# Patient Record
Sex: Female | Born: 1955 | Race: White | Hispanic: No | Smoking: Never smoker
Health system: Southern US, Community
[De-identification: ages and names within clinical notes are randomized; demographics above are authoritative.]

## PROBLEM LIST (undated history)

## (undated) DIAGNOSIS — K219 Gastro-esophageal reflux disease without esophagitis: Secondary | ICD-10-CM

## (undated) DIAGNOSIS — A4902 Methicillin resistant Staphylococcus aureus infection, unspecified site: Secondary | ICD-10-CM

## (undated) DIAGNOSIS — J3800 Paralysis of vocal cords and larynx, unspecified: Secondary | ICD-10-CM

## (undated) DIAGNOSIS — I4729 Other ventricular tachycardia: Secondary | ICD-10-CM

## (undated) DIAGNOSIS — I442 Atrioventricular block, complete: Secondary | ICD-10-CM

## (undated) DIAGNOSIS — J45909 Unspecified asthma, uncomplicated: Secondary | ICD-10-CM

## (undated) DIAGNOSIS — N760 Acute vaginitis: Secondary | ICD-10-CM

## (undated) DIAGNOSIS — Z950 Presence of cardiac pacemaker: Secondary | ICD-10-CM

## (undated) DIAGNOSIS — I472 Ventricular tachycardia: Secondary | ICD-10-CM

## (undated) HISTORY — DX: Gastro-esophageal reflux disease without esophagitis: K21.9

## (undated) HISTORY — DX: Unspecified asthma, uncomplicated: J45.909

## (undated) HISTORY — DX: Acute vaginitis: N76.0

## (undated) HISTORY — DX: Other ventricular tachycardia: I47.29

## (undated) HISTORY — DX: Ventricular tachycardia: I47.2

## (undated) HISTORY — PX: FRACTURE SURGERY: SHX138

## (undated) HISTORY — DX: Paralysis of vocal cords and larynx, unspecified: J38.00

## (undated) HISTORY — PX: HAND SURGERY: SHX662

## (undated) HISTORY — DX: Presence of cardiac pacemaker: Z95.0

## (undated) HISTORY — DX: Atrioventricular block, complete: I44.2

## (undated) HISTORY — PX: COLON SURGERY: SHX602

---

## 1976-04-03 HISTORY — PX: DILATION AND CURETTAGE OF UTERUS: SHX78

## 1998-10-15 ENCOUNTER — Ambulatory Visit (HOSPITAL_BASED_OUTPATIENT_CLINIC_OR_DEPARTMENT_OTHER): Admission: RE | Admit: 1998-10-15 | Discharge: 1998-10-15 | Payer: Self-pay | Admitting: Orthopedic Surgery

## 1999-06-21 ENCOUNTER — Encounter: Payer: Self-pay | Admitting: Obstetrics and Gynecology

## 1999-06-21 ENCOUNTER — Encounter: Admission: RE | Admit: 1999-06-21 | Discharge: 1999-06-21 | Payer: Self-pay | Admitting: Obstetrics and Gynecology

## 2000-06-06 ENCOUNTER — Other Ambulatory Visit: Admission: RE | Admit: 2000-06-06 | Discharge: 2000-06-06 | Payer: Self-pay | Admitting: Obstetrics and Gynecology

## 2000-11-21 ENCOUNTER — Other Ambulatory Visit: Admission: RE | Admit: 2000-11-21 | Discharge: 2000-11-21 | Payer: Self-pay | Admitting: Obstetrics and Gynecology

## 2000-11-30 ENCOUNTER — Encounter: Payer: Self-pay | Admitting: Obstetrics and Gynecology

## 2000-11-30 ENCOUNTER — Encounter: Admission: RE | Admit: 2000-11-30 | Discharge: 2000-11-30 | Payer: Self-pay | Admitting: Obstetrics and Gynecology

## 2001-07-22 ENCOUNTER — Other Ambulatory Visit: Admission: RE | Admit: 2001-07-22 | Discharge: 2001-07-22 | Payer: Self-pay | Admitting: Obstetrics and Gynecology

## 2002-08-13 ENCOUNTER — Other Ambulatory Visit: Admission: RE | Admit: 2002-08-13 | Discharge: 2002-08-13 | Payer: Self-pay | Admitting: Obstetrics and Gynecology

## 2003-04-04 HISTORY — PX: OTHER SURGICAL HISTORY: SHX169

## 2003-09-22 ENCOUNTER — Other Ambulatory Visit: Admission: RE | Admit: 2003-09-22 | Discharge: 2003-09-22 | Payer: Self-pay | Admitting: Obstetrics and Gynecology

## 2004-04-19 ENCOUNTER — Ambulatory Visit: Payer: Self-pay | Admitting: Internal Medicine

## 2004-06-29 ENCOUNTER — Other Ambulatory Visit: Admission: RE | Admit: 2004-06-29 | Discharge: 2004-06-29 | Payer: Self-pay | Admitting: Obstetrics and Gynecology

## 2004-08-26 ENCOUNTER — Ambulatory Visit: Payer: Self-pay | Admitting: Internal Medicine

## 2004-10-03 ENCOUNTER — Ambulatory Visit: Payer: Self-pay | Admitting: Internal Medicine

## 2004-12-12 ENCOUNTER — Other Ambulatory Visit: Admission: RE | Admit: 2004-12-12 | Discharge: 2004-12-12 | Payer: Self-pay | Admitting: Obstetrics and Gynecology

## 2005-01-13 ENCOUNTER — Ambulatory Visit: Payer: Self-pay | Admitting: Internal Medicine

## 2005-06-02 ENCOUNTER — Ambulatory Visit: Payer: Self-pay | Admitting: Internal Medicine

## 2005-07-27 ENCOUNTER — Ambulatory Visit: Payer: Self-pay | Admitting: Internal Medicine

## 2005-10-06 ENCOUNTER — Ambulatory Visit: Payer: Self-pay | Admitting: Internal Medicine

## 2005-12-07 ENCOUNTER — Ambulatory Visit: Payer: Self-pay | Admitting: Internal Medicine

## 2006-04-27 ENCOUNTER — Ambulatory Visit: Payer: Self-pay | Admitting: Internal Medicine

## 2006-09-05 ENCOUNTER — Ambulatory Visit: Payer: Self-pay | Admitting: Internal Medicine

## 2006-10-30 ENCOUNTER — Ambulatory Visit: Payer: Self-pay | Admitting: Internal Medicine

## 2007-02-06 ENCOUNTER — Ambulatory Visit: Payer: Self-pay | Admitting: Internal Medicine

## 2007-06-05 DIAGNOSIS — J209 Acute bronchitis, unspecified: Secondary | ICD-10-CM | POA: Insufficient documentation

## 2007-06-05 DIAGNOSIS — K219 Gastro-esophageal reflux disease without esophagitis: Secondary | ICD-10-CM

## 2007-06-05 DIAGNOSIS — J3800 Paralysis of vocal cords and larynx, unspecified: Secondary | ICD-10-CM

## 2007-06-05 DIAGNOSIS — J302 Other seasonal allergic rhinitis: Secondary | ICD-10-CM | POA: Insufficient documentation

## 2007-06-06 ENCOUNTER — Ambulatory Visit: Payer: Self-pay | Admitting: Internal Medicine

## 2007-09-30 ENCOUNTER — Encounter: Payer: Self-pay | Admitting: Infectious Disease

## 2007-10-02 ENCOUNTER — Encounter: Admission: RE | Admit: 2007-10-02 | Discharge: 2007-10-02 | Payer: Self-pay | Admitting: Infectious Disease

## 2007-10-02 ENCOUNTER — Ambulatory Visit: Payer: Self-pay | Admitting: Infectious Disease

## 2007-10-02 DIAGNOSIS — N760 Acute vaginitis: Secondary | ICD-10-CM | POA: Insufficient documentation

## 2007-10-02 DIAGNOSIS — B3781 Candidal esophagitis: Secondary | ICD-10-CM | POA: Insufficient documentation

## 2007-10-02 DIAGNOSIS — N898 Other specified noninflammatory disorders of vagina: Secondary | ICD-10-CM | POA: Insufficient documentation

## 2007-10-02 LAB — CONVERTED CEMR LAB
ALT: 28 units/L (ref 0–35)
AST: 26 units/L (ref 0–37)
Albumin: 4.2 g/dL (ref 3.5–5.2)
Alkaline Phosphatase: 89 units/L (ref 39–117)
BUN: 20 mg/dL (ref 6–23)
C3 Complement: 147 mg/dL (ref 88–201)
CO2: 27 meq/L (ref 19–32)
Calcium: 9.7 mg/dL (ref 8.4–10.5)
Chlamydia, DNA Probe: NEGATIVE
Chloride: 104 meq/L (ref 96–112)
Compl, Total (CH50): 61 (ref 31–66)
Complement C4, Body Fluid: 21 mg/dL (ref 16–47)
Creatinine, Ser: 1 mg/dL (ref 0.40–1.20)
GC Probe Amp, Genital: NEGATIVE
Glucose, Bld: 90 mg/dL (ref 70–99)
IgA: 303 mg/dL (ref 68–378)
IgE (Immunoglobulin E), Serum: 38.6 intl units/mL (ref 0.0–180.0)
IgG (Immunoglobin G), Serum: 1400 mg/dL (ref 694–1618)
IgM, Serum: 114 mg/dL (ref 60–263)
Potassium: 4.3 meq/L (ref 3.5–5.3)
Sodium: 140 meq/L (ref 135–145)
Total Bilirubin: 0.3 mg/dL (ref 0.3–1.2)
Total Protein: 7.5 g/dL (ref 6.0–8.3)

## 2007-10-07 LAB — CONVERTED CEMR LAB
Candida species: NEGATIVE
Gardnerella vaginalis: POSITIVE — AB
Trichomonal Vaginitis: NEGATIVE

## 2007-11-27 ENCOUNTER — Ambulatory Visit: Payer: Self-pay | Admitting: Infectious Disease

## 2007-11-28 ENCOUNTER — Encounter: Payer: Self-pay | Admitting: Infectious Disease

## 2007-11-28 LAB — CONVERTED CEMR LAB
Candida species: NEGATIVE
Trichomonal Vaginitis: NEGATIVE

## 2007-12-26 ENCOUNTER — Ambulatory Visit: Payer: Self-pay | Admitting: Internal Medicine

## 2008-03-11 ENCOUNTER — Encounter: Payer: Self-pay | Admitting: Internal Medicine

## 2008-07-09 ENCOUNTER — Ambulatory Visit: Payer: Self-pay | Admitting: Internal Medicine

## 2008-08-03 ENCOUNTER — Encounter: Admission: RE | Admit: 2008-08-03 | Discharge: 2008-08-03 | Payer: Self-pay | Admitting: Cardiology

## 2008-08-05 ENCOUNTER — Observation Stay (HOSPITAL_COMMUNITY): Admission: RE | Admit: 2008-08-05 | Discharge: 2008-08-06 | Payer: Self-pay | Admitting: Cardiology

## 2008-08-05 DIAGNOSIS — Z950 Presence of cardiac pacemaker: Secondary | ICD-10-CM

## 2008-08-05 HISTORY — DX: Presence of cardiac pacemaker: Z95.0

## 2008-08-05 HISTORY — PX: PACEMAKER INSERTION: SHX728

## 2009-09-10 ENCOUNTER — Ambulatory Visit: Payer: Self-pay | Admitting: Internal Medicine

## 2009-12-28 ENCOUNTER — Ambulatory Visit: Payer: Self-pay | Admitting: Internal Medicine

## 2009-12-28 DIAGNOSIS — J018 Other acute sinusitis: Secondary | ICD-10-CM | POA: Insufficient documentation

## 2010-05-03 NOTE — Assessment & Plan Note (Signed)
Summary: rov/jd   Primary Provider/Referring Provider:  Aida Puffer  CC:  Follow up visit-Increased cough in mornings-yellow/greenish in color;SOB/wheezing and fever and chills..  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 Delford Field. Still teraches and tries to sing some in choir. Minor seasonal flare of wheeze. Switch to symbicort worked ok for asthma but didn't help hoarseness, Occasional sinus headache. Denies bloody, purulent or painfull dischrge, itching or sneeze.  07/09/08- Allergic rhinitis, asthmatic bronchitis, GERD, vocal cord paresis Doing well despite heavy pollen counts currently. No longer on allergy vaccine.  She has her routine meds and feels adequately controlled. Some sniffing  but denies wheeze or chest tightness.  September 10, 2009- Allergic rhinitis, asthmatic bronchtiis, GERD, vocal cord paresis Since April has been more easily dyspneic without wheeze, nonproductive dry cough. Not much nose or sinus trouble. Rescue inhaler does help, used 0-2x/day. may happen a little more at school than at home. Somedays feels very well. Other days rapid onset of coughing paroxysms. She realizes vulnerability to aspiration and thinks she can tell when that is the issue. Lemon drops help. Symbicort helps- needs refill. Had 2 respiratory infections in the past year- normal adult pattern.  December 28, 2009- Allergic rhinitis, asthmatic bronchitis, GFERD, vocal cord paresis. Got a sinus infection about 5 weeks ago. She got a 3 day Z pak which seemed to clear the worst of it, but she still gets headaches, harsh cough, hoarseness. Cough is productive yellow green in early morning then clears through the day. It has been a big help for her cords that she uses a spacer with her Symbicort. Occasional wheezey cough with dyspnea but not much real wheeze. Had fever at the onset.    Preventive Screening-Counseling &  Management  Alcohol-Tobacco     Smoking Status: never  Current Medications (verified): 1)  Flonase 50 Mcg/act  Susp (Fluticasone Propionate) .... Use 1 Spray in Each Nostril Once Daily 2)  Symbicort 160-4.5 Mcg/act  Aero (Budesonide-Formoterol Fumarate) .... Inhale 2 Puffs Two Times A Day 3)  Proair Hfa 108 (90 Base) Mcg/act Aers (Albuterol Sulfate) .... 2 Puffs Four Times A Day As Needed 4)  Calcium 500/d 500-200 Mg-Unit  Tabs (Calcium Carbonate-Vitamin D) .... Take 1 Tablet By Mouth Once A Day 5)  Prozac 10 Mg  Caps (Fluoxetine Hcl) .... Take 1 Tablet By Mouth Once A Day 6)  Zyrtec Allergy 10 Mg  Tabs (Cetirizine Hcl) .... Take 1 Tablet By Mouth Once A Day 7)  Toprol Xl 25 Mg Xr24h-Tab (Metoprolol Succinate) .... Take 1 Tablet By Mouth Once A Day 8)  Benzonatate 200 Mg Caps (Benzonatate) .Marland Kitchen.. 1 Three Times A Day As Needed Cough  Allergies (verified): 1)  ! Erythromycin  Past History:  Past Medical History: Last updated: 10/02/2007 Asthmatic bronchitis Allergic rhinitis- ended allergy vaccine 11/08 Recurrent vaginitis Two episodes of vocal cord candidal infection in 2 and 3/09 treated by Dr. 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  The patient denies shortness of breath with activity, shortness of breath at rest, coughing up blood, chest pain, irregular heartbeats, acid heartburn, indigestion, loss of appetite, weight change, abdominal pain, difficulty swallowing, sore throat, tooth/dental problems, sneezing, anxiety, depression, rash, and fever.    Vital Signs:  Patient profile:   55 year old female Height:      67 inches Weight:      141.13 pounds BMI:     22.18 O2 Sat:      100 % on Room air Temp:     97.3 degrees F oral Pulse rate:   67 / minute BP sitting:   138 / 60  (right arm) Cuff size:   regular  Vitals Entered By: Reynaldo Minium CMA (December 28, 2009 9:31 AM)  O2 Flow:  Room air CC: Follow up visit-Increased cough in mornings-yellow/greenish in color;SOB/wheezing,fever and chills.   Physical Exam  Additional Exam:  General: A/Ox3; pleasant and cooperative, NAD, SKIN: no rash, lesions NODES: no lymphadenopathy HEENT: [place]/AT, EOM- WNL, Conjuctivae- clear, PERRLA, TM-WNL, Nose- clear, sniffing , Throat- clear, vocal quality is hoarse without stridor. No postnasal drip seen, Mallampati  II NECK: Supple w/ fair ROM, JVD- none, normal carotid impulses w/o bruits Thyroid-  CHEST: Clear to P&A, coughing, no wheeze HEART: RRR, no m/g/r heard ABDOMEN: trim EXB:MWUX, nl pulses, no edema  NEURO: Grossly intact to observation      Impression & Recommendations:  Problem # 1:  RHINOSINUSITIS, ACUTE (ICD-461.8)  I don' t think the initial antibiotic trial was sufficient to clear her sinusitis. Jannette Fogo may be an ongoing seasonal allergic rhinitis as well, but I will trry a longer antibiotic course. Her updated medication list for this problem includes:    Flonase 50 Mcg/act Susp (Fluticasone propionate) ..... Use 1 spray in each nostril once daily    Benzonatate 200 Mg Caps (Benzonatate) .Marland Kitchen... 1 three times a day as needed cough    Augmentin 875-125 Mg Tabs (Amoxicillin-pot  clavulanate) .Marland Kitchen... 1 two times a day  Problem # 2:  ASTHMATIC BRONCHITIS, ACUTE (ICD-466.0)  This is related and probably sustained by her sinusitis and the same treatment should be sufficient. Benzonate seems to be helpful. Her updated medication list for this problem includes:    Symbicort 160-4.5 Mcg/act Aero (Budesonide-formoterol fumarate) ..... Inhale 2 puffs two times a day    Proair Hfa 108 (90 Base) Mcg/act Aers (Albuterol sulfate) .Marland Kitchen... 2 puffs four times a day as needed    Benzonatate 200 Mg Caps (Benzonatate) .Marland Kitchen... 1 three times a day as needed cough    Augmentin 875-125 Mg Tabs (Amoxicillin-pot clavulanate) .Marland Kitchen... 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 She recognizes increased risk of aspiration/ laryngeal penetration.   Medications Added to Medication List This Visit: 1)  Augmentin 875-125 Mg Tabs (Amoxicillin-pot clavulanate) .Marland Kitchen.. 1 two times a day  Other Orders: Est. Patient Level IV (32440)  Patient Instructions: 1)  Please schedule a follow-up appointment in 6 months. 2)  Script sent for generic augmentin. Throat lozenges, lots of fluids and your benzonatate may also help. Prescriptions:  AUGMENTIN 875-125 MG TABS (AMOXICILLIN-POT CLAVULANATE) 1 two times a day  #14 x 1   Entered and Authorized by:   Waymon Budge MD   Signed by:   Waymon Budge MD on 12/28/2009   Method used:   Electronically to        [facility]* (retail)       [address] [place], [place]  [address]       Ph: [phone] or [phone]       Fax: [phone]   RxID:   [id number]

## 2010-05-03 NOTE — Assessment & Plan Note (Signed)
Summary: rov/mbw   Primary Provider/Referring Provider:  Aida Puffer  CC:  asthma flare w/ dry cough and increased use of rescue inhaler since March w/ spring.  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 Delford Field. Still teraches and tries to sing some in choir. Minor seasonal flare of wheeze. Switch to symbicort worked ok for asthma but didn't help hoarseness, Occasional sinus headache. Denies bloody, purulent or painfull dischrge, itching or sneeze.  07/09/08- Allergic rhinitis, asthmatic bronchitis, GERD, vocal cord paresis Doing well despite heavy pollen counts currently. No longer on allergy vaccine.  She has her routine meds and feels adequately controlled. Some sniffing  but denies wheeze or chest tightness.  September 10, 2009- Allergic rhinitis, asthmatic bronchtiis, GERD, vocal cord paresis Since April has been more easily dyspneic without wheeze, nonproductive dry cough. Not much nose or sinus trouble. Rescue inhaler does help, used 0-2x/day. may happen a little more at school than at home. Somedays feels very well. Other days rapid onset of coughing paroxysms. She realizes vulnerability to aspiration and thinks she can tell when that is the issue. Lemon drops help. Symbicort helps- needs refill. Had 2 respiratory infections in the past year- normal adult pattern.    Preventive Screening-Counseling & Management  Alcohol-Tobacco     Smoking Status: never  Current Medications (verified): 1)  Flonase 50 Mcg/act  Susp (Fluticasone Propionate) .... Use 1 Spray in Each Nostril Once Daily 2)  Symbicort 160-4.5 Mcg/act  Aero (Budesonide-Formoterol Fumarate) .... Inhale 2 Puffs Two Times A Day 3)  Proair Hfa 108 (90 Base) Mcg/act Aers (Albuterol Sulfate) .... 2 Puffs Four Times A Day As Needed 4)  Calcium 500/d 500-200 Mg-Unit  Tabs (Calcium Carbonate-Vitamin D) .... Take 1 Tablet By Mouth Once A Day 5)   Multivitamins   Tabs (Multiple Vitamin) .... Take 1 Tablet By Mouth Once A Day 6)  Prozac 10 Mg  Caps (Fluoxetine Hcl) .... Take 1 Tablet By Mouth Once A Day 7)  Zyrtec Allergy 10 Mg  Tabs (Cetirizine Hcl) .... Take 1 Tablet By Mouth Once A Day 8)  Toprol Xl 25 Mg Xr24h-Tab (Metoprolol Succinate) .... Take 1 Tablet By Mouth Once A Day  Allergies: 1)  ! Erythromycin  Past History:  Past Medical History: Last updated: 10/02/2007 Asthmatic bronchitis Allergic rhinitis- ended allergy vaccine 11/08 Recurrent vaginitis Two episodes of vocal cord candidal infection in 2 and 3/09 treated by Dr. 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  The patient denies shortness of breath with activity, shortness of breath at rest, productive cough, coughing up blood, chest pain, irregular heartbeats, loss of appetite, weight change, abdominal pain, difficulty swallowing, sore throat, tooth/dental problems, headaches, nasal congestion/difficulty  breathing through nose, and sneezing.    Vital Signs:  Patient profile:   55 year old female Height:      67 inches Weight:      144 pounds BMI:     22.64 O2 Sat:      98 % on Room air Pulse rate:   60 / minute BP sitting:   104 / 64  (left arm) Cuff  size:   regular  Vitals Entered By: Boone Master CNA/MA (September 10, 2009 4:11 PM)  O2 Flow:  Room air CC: asthma flare w/ dry cough and increased use of rescue inhaler since March w/ spring Comments Medications reviewed with patient Daytime contact number verified with patient. Boone Master CNA/MA  September 10, 2009 4:11 PM    Physical Exam  Additional Exam:  General: A/Ox3; pleasant and cooperative, NAD, SKIN: no rash, lesions NODES: no lymphadenopathy HEENT: [place]/AT, EOM- WNL, Conjuctivae- clear, PERRLA, TM-WNL, Nose- clear , Throat- clear, vocal quality is hoarse without stridor. No postnasal drip seen, Mallampati  II NECK: Supple w/ fair ROM, JVD- none, normal carotid impulses w/o bruits Thyroid-  CHEST: Clear to P&A HEART: RRR, no m/g/r heard ABDOMEN:  ZOX:WRUE, nl pulses, no edema  NEURO: Grossly intact to observation      Impression & Recommendations:  Problem # 1:  ASTHMATIC BRONCHITIS, ACUTE (ICD-466.0)  Recurrent cough, likely with components of LPR and air quality irritation. We will give benzonatate. She wil contiue reflux precautions and her bonchodilator meds. She relaizes this school year was unusually stresfull and that contributes. The following medications were removed from the medication list:    Metronidazole 500 Mg Tabs (Metronidazole) .Marland Kitchen..Marland Kitchen Two times a day for 7 days Her updated medication list for this problem includes:    Symbicort 160-4.5 Mcg/act Aero (Budesonide-formoterol fumarate) ..... Inhale 2 puffs two times a day    Proair Hfa 108 (90 Base) Mcg/act Aers (Albuterol sulfate) .Marland Kitchen... 2 puffs four times a day as needed    Benzonatate 200 Mg Caps (Benzonatate) .Marland Kitchen... 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 This carries increased concerns for aspiration and impaired cough protection of the airway.  Problem # 3:  ALLERGIC RHINITIS (ICD-477.9) Assessment: Comment Only  Her  updated medication list for this problem includes:    Flonase 50 Mcg/act Susp (Fluticasone propionate) ..... Use 1 spray in each nostril once daily    Zyrtec Allergy 10 Mg Tabs (Cetirizine hcl) .Marland Kitchen... Take 1 tablet by mouth once a day  Medications Added to Medication List This Visit: 1)  Toprol Xl 25 Mg Xr24h-tab (Metoprolol succinate) .... Take 1 tablet by mouth once a day 2)  Benzonatate 200 Mg Caps (Benzonatate) .Marland Kitchen.. 1 three times a day as needed cough  Other Orders: Est. Patient Level III (45409)  Patient Instructions: 1)  Please schedule a follow-up appointment in 1 year. 2)  Script for benzonatate perles for cough if needed 3)  Scripts sent for [employer] and Symbicort Prescriptions: BENZONATATE 200 MG CAPS (BENZONATATE) 1 three times a day as needed cough  #30 x prn   Entered and Authorized by:   Waymon Budge MD   Signed by:   Waymon Budge MD on 09/10/2009   Method used:   Electronically to        [facility]* (retail)       [address]  [address] [place], [place]  [address]       Ph: [phone] or [phone]       Fax: [phone]   RxID:   [id number] PROAIR HFA 108 (90 BASE) MCG/ACT AERS (ALBUTEROL SULFATE) 2 puffs four times a day as needed  #1 x prn   Entered and Authorized by:   Waymon Budge MD   Signed by:   Waymon Budge MD on 09/10/2009   Method used:   Electronically to        [facility]* (retail)       [address] [place], [place]  [address]       Ph: [phone] or [phone]       Fax: [phone]   RxID:   [id number] SYMBICORT 160-4.5 MCG/ACT  AERO (BUDESONIDE-FORMOTEROL FUMARATE) Inhale 2 puffs two times a day  #1 x prn   Entered and Authorized by:   Waymon Budge MD   Signed by:   Waymon Budge MD on 09/10/2009   Method used:   Electronically to        [facility]* (retail)       [address] [place], [place]  [address]       Ph: [phone] or [phone]       Fax: [phone]   RxID:   [id number] PROAIR HFA 108 (90 BASE) MCG/ACT AERS (ALBUTEROL SULFATE) 2 puffs four times a day as needed  #1 x prn   Entered by:   Waymon Budge MD   Authorized by:   Acey Lav MD   Signed by:   Waymon Budge MD on 09/10/2009   Method used:   Electronically to        [facility]* (retail)       [address] [place], [place]  [address]       Ph: [phone] or [phone]       Fax: [phone]   RxID:   [id number] SYMBICORT 160-4.5 MCG/ACT  AERO (BUDESONIDE-FORMOTEROL FUMARATE) Inhale 2 puffs two times a day  #1 x prn   Entered by:   Waymon Budge MD   Authorized by:   Acey Lav MD   Signed by:   Waymon Budge MD on 09/10/2009   Method used:   Electronically to        [facility]* (retail)       [address] [place], [place]  [address]       Ph: [phone] or [phone]       Fax: [phone]   RxID:   [id number] BENZONATATE 200 MG CAPS (BENZONATATE) 1 three times a day as needed cough  #30 x prn   Entered by:   Waymon Budge MD   Authorized by:   Acey Lav MD   Signed by:   Waymon Budge MD on 09/10/2009   Method used:   Print then Give to Patient   RxID:   [id number]  Immunization History:  Influenza Immunization History:    Influenza:  historical (01/01/2009)  Pneumovax Immunization History:    Pneumovax:  historical (04/03/2005)

## 2010-05-04 HISTORY — PX: APPENDECTOMY: SHX54

## 2010-05-19 ENCOUNTER — Ambulatory Visit (INDEPENDENT_AMBULATORY_CARE_PROVIDER_SITE_OTHER): Payer: BC Managed Care – PPO | Admitting: Internal Medicine

## 2010-05-19 ENCOUNTER — Encounter: Payer: Self-pay | Admitting: Internal Medicine

## 2010-05-19 DIAGNOSIS — J018 Other acute sinusitis: Secondary | ICD-10-CM

## 2010-05-19 DIAGNOSIS — J309 Allergic rhinitis, unspecified: Secondary | ICD-10-CM

## 2010-05-25 NOTE — Assessment & Plan Note (Signed)
Summary: sinus congestion ///kp   Primary Provider/Referring Provider:  Aida Puffer  CC:  Acute visit-sinus congestion since end of Jan; green in color and occasional headaches..  History of Present Illness: September 10, 2009- Allergic rhinitis, asthmatic bronchtiis, GERD, vocal cord paresis Since April has been more easily dyspneic without wheeze, nonproductive dry cough. Not much nose or sinus trouble. Rescue inhaler does help, used 0-2x/day. may happen a little more at school than at home. Somedays feels very well. Other days rapid onset of coughing paroxysms. She realizes vulnerability to aspiration and thinks she can tell when that is the issue. Lemon drops help. Symbicort helps- needs refill. Had 2 respiratory infections in the past year- normal adult pattern.  December 28, 2009- Allergic rhinitis, asthmatic bronchitis, GERD, vocal cord paresis. Got a sinus infection about 5 weeks ago. She got a 3 day Z pak which seemed to clear the worst of it, but she still gets headaches, harsh cough, hoarseness. Cough is productive yellow green in early morning then clears through the day. It has been a big help for her cords that she uses a spacer with her Symbicort. Occasional wheezey cough with dyspnea but not much real wheeze. Had fever at the onset.   May 19, 2010- Allergic rhinitis, asthmatic bronchitis, GERD, vocal cord paresis, CHB/AFib/pacemaker Nurse-CC: Acute visit-sinus congestion since end of Jan; green in color and occasional headaches. Allergi rhinitis- Sinus congestion, green and yellow x 2 weeks, No headache. Dry scratchy throat, global headache. No fever.  Asthma- More "heavy" feeling in the chest  and needed rescue inhaler several times last week. Not actively wheezing. GERD- not aware of active reflux events recently. Vocal cord paresis/splint    Preventive Screening-Counseling & Management  Alcohol-Tobacco     Smoking Status: never  Current Medications (verified): 1)   Flonase 50 Mcg/act  Susp (Fluticasone Propionate) .... Use 1 Spray in Each Nostril Once Daily 2)  Symbicort 160-4.5 Mcg/act  Aero (Budesonide-Formoterol Fumarate) .... Inhale 2 Puffs Two Times A Day 3)  Proair Hfa 108 (90 Base) Mcg/act Aers (Albuterol Sulfate) .... 2 Puffs Four Times A Day As Needed 4)  Calcium 500/d 500-200 Mg-Unit  Tabs (Calcium Carbonate-Vitamin D) .... Take 1 Tablet By Mouth Once A Day 5)  Prozac 10 Mg  Caps (Fluoxetine Hcl) .... Take 1 Tablet By Mouth Once A Day 6)  Zyrtec Allergy 10 Mg  Tabs (Cetirizine Hcl) .... Take 1 Tablet By Mouth Once A Day 7)  Toprol Xl 25 Mg Xr24h-Tab (Metoprolol Succinate) .... Take 1 Tablet By Mouth Once A Day 8)  Benzonatate 200 Mg Caps (Benzonatate) .Marland Kitchen.. 1 Three Times A Day As Needed Cough  Allergies (verified): 1)  ! Erythromycin  Past History:  Past Medical History: Last updated: 10/02/2007 Asthmatic bronchitis Allergic rhinitis- ended allergy vaccine 11/08 Recurrent vaginitis Two episodes of vocal cord candidal infection in 2 and 3/09 treated by Dr. Delford Field at [facility] Vocal cord paresis G E R D  Family History: Last updated: 01/17/2008 Mom with HTN, recurrent DVTs, Dad with CAD in 76s, VF with pacemaker, defibrillator  Mother- deceased age 85; cerb hem, blood clots, HBP Father- living age 19; pacemaker, ACID Implant, HBP, heart disease, DM, Blood Clots Sibling 1- living age 84; allergies, asthma  Social History: Last updated: 10/02/2007 Patient never smoked.  teachers assisitant Married, not sexually active, HIV test pending from OB  Risk Factors: Smoking Status: never (05/19/2010)  Past Surgical History: vocal cord splint 2005- Dr Mora Appl [facility] 1998 hand surgery  in 2000 Pacemaker  Review of Systems      See HPI       The patient complains of shortness of breath with activity and nasal congestion/difficulty breathing through nose.  The patient denies shortness of breath at rest, productive cough,  non-productive cough, coughing up blood, chest pain, irregular heartbeats, acid heartburn, indigestion, loss of appetite, weight change, abdominal pain, difficulty swallowing, sore throat, tooth/dental problems, headaches, and sneezing.    Vital Signs:  Patient profile:   55 year old female Height:      67 inches Weight:      144.50 pounds O2 Sat:      100 % on Room air Pulse rate:   59 / minute BP sitting:   112 / 68  (left arm) Cuff size:   regular  Vitals Entered By: Reynaldo Minium CMA (May 19, 2010 10:04 AM)  O2 Flow:  Room air CC: Acute visit-sinus congestion since end of Jan; green in color and occasional headaches.   Physical Exam  Additional Exam:  General: A/Ox3; pleasant and cooperative, NAD, SKIN: no rash, lesions NODES: no lymphadenopathy HEENT: [place]/AT, EOM- WNL, Conjuctivae- clear, PERRLA, TM-WNL, Nose- stuffy, sniffing , Throat- clear, vocal quality is hoarse without stridor. No postnasal drip seen, Mallampati  II NECK: Supple w/ fair ROM, JVD- none, normal carotid impulses w/o bruits Thyroid-  CHEST: Clear to P&A, coughing, no wheeze HEART: RRR, no m/g/r heard ABDOMEN: trim JXB:JYNW, nl pulses, no edema  NEURO: Grossly intact to observation      Impression & Recommendations:  Problem # 1:  RHINOSINUSITIS, ACUTE (ICD-461.8)  Acute rhinosinusitis- probably infection but we discussed early pollen season . We discussed and will give neb, depo and cefdinir. The following medications were removed from the medication list:    Augmentin 875-125 Mg Tabs (Amoxicillin-pot clavulanate) .Marland Kitchen... 1 two times a day Her updated medication list for this problem includes:    Flonase 50 Mcg/act Susp (Fluticasone propionate) ..... Use 1 spray in each nostril once daily    Benzonatate 200 Mg Caps (Benzonatate) .Marland Kitchen... 1 three times a day as needed cough    Cefdinir 300 Mg Caps (Cefdinir) .Marland Kitchen... 2 daily x 7 days  Problem # 2:  ASTHMATIC BRONCHITIS, ACUTE (ICD-466.0) She  responded appropriately last week with rescue inhaler use, but that seems tohave settled down.  The following medications were removed from the medication list:    Augmentin 875-125 Mg Tabs (Amoxicillin-pot clavulanate) .Marland Kitchen... 1 two times a day Her updated medication list for this problem includes:    Symbicort 160-4.5 Mcg/act Aero (Budesonide-formoterol fumarate) ..... Inhale 2 puffs two times a day    Proair Hfa 108 (90 Base) Mcg/act Aers (Albuterol sulfate) .Marland Kitchen... 2 puffs four times a day as needed    Benzonatate 200 Mg Caps (Benzonatate) .Marland Kitchen... 1 three times a day as needed cough    Cefdinir 300 Mg Caps (Cefdinir) .Marland Kitchen... 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 She is always a little hoarse, and worse with coughing. This recent illness fits that patten.She follows with the [facility] at [facility]  Medications Added to Medication List This Visit: 1)  Cefdinir 300 Mg Caps (Cefdinir) .... 2 daily x 7 days  Other Orders: Est. Patient Level III (29562) Admin of Therapeutic Inj  intramuscular or subcutaneous (13086) Depo- Medrol 80mg  (J1040) Nebulizer Tx (57846)  Patient Instructions: 1)  Please schedule a follow-up appointment in 6 months. Please call sooner  as needed. 2)  Script sent for cefdinir antibiotic 3)  Continue saline sinus rinse as needed 4)  Consider a decongestant to open stuffy nose as needed- 5)      phenylephrine (-PE, like Sudafed-PE) 6)  Neb neo nasal 7)  depo 80 Prescriptions: CEFDINIR 300 MG CAPS (CEFDINIR) 2 daily x 7 days  #14 x 2   Entered and Authorized by:   Waymon Budge MD   Signed by:   Waymon Budge MD on 05/19/2010   Method used:   Electronically to        [facility]* (retail)       [address] [place], [place]  [address]       Ph: [phone] or [phone]       Fax: [phone]   RxID:   [id number]      Medication  Administration  Injection # 1:    Medication: Depo- Medrol 80mg     Diagnosis: RHINOSINUSITIS, ACUTE (ICD-461.8)    Route: SQ    Site: LUOQ gluteus    Exp Date: 10/2012    Lot #: [id number]    Mfr: Pharmacia    Patient tolerated injection without complications    Given by: Reynaldo Minium CMA (May 19, 2010 10:45 AM)  Medication # 1:    Medication: EMR miscellaneous medications    Diagnosis: RHINOSINUSITIS, ACUTE (ICD-461.8)    Dose: 3 drops    Route: intranasal    Exp Date: 01-2012    Lot #: [id number]    Mfr: Novartis    Comments: 4 way fast acting    Patient tolerated medication without complications    Given by: Reynaldo Minium CMA (May 19, 2010 10:47 AM)  Orders Added: 1)  Est. Patient Level III [40347] 2)  Admin of Therapeutic Inj  intramuscular or subcutaneous [96372] 3)  Depo- Medrol 80mg  [J1040] 4)  Nebulizer Tx [42595]

## 2010-05-29 ENCOUNTER — Other Ambulatory Visit: Payer: Self-pay | Admitting: Surgery

## 2010-05-29 ENCOUNTER — Inpatient Hospital Stay (HOSPITAL_COMMUNITY)
Admission: EM | Admit: 2010-05-29 | Discharge: 2010-05-31 | DRG: 883 | Disposition: A | Discharge status: Home / Self Care | Payer: BC Managed Care – PPO | Attending: Surgery | Admitting: Surgery

## 2010-05-29 ENCOUNTER — Other Ambulatory Visit (HOSPITAL_COMMUNITY): Payer: BC Managed Care – PPO

## 2010-05-29 ENCOUNTER — Other Ambulatory Visit: Payer: Self-pay | Admitting: Family Medicine

## 2010-05-29 ENCOUNTER — Ambulatory Visit (HOSPITAL_COMMUNITY)
Admission: RE | Admit: 2010-05-29 | Discharge: 2010-05-29 | Disposition: A | Discharge status: Home / Self Care | Payer: BC Managed Care – PPO | Source: Ambulatory Visit | Attending: Family Medicine | Admitting: Family Medicine

## 2010-05-29 ENCOUNTER — Emergency Department (HOSPITAL_COMMUNITY): Payer: BC Managed Care – PPO

## 2010-05-29 DIAGNOSIS — K37 Unspecified appendicitis: Secondary | ICD-10-CM

## 2010-05-29 DIAGNOSIS — R1031 Right lower quadrant pain: Secondary | ICD-10-CM | POA: Insufficient documentation

## 2010-05-29 DIAGNOSIS — I442 Atrioventricular block, complete: Secondary | ICD-10-CM | POA: Diagnosis present

## 2010-05-29 DIAGNOSIS — J45909 Unspecified asthma, uncomplicated: Secondary | ICD-10-CM | POA: Diagnosis present

## 2010-05-29 DIAGNOSIS — Z9049 Acquired absence of other specified parts of digestive tract: Secondary | ICD-10-CM

## 2010-05-29 DIAGNOSIS — D72829 Elevated white blood cell count, unspecified: Secondary | ICD-10-CM | POA: Insufficient documentation

## 2010-05-29 DIAGNOSIS — R197 Diarrhea, unspecified: Secondary | ICD-10-CM | POA: Diagnosis not present

## 2010-05-29 DIAGNOSIS — F3289 Other specified depressive episodes: Secondary | ICD-10-CM | POA: Diagnosis present

## 2010-05-29 DIAGNOSIS — K35209 Acute appendicitis with generalized peritonitis, without abscess, unspecified as to perforation: Principal | ICD-10-CM | POA: Diagnosis present

## 2010-05-29 DIAGNOSIS — Z950 Presence of cardiac pacemaker: Secondary | ICD-10-CM

## 2010-05-29 DIAGNOSIS — K352 Acute appendicitis with generalized peritonitis, without abscess: Principal | ICD-10-CM | POA: Diagnosis present

## 2010-05-29 DIAGNOSIS — R509 Fever, unspecified: Secondary | ICD-10-CM | POA: Diagnosis not present

## 2010-05-29 DIAGNOSIS — F329 Major depressive disorder, single episode, unspecified: Secondary | ICD-10-CM | POA: Diagnosis present

## 2010-05-29 LAB — DIFFERENTIAL
Basophils Absolute: 0 10*3/uL (ref 0.0–0.1)
Basophils Relative: 0 % (ref 0–1)
Eosinophils Absolute: 0 10*3/uL (ref 0.0–0.7)
Eosinophils Relative: 0 % (ref 0–5)
Monocytes Absolute: 1.2 10*3/uL — ABNORMAL HIGH (ref 0.1–1.0)
Monocytes Relative: 6 % (ref 3–12)
Neutro Abs: 17.2 10*3/uL — ABNORMAL HIGH (ref 1.7–7.7)

## 2010-05-29 LAB — POCT I-STAT, CHEM 8
BUN: 13 mg/dL (ref 6–23)
Chloride: 97 mEq/L (ref 96–112)
HCT: 43 % (ref 36.0–46.0)
Potassium: 3.4 mEq/L — ABNORMAL LOW (ref 3.5–5.1)

## 2010-05-29 LAB — CBC
HCT: 39.8 % (ref 36.0–46.0)
Platelets: 241 10*3/uL (ref 150–400)
RBC: 3.99 MIL/uL (ref 3.87–5.11)
RDW: 12.3 % (ref 11.5–15.5)
WBC: 20.4 10*3/uL — ABNORMAL HIGH (ref 4.0–10.5)

## 2010-05-29 MED ORDER — IOHEXOL 300 MG/ML  SOLN
125.0000 mL | Freq: Once | INTRAMUSCULAR | Status: AC | PRN
  Administered 2010-05-29: 125 mL via INTRAVENOUS

## 2010-05-30 LAB — CBC
HCT: 33.5 % — ABNORMAL LOW (ref 36.0–46.0)
Hemoglobin: 10.6 g/dL — ABNORMAL LOW (ref 12.0–15.0)
MCV: 100.3 fL — ABNORMAL HIGH (ref 78.0–100.0)
Platelets: 205 10*3/uL (ref 150–400)
RBC: 3.34 MIL/uL — ABNORMAL LOW (ref 3.87–5.11)
WBC: 15.5 10*3/uL — ABNORMAL HIGH (ref 4.0–10.5)

## 2010-05-30 LAB — DIFFERENTIAL
Lymphocytes Relative: 10 % — ABNORMAL LOW (ref 12–46)
Lymphs Abs: 1.5 10*3/uL (ref 0.7–4.0)
Monocytes Relative: 5 % (ref 3–12)
Neutro Abs: 13.3 10*3/uL — ABNORMAL HIGH (ref 1.7–7.7)
Neutrophils Relative %: 86 % — ABNORMAL HIGH (ref 43–77)

## 2010-05-31 LAB — CBC
HCT: 35.8 % — ABNORMAL LOW (ref 36.0–46.0)
Hemoglobin: 11.4 g/dL — ABNORMAL LOW (ref 12.0–15.0)
MCH: 31.9 pg (ref 26.0–34.0)
MCHC: 31.8 g/dL (ref 30.0–36.0)
MCV: 100.3 fL — ABNORMAL HIGH (ref 78.0–100.0)

## 2010-06-06 NOTE — Discharge Summary (Signed)
Brandy [name], Brandy [name]              ACCOUNT NO.:  [id number]  MEDICAL RECORD NO.:  [id number]           PATIENT TYPE:  I  LOCATION:  1523                         FACILITY:  [facility]  PHYSICIAN:  Thornton Park. Daphine Deutscher, MD  DATE OF BIRTH:  1955-05-12  DATE OF ADMISSION:  05/29/2010 DATE OF DISCHARGE:  05/31/2010                              DISCHARGE SUMMARY   ADMISSION DIAGNOSIS:  Acute appendicitis.  DISCHARGE DIAGNOSIS:  Acute necrotic appendicitis with microperforation.  PROCEDURES:  Laparoscopic cholecystectomy May 29, 2010.  BRIEF HISTORY:  The patient is a 55 year old female who presented with onset of right lower quadrant abdominal pain yesterday and was worked up in Urgent Care.  Her CT showed appendicitis.  She was referred to the ER at [facility] and admitted by Dr. Daphine Deutscher for treatment.  He placed the patient on antibiotics and took her to the OR for appendectomy.  PAST MEDICAL HISTORY: 1. History of asthma. 2. 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 3. History of depression. 4. History of Hirschsprung's with colon resection aged 7 months.  PAST SURGICAL HISTORY:  As above.  SOCIAL HISTORY:  She is married.  She does not use alcohol, tobacco or drugs.  REVIEW OF SYSTEMS:  Noncontributory.  HOME MEDICATIONS:  Included Zyrtec, Symbicort, fluconazole, Prozac, ProAir and Toprol.  ALLERGIES:  NONE.  For further history and physicals please see the dictated note.  HOSPITAL COURSE:  The patient was taken to the OR and underwent laparoscopic appendectomy.  As noted above, it was necrotic with some microperforation.  The patient tolerated the procedure well and returned to the floor in satisfactory condition.  She ran some temperature postoperatively and was maintained on antibiotics.  First day she had no flatus or BM.  She had some bowel sounds.  She was having a great deal of discomfort and nausea.  We slowly advanced  her.  By the second postoperative day she was taking full liquids well.  Her white count had come down from 20,400 on admission on February 26 down to 6500. Hemoglobin and hematocrit were stable.  She has had some diarrhea since the first postoperative night.  This has improved.  She has also had some hot flashes which she had prior to admission.  After lunch she was ambulating well.  She has had a little bit of nausea last night and is concerned about that, but overall feels like she is ready to go home. We will plan to discharge her home today.  DISCHARGE MEDICATIONS:  She will go home on: 1. Benzoate 200 mg p.o. t.i.d. 2. Calcium 500. 3. Vitamin D 200 units daily. 4. Flonase one spray daily. 5. ProAir. 6. Albuterol p.r.n. 7. Prozac 10 mg daily. 8. Symbicort one puff b.i.d. 9. Toprol 25 mg daily. 10.Zyrtec 10 mg daily.  New medications will include: 1. Augmentin 875/125 one p.o. b.i.d. with meals. 2. Oxycodone APAP 5/325 one to two tablets q.4 hours p.r.n. 3. Phenergan 25 mg one-half tablet to one by mouth q.6 p.r.n. for     nausea.  FOLLOWUP:  She will return for followup in  2 weeks at the [facility] on June 14, 2010, at 2:45.  DISCHARGE INSTRUCTIONS:  She is instructed to clean her wounds with plain soap and water.  She may shower.  No bathing or hot tubs.  She is to resume her activity slowly as tolerated.  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  CONDITION ON DISCHARGE:  Improved.     Eber Hong, P.A.   ______________________________ Thornton Park Daphine Deutscher, MD    WDJ/MEDQ  D:  05/31/2010  T:  05/31/2010  Job:  [id number]  cc:   Aida Puffer Fax: [phone]  Thornton Park. Daphine Deutscher, MD [address], [address] [place] [address]  Ritta Slot, MD Fax: [phone]  Electronically Signed by Sherrie George P.A. on 06/02/2010 66:44:03 PM Electronically Signed by Luretha Murphy MD on  06/06/2010 01:35:08 PM

## 2010-06-06 NOTE — Op Note (Signed)
  NAMECRYSTALYN, Brandy [name]              ACCOUNT NO.:  [id number]  MEDICAL RECORD NO.:  [id number]           PATIENT TYPE:  I  LOCATION:  1523                         FACILITY:  [facility]  PHYSICIAN:  Thornton Park. Daphine Deutscher, MD  DATE OF BIRTH:  11/22/1955  DATE OF PROCEDURE: DATE OF DISCHARGE:                              OPERATIVE REPORT   PREOPERATIVE DIAGNOSIS:  Acute appendicitis.  POSTOPERATIVE DIAGNOSIS:  Acute necrotic appendicitis with microperforation.  PROCEDURE:  Laparoscopic appendectomy.  SURGEON:  Thornton Park. Daphine Deutscher, MD  ASSISTANT:  None.  ANESTHESIA:  General.  DESCRIPTION OF PROCEDURE:  [name] is a 55 year old white female who came in having been referred for a CT scan by the folks at [facility].  She had a 2-day history of abdominal pain.  CT scan was obtained, which showed acute appendicitis.  She was seen by me in the ED and informed consent was obtained.  She was taken to OR #1 on Sunday afternoon, May 29, 2010 and after her Unasyn, the abdomen was prepped with PCMX and draped sterilely.  After time-out was performed, the abdomen was entered going to the umbilicus, made a longitudinal incision and inserting Hassan cannula.  The abdomen was insufflated. She had a previous left lower quadrant incision from a colon resection for Hirschsprung's as a 53-month-old and I was able to work around these thin filmy adhesions.  A 10/11 was placed in the left lower quadrant and a 5 in the right upper quadrant.  The cecum was then mobilized and the appendix root was seen and was black, gray, and there was pus around it and it was nasty.  It was stuck down her tube and ovary.  I had to kind of dig it out where it was stuck down into the pelvis, but I was able to elevate it and then began dividing the mesentery.  I did this with a harmonic scalpel and ultimately isolated the base.  I put the stapler in, fired across the base, cut a complete transection of the base  and viable tissue.  I then completed the removal of the appendix, placing in a bag, and bringing it out to the umbilicus.  I irrigated carefully and no bleeding was seen.  Everything looked good.  I withdrew some fluid from the pelvis as well.  I injected all the port sites.  I closed the umbilical defect with a figure-of-8 suture of 0 Vicryl under laparoscopic vision.  Everything looked good and I then let the abdomen deflate.  Wounds were closed with 4-0 Vicryl with Dermabond.  The patient was taken to the recovery room in satisfactory condition.     Thornton Park Daphine Deutscher, MD     MBM/MEDQ  D:  05/29/2010  T:  05/29/2010  Job:  [id number]  cc:   Aida Puffer Fax: [phone]  Electronically Signed by Luretha Murphy MD on 06/06/2010 01:35:04 PM

## 2010-06-27 ENCOUNTER — Ambulatory Visit: Payer: Self-pay | Admitting: Internal Medicine

## 2010-08-16 NOTE — Assessment & Plan Note (Signed)
[facility]                             PULMONARY OFFICE NOTE   NAME:[name], Brandy CARROZZA                     MRN:          [id number]  DATE:10/30/2006                            DOB:          12/26/1955    PROBLEM:  1. Asthmatic bronchitis.  2. Allergic rhinitis.  3. Vocal cord paresis/splint.  4. Esophageal reflux.   HISTORY:  One-year followup.  Difficult spring because of allergy flare  and she had persistent hoarseness then.  She has been followed by ENT at  [facility] and had recent exam.  He apparently thought any reflux was  controlled.  They talked about the possibility that Advair might cause  hoarseness and I discussed dry powder, versus metered inhaler dispensers  with her.  She has continued allergy vaccine at 1:10 with no problems.  Skin testing was 1996 and I told her we needed to reassess.   MEDICATIONS:  1. Zyrtec D.  2. Prozac.  3. Advair 100/50.  4. Flonase.  5. Birth control pills.  6. Nexium 40 mg.  7. Allergy vaccine.  8. Albuterol inhaler.  9. She has an EpiPen.   DRUG INTOLERANCE:  ERYTHROMYCIN.   OBJECTIVE:  Weight 139 pounds, BP 116/72, pulse 86, room air saturation  99%.  Mild hoarseness, which is chronic for her, without stridor.  Quiet, clear chest.  Heart sounds regular, without murmur or gallop.  No adenopathy.   IMPRESSION:  1. Allergic rhinitis.  2. Vocal cord paresis.  3. Controlled asthma/bronchitis.  4. Reflux symptoms currently are controlled.   PLAN:  1. Try changing Advair to Symbicort 160/4.5 two puffs b.i.d.  2. Change fluticasone nasal spray to give her Omnaris one spray each      nostril daily.  3. Schedule return for allergy testing as able.     Clinton D. Maple Hudson, MD, Tonny Bollman, FACP  Electronically Signed    CDY/MedQ  DD: 10/30/2006  DT: 10/31/2006  Job #: [id number]   cc:   Aida Puffer

## 2010-08-16 NOTE — Discharge Summary (Signed)
NAMELEYLANY, Brandy [name]              ACCOUNT NO.:  [id number]   MEDICAL RECORD NO.:  [id number]          PATIENT TYPE:  INP   LOCATION:  2503                         FACILITY:  [facility]   PHYSICIAN:  Ritta Slot, MD     DATE OF BIRTH:  07/01/1955   DATE OF ADMISSION:  08/05/2008  DATE OF DISCHARGE:  08/06/2008                               DISCHARGE SUMMARY   DISCHARGE DIAGNOSES:  1. Intermittent complete heart block, status post pacemaker implant      this admission.  2. Low-risk Myoview on July 30, 2008.  3. Preserved left ventricular function.  4. History of asthma.  5. History of depression.   HOSPITAL COURSE:  The patient is a pleasant 55 year old female who has  been diagnosed with third-degree AV block.  She was asymptomatic.  She  has no prior history of coronary artery disease or syncope.  She did  admit on review of systems to have some fatigue and lower risk levels of  energy.  She denies having syncope.  Dr. Lynnea Ferrier set her up for [facility]  and echocardiogram and CardioNet monitor.  CardioNet showed intermittent  third-degree AV block idioventricular escape rhythms.  Review of her  CardioNet by Dr. Lynnea Ferrier was done as an outpatient and he felt she  should have an elective pacemaker implant.  This was done on Aug 05, 2008  with a Medtronic device.  She tolerated the procedure well.  Labs preop  showed a white count 7.9, hemoglobin 13.2, hematocrit 39.3, platelets  196, INR 1.0, BUN 11, creatinine 1.0.  Sodium 141, potassium 4.9, BUN  11, creatinine 1.0.  LFTs were normal.  INR is 1.0.  Chest x-ray shows  emphysematous changes but no pneumothorax postop.   DISCHARGE MEDICATIONS:  Fluoxetine 10 mg a day, Symbicort inhaler twice  daily, Zyrtec daily nasal spray p.r.n.   DISPOSITION:  The patient discharged in stable condition.  Will follow  up with Dr. Lynnea Ferrier for wound check in about a week.      Abelino Derrick, P.A.      Ritta Slot, MD  Electronically  Signed    LKK/MEDQ  D:  08/06/2008  T:  08/07/2008  Job:  [id number]   cc:   Aida Puffer

## 2010-08-16 NOTE — Assessment & Plan Note (Signed)
[facility]                             PULMONARY OFFICE NOTE   NAME:[name], Brandy VORIS                     MRN:          [id number]  DATE:02/06/2007                            DOB:          03-01-1956    PROBLEMS:  1. Asthmatic bronchitis.  2. Allergic rhinitis.  3. Vocal cord paresis/splint.  4. Esophageal reflux.   HISTORY:  She returned as planned for allergy skin testing having been  on allergy vaccine for many years. She has noted some generalized  pruritus without rash since she has been off of Zyrtec the last three  days in anticipation of her test. Otherwise, there is no recent rhinitis  or bronchitis symptoms. Medication list was reviewed as charted.   OBJECTIVE:  Weight 144 pounds, blood pressure 118/74, pulse 82, room air  saturation 100%. No rash or excoriation. No adenopathy. Her chronic  hoarseness is noted without strider. Nasopharynx and chest are clear.  Heart sounds are regular without murmur.   SKIN TEST:  Positive puncture and intradermal testing particularly for  ragweed, some common tree pollens, dust and dust mite.   IMPRESSION:  1. Allergic rhinitis.  2. History of asthma.  3. Vocal cord paresis.   PLAN:  We discussed options. She has been on allergy vaccine a long time  from different providers. This looks like a good time for her to stop  and observe. We can restart with vaccine based on the new testing if  needed. If Zyrtec is sufficient, she will stick with that. We have  reviewed environmental precautions. Schedule return 4 months, earlier  p.r.n.     Clinton D. Maple Hudson, MD, Tonny Bollman, FACP  Electronically Signed    CDY/MedQ  DD: 02/09/2007  DT: 02/10/2007  Job #: [id number]   cc:   Aida Puffer

## 2010-08-19 NOTE — Assessment & Plan Note (Signed)
[facility]ALTHCARE                               PULMONARY OFFICE NOTE   NAME:Brandy [name], Brandy [name]                     MRN:          [id number]  DATE:10/06/2005                            DOB:          08/16/1955    PROBLEM:  1.  Asthmatic bronchitis.  2.  Allergic rhinitis.  3.  Vocal cord paresis/splint.  4.  Esophageal reflux.   HISTORY:  I had last seen her a year ago and she comes for scheduled  followup now, but in the meantime she did see the nurse practitioner in  March with an acute bronchitis and sinusitis, treated then with Avelox and  prednisone.  Since then, she has been clear and feels that she is doing  well.  She is having little or no choking, strangling or aspiration  symptoms.  Voice quality is stable enough that she is able to sing, which is  an active concern for her.  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  She continues to give her own allergy  vaccine at 1:10.  She does have an EpiPen and knows how to use it, but has  never had a significant problem.  I took the opportunity again to go over  risk/benefit considerations, anaphylaxis and management, alternative  therapies, and concern in policy related to administration of vaccine out of  a medical office.  We reviewed and she signed our waiver, making the  decision that she wished to continue giving her own vaccine at home at this  point.  She currently has no primary physician, and was encouraged to  establish with one.   MEDICATIONS:  1.  Zyrtec D.  2.  Prozac.  3.  Advair 100/50.  4.  Flonase.  5.  Birth control pills.  6.  Nexium 40 mg.  7.  Fosamax D.  8.  Rescue albuterol inhaler rarely needed.   ALLERGIES:  Drug intolerant to ERYTHROMYCIN.   OBJECTIVE:  VITAL SIGNS:  Weight 140 pounds, blood pressure 142/82, pulse  regular at 70, room air saturation 100%.  GENERAL:  Voice quality sounds quite normal during relaxed speech  with no  stridor, no pharyngeal erythema, no postnasal drainage.  LUNGS:  Clear.  Work of breathing was not increased.  Heart sounds were  regular without murmur or gallop.   IMPRESSION:  Good control of asthma and allergic rhinitis at this point.  Her history of vocal cord paresis was reviewed as above, but she seems to be  doing really well.   PLAN:  1.  Continue vaccine at 1:10 after discussion and decisions as above.  2.  Consider allergy retest next spring per her questions.  3.  Schedule return in 1 year, earlier p.r.n.                                   Clinton D. Maple Hudson, MD, FCCP, FACP   CDY/MedQ  DD:  10/15/2005  DT:  10/15/2005  Job #:  [id number]  cc:   Lucky Cowboy, MD

## 2010-10-07 ENCOUNTER — Other Ambulatory Visit: Payer: Self-pay | Admitting: *Deleted

## 2010-10-07 MED ORDER — BUDESONIDE-FORMOTEROL FUMARATE 160-4.5 MCG/ACT IN AERO: 2.0000 | INHALATION_SPRAY | Freq: Two times a day (BID) | RESPIRATORY_TRACT | Status: DC

## 2010-11-23 ENCOUNTER — Encounter: Payer: Self-pay | Admitting: Internal Medicine

## 2010-11-24 ENCOUNTER — Ambulatory Visit (INDEPENDENT_AMBULATORY_CARE_PROVIDER_SITE_OTHER): Payer: BC Managed Care – PPO | Admitting: Internal Medicine

## 2010-11-24 ENCOUNTER — Encounter: Payer: Self-pay | Admitting: Internal Medicine

## 2010-11-24 VITALS — BP 116/66 | HR 64 | Ht 67.0 in | Wt 138.6 lb

## 2010-11-24 DIAGNOSIS — J209 Acute bronchitis, unspecified: Secondary | ICD-10-CM

## 2010-11-24 DIAGNOSIS — J3800 Paralysis of vocal cords and larynx, unspecified: Secondary | ICD-10-CM

## 2010-11-24 DIAGNOSIS — J309 Allergic rhinitis, unspecified: Secondary | ICD-10-CM

## 2010-11-24 MED ORDER — FLUTICASONE PROPIONATE 50 MCG/ACT NA SUSP: 2.0000 | Freq: Every day | NASAL | Status: DC

## 2010-11-24 MED ORDER — BUDESONIDE-FORMOTEROL FUMARATE 160-4.5 MCG/ACT IN AERO: 2.0000 | INHALATION_SPRAY | Freq: Two times a day (BID) | RESPIRATORY_TRACT | Status: DC

## 2010-11-24 MED ORDER — ALBUTEROL SULFATE HFA 108 (90 BASE) MCG/ACT IN AERS: 2.0000 | INHALATION_SPRAY | RESPIRATORY_TRACT | Status: DC | PRN

## 2010-11-24 NOTE — Patient Instructions (Signed)
Meds refilled  Please call as needed 

## 2010-11-24 NOTE — Progress Notes (Signed)
Subjective:    Patient ID: Brandy [name], female    DOB: June 04, 1955, 55 y.o.   MRN: [id number]  HPI 11/24/10-female never smoker followed for allergic rhinitis, asthmatic bronchitis, complicated by GERD, vocal cord paresis/ splint, complete heart block/atrial fibrillation/pacemaker Last here May 19, 2010. Has had an appendectomy since last year. 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 New HVAC system being installed, so construction issues now, but she expects this to be eventually an improvement at her school building..  She had done well but is now starting to notice some head congestion and cough. She continues Symbicort, rescue HFA, and she likes tesalon for cough.. She feels these meds, with generic zyrtec are sufficient.   Review of Systems Constitutional:   No-   weight loss, night sweats, fevers, chills, fatigue, lassitude. HEENT:   No-  headaches, difficulty swallowing, tooth/dental problems, sore throat,       No-  sneezing, itching, ear ache,+ mild-nasal congestion, post nasal drip,  CV:  No-   chest pain, orthopnea, PND, swelling in lower extremities, anasarca, dizziness, palpitations Resp: No-   shortness of breath with exertion or at rest.              No-   productive cough,  +mild non-productive cough,  No-  coughing up of blood.              No-   change in color of mucus.  No- wheezing.   Skin: No-   rash or lesions. GI:  No-   heartburn, indigestion, abdominal pain, nausea, vomiting, diarrhea,                 change in bowel habits, loss of appetite GU: No-   dysuria, change in color of urine, no urgency or frequency.  No- flank pain. MS:  No-   joint pain or swelling.  No- decreased range of motion.  No- back pain. Neuro- grossly normal to observation, Or:  Psych:  No- change in mood or affect. No depression or anxiety.  No memory loss.      Objective:   Physical Exam General- Alert, Oriented, Affect-appropriate, Distress- none acute Skin- rash-none, lesions- none,  excoriation- none Lymphadenopathy- none Head- atraumatic            Eyes- Gross vision intact, PERRLA, conjunctivae clear secretions            Ears- Hearing, canals normal            Nose- Clear, No Septal dev, mucus, polyps, erosion, perforation             Throat- Mallampati II , mucosa -glandular, a little red , drainage- none, tonsils- atrophic;          +slight hoarseness Neck- flexible , trachea midline, no stridor , thyroid nl, carotid no bruit Chest - symmetrical excursion , unlabored           Heart/CV- RRR , no murmur , no gallop  , no rub, nl s1 s2                           - JVD- none , edema- none, stasis changes- none, varices- none           Lung- clear to P&A, wheeze- none, cough- none , dullness-none, rub- none           Chest wall-  Abd- tender-no, distended-no, bowel sounds-present, HSM- no Br/ Gen/ Rectal- Not done, not  indicated Extrem- cyanosis- none, clubbing, none, atrophy- none, strength- nl Neuro- grossly intact to observation         Assessment & Plan:

## 2010-11-24 NOTE — Assessment & Plan Note (Addendum)
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

## 2010-11-24 NOTE — Assessment & Plan Note (Signed)
Ok- Education officer, environmental for GERD  meds refilled

## 2010-12-07 ENCOUNTER — Other Ambulatory Visit: Payer: Self-pay | Admitting: *Deleted

## 2010-12-07 MED ORDER — BENZONATATE 200 MG PO CAPS: 200.0000 mg | ORAL_CAPSULE | Freq: Three times a day (TID) | ORAL | Status: DC | PRN

## 2010-12-29 LAB — CBC
MCHC: 33.7
MCV: 97.6
RBC: 3.86 — ABNORMAL LOW
RDW: 14.3

## 2010-12-29 LAB — DIFFERENTIAL
Basophils Absolute: 0
Eosinophils Relative: 2
Lymphocytes Relative: 36
Monocytes Absolute: 0.5

## 2010-12-29 LAB — T-HELPER CELL (CD4) - (RCID CLINIC ONLY): CD4 T Cell Abs: 1300

## 2011-04-04 DIAGNOSIS — A4902 Methicillin resistant Staphylococcus aureus infection, unspecified site: Secondary | ICD-10-CM

## 2011-04-04 HISTORY — DX: Methicillin resistant Staphylococcus aureus infection, unspecified site: A49.02

## 2011-05-08 ENCOUNTER — Ambulatory Visit: Payer: BC Managed Care – PPO | Admitting: Internal Medicine

## 2011-05-22 ENCOUNTER — Ambulatory Visit: Payer: BC Managed Care – PPO | Admitting: Internal Medicine

## 2011-06-12 ENCOUNTER — Ambulatory Visit (INDEPENDENT_AMBULATORY_CARE_PROVIDER_SITE_OTHER): Payer: BC Managed Care – PPO | Admitting: Internal Medicine

## 2011-06-12 ENCOUNTER — Encounter: Payer: Self-pay | Admitting: Internal Medicine

## 2011-06-12 VITALS — BP 120/74 | HR 83 | Ht 67.0 in | Wt 139.4 lb

## 2011-06-12 DIAGNOSIS — F410 Panic disorder [episodic paroxysmal anxiety] without agoraphobia: Secondary | ICD-10-CM

## 2011-06-12 DIAGNOSIS — J3800 Paralysis of vocal cords and larynx, unspecified: Secondary | ICD-10-CM

## 2011-06-12 DIAGNOSIS — J309 Allergic rhinitis, unspecified: Secondary | ICD-10-CM

## 2011-06-12 MED ORDER — LORAZEPAM 0.5 MG PO TABS: ORAL_TABLET | ORAL | Status: DC

## 2011-06-12 NOTE — Patient Instructions (Signed)
Script for lorazepam for anxiety/ panic attack if needed  Please call as needed

## 2011-06-12 NOTE — Progress Notes (Signed)
Patient ID: Brandy [name], female    DOB: 1955-08-30, 56 y.o.   MRN: [id number]  HPI 11/24/10-female never smoker followed for allergic rhinitis, asthmatic bronchitis, complicated by GERD, vocal cord paresis/ splint, complete heart block/atrial fibrillation/pacemaker Last here May 19, 2010. Has had an appendectomy since last year. 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 New HVAC system being installed, so construction issues now, but she expects this to be eventually an improvement at her school building..  She had done well but is now starting to notice some head congestion and cough. She continues Symbicort, rescue HFA, and she likes tesalon for cough.. She feels these meds, with generic zyrtec are sufficient.   06/12/11- female never smoker followed for allergic rhinitis, asthmatic bronchitis, complicated by GERD, vocal cord paresis/ splint, complete heart block/atrial fibrillation/pacemaker   PCP Dr Aida Puffer Winter with little laryngitis and no bad colds. Doing well now. Occasional panic attacks related to stress at work. I had diagnosed depression years ago and she has taken Prozac. She asks to try lorazepam.  Review of Systems-see HPI Constitutional:   No-   weight loss, night sweats, fevers, chills, fatigue, lassitude. HEENT:   No-  headaches, difficulty swallowing, tooth/dental problems, sore throat,       No-  sneezing, itching, ear ache,+ mild-nasal congestion, post nasal drip,  CV:  No-   chest pain, orthopnea, PND, swelling in lower extremities, anasarca, dizziness, palpitations Resp: No-   shortness of breath with exertion or at rest.              No-   productive cough,  +mild non-productive cough,  No-  coughing up of blood.              No-   change in color of mucus.  No- wheezing.   Skin: No-   rash or lesions. GI:  No-   heartburn, indigestion, abdominal pain, nausea, vomiting,  GU:  MS:  No-   joint pain or swelling.  No- decreased range of motion.  No- back  pain. Neuro- grossly normal to observation, Or:  Psych:  No- change in mood or affect. No depression or anxiety.  No memory loss.      Objective:   Physical Exam General- Alert, Oriented, Affect-appropriate, Distress- none acute Skin- rash-none, lesions- none, excoriation- none Lymphadenopathy- none Head- atraumatic            Eyes- Gross vision intact, PERRLA, conjunctivae clear secretions            Ears- Hearing, canals normal            Nose- Clear, No Septal dev, mucus, polyps, erosion, perforation             Throat- Mallampati II , mucosa -glandular, a little red , drainage- none, tonsils- atrophic;          +slight hoarseness Neck- flexible , trachea midline, no stridor , thyroid nl, carotid no bruit Chest - symmetrical excursion , unlabored           Heart/CV- RRR , no murmur , no gallop  , no rub, nl s1 s2                           - JVD- none , edema- none, stasis changes- none, varices- none           Lung- clear to P&A, wheeze- none, cough- none , dullness-none, rub- none  Chest wall-  Abd-  Br/ Gen/ Rectal- Not done, not indicated Extrem- cyanosis- none, clubbing, none, atrophy- none, strength- nl Neuro- grossly intact to observation

## 2011-06-15 ENCOUNTER — Encounter: Payer: Self-pay | Admitting: Internal Medicine

## 2011-06-15 NOTE — Assessment & Plan Note (Signed)
Review of past medical vocal cord paresis. She is doing well and denies choking with food or drink.

## 2011-06-15 NOTE — Assessment & Plan Note (Signed)
No problems yet with early spring pollen. We discussed symptom management with OTC meds.

## 2011-06-15 NOTE — Assessment & Plan Note (Signed)
Situational anxiety related to work stress. After appropriate discussion, I agreed to give a prescription for lorazepam but I would like her to address this with her primary physician long term.

## 2011-07-01 ENCOUNTER — Other Ambulatory Visit: Payer: Self-pay | Admitting: Internal Medicine

## 2011-08-03 ENCOUNTER — Other Ambulatory Visit: Payer: Self-pay | Admitting: Internal Medicine

## 2011-11-20 ENCOUNTER — Telehealth: Payer: Self-pay | Admitting: Internal Medicine

## 2011-11-20 MED ORDER — AMOXICILLIN-POT CLAVULANATE 875-125 MG PO TABS: 1.0000 | ORAL_TABLET | Freq: Two times a day (BID) | ORAL | Status: AC

## 2011-11-20 NOTE — Telephone Encounter (Signed)
Per CY augmentin 875mg  #20 take 1 bid  No refill Pt aware and rx sent

## 2011-11-20 NOTE — Telephone Encounter (Signed)
Spoke with pt. She states that she has had horrible HA x 3 days, facial pressure esp around her eyes, and also PND. She has tried OTC decongestants w/o relief and would like something called in. Please advise, thanks!!

## 2011-11-20 NOTE — Telephone Encounter (Signed)
Pt returned call. Brandy [name] °

## 2011-11-20 NOTE — Telephone Encounter (Signed)
Returning call.

## 2011-11-20 NOTE — Telephone Encounter (Signed)
LMTCB

## 2011-11-20 NOTE — Telephone Encounter (Signed)
lmomtcb x1 for pt to get further symptoms. How long this has been going on, has she taken anything

## 2011-12-14 ENCOUNTER — Ambulatory Visit: Payer: BC Managed Care – PPO | Admitting: Internal Medicine

## 2011-12-21 ENCOUNTER — Ambulatory Visit (INDEPENDENT_AMBULATORY_CARE_PROVIDER_SITE_OTHER): Payer: BC Managed Care – PPO | Admitting: Internal Medicine

## 2011-12-21 ENCOUNTER — Encounter: Payer: Self-pay | Admitting: Internal Medicine

## 2011-12-21 VITALS — BP 122/66 | HR 88 | Ht 67.0 in | Wt 137.2 lb

## 2011-12-21 DIAGNOSIS — J018 Other acute sinusitis: Secondary | ICD-10-CM

## 2011-12-21 DIAGNOSIS — J209 Acute bronchitis, unspecified: Secondary | ICD-10-CM

## 2011-12-21 DIAGNOSIS — J3800 Paralysis of vocal cords and larynx, unspecified: Secondary | ICD-10-CM

## 2011-12-21 MED ORDER — BUDESONIDE-FORMOTEROL FUMARATE 160-4.5 MCG/ACT IN AERO: 2.0000 | INHALATION_SPRAY | Freq: Two times a day (BID) | RESPIRATORY_TRACT | Status: DC

## 2011-12-21 MED ORDER — ALBUTEROL SULFATE HFA 108 (90 BASE) MCG/ACT IN AERS: 2.0000 | INHALATION_SPRAY | Freq: Four times a day (QID) | RESPIRATORY_TRACT | Status: DC | PRN

## 2011-12-21 NOTE — Progress Notes (Signed)
Patient ID: Brandy [name], female    DOB: 02-07-1956, 56 y.o.   MRN: [id number]  HPI 11/24/10-female never smoker followed for allergic rhinitis, asthmatic bronchitis, complicated by GERD, vocal cord paresis/ splint, complete heart block/atrial fibrillation/pacemaker Last here May 19, 2010. Has had an appendectomy since last year. 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 New HVAC system being installed, so construction issues now, but she expects this to be eventually an improvement at her school building..  She had done well but is now starting to notice some head congestion and cough. She continues Symbicort, rescue HFA, and she likes tesalon for cough.. She feels these meds, with generic zyrtec are sufficient.   06/12/11- female never smoker followed for allergic rhinitis, asthmatic bronchitis, complicated by GERD, vocal cord paresis/ splint, complete heart block/atrial fibrillation/pacemaker   PCP Dr Aida Puffer Winter with little laryngitis and no bad colds. Doing well now. Occasional panic attacks related to stress at work. I had diagnosed depression years ago and she has taken Prozac. She asks to try lorazepam.  12/21/11- 31  female never smoker followed for allergic rhinitis, asthmatic bronchitis, complicated by GERD,  vocal cord paresis/ splint, complete heart block/atrial fibrillation/pacemaker   PCP Dr Aida Puffer Doing okay at this time; had flare ups with weather changes. Gets flu vaccine at school. Carpet removed from classroom, floors now until she thinks that helps a lot.  Review of Systems-see HPI Constitutional:   No-   weight loss, night sweats, fevers, chills, fatigue, lassitude. HEENT:   No-  headaches, difficulty swallowing, tooth/dental problems, sore throat,       No-  sneezing, itching, ear ache,+ mild-nasal congestion, post nasal drip,  CV:  No-   chest pain, orthopnea, PND, swelling in lower extremities, anasarca, dizziness, palpitations Resp: No-   shortness of  breath with exertion or at rest.              No-   productive cough,  +mild non-productive cough,  No-  coughing up of blood.              No-   change in color of mucus.  No- wheezing.   Skin: No-   rash or lesions. GI:  No-   heartburn, indigestion, abdominal pain, nausea, vomiting,  GU:  MS:  No-   joint pain or swelling.   Neuro- nothing unusual  Psych:  No- change in mood or affect. No depression or anxiety.  No memory loss.    Objective:   Physical Exam General- Alert, Oriented, Affect-appropriate, Distress- none acute Skin- rash-none, lesions- none, excoriation- none Lymphadenopathy- none Head- atraumatic            Eyes- Gross vision intact, PERRLA, conjunctivae clear secretions            Ears- Hearing, canals normal            Nose- Clear, No Septal dev, mucus, polyps, erosion, perforation             Throat- Mallampati II , mucosa -glandular, a little red , drainage- none, tonsils- atrophic;  voice quality good today Neck- flexible , trachea midline, no stridor , thyroid nl, carotid no bruit Chest - symmetrical excursion , unlabored           Heart/CV- RRR , no murmur , no gallop  , no rub, nl s1 s2                           -  JVD- none , edema- none, stasis changes- none, varices- none           Lung- clear to P&A, wheeze- none, cough- none , dullness-none, rub- none           Chest wall- +L pacemaker Abd-  Br/ Gen/ Rectal- Not done, not indicated Extrem- cyanosis- none, clubbing, none, atrophy- none, strength- nl Neuro- grossly intact to observation

## 2011-12-21 NOTE — Patient Instructions (Addendum)
Refill scripts for Symbicort and Proair sent   Please call as needed

## 2011-12-30 NOTE — Assessment & Plan Note (Signed)
Good control with maintenance Symbicort using a spacer.

## 2011-12-30 NOTE — Assessment & Plan Note (Signed)
Was quality sounds good today.

## 2011-12-30 NOTE — Assessment & Plan Note (Signed)
One sinus infection in July or August but otherwise has done well.

## 2012-01-29 ENCOUNTER — Ambulatory Visit (INDEPENDENT_AMBULATORY_CARE_PROVIDER_SITE_OTHER): Payer: BC Managed Care – PPO | Admitting: Family Medicine

## 2012-01-29 VITALS — BP 140/80 | HR 87 | Temp 98.3°F | Resp 18 | Wt 140.0 lb

## 2012-01-29 DIAGNOSIS — L03317 Cellulitis of buttock: Secondary | ICD-10-CM

## 2012-01-29 MED ORDER — KETOROLAC TROMETHAMINE 30 MG/ML IJ SOLN
30.0000 mg | Freq: Once | INTRAMUSCULAR | Status: AC
  Administered 2012-01-29: 30 mg via INTRAMUSCULAR

## 2012-01-29 MED ORDER — KETOROLAC TROMETHAMINE 30 MG/ML IJ SOLN: 30.0000 mg | Freq: Once | INTRAMUSCULAR | Status: DC

## 2012-01-29 MED ORDER — TRAMADOL HCL 50 MG PO TABS: 50.0000 mg | ORAL_TABLET | Freq: Four times a day (QID) | ORAL | Status: DC | PRN

## 2012-01-29 MED ORDER — SULFAMETHOXAZOLE-TRIMETHOPRIM 800-160 MG PO TABS: 2.0000 | ORAL_TABLET | Freq: Two times a day (BID) | ORAL | Status: DC

## 2012-01-29 NOTE — Addendum Note (Signed)
Addended by: Lucia Gaskins on: 01/29/2012 05:03 PM   Modules accepted: Orders

## 2012-01-29 NOTE — Progress Notes (Addendum)
  Subjective:    Patient ID: Brandy [name], female    DOB: 05/28/1955, 56 y.o.   MRN: [id number]  HPI  First developed pimple 4d prev and gradually worsened, never drained, then 2d ago really flaired and spread everywhere - can't even sit now.  Husband had a staph infection for which he was hosp for 5 day in August and then had a MRSA sev mos prev which needed recurrent packing.   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 Asthmatic bronchitis   . Allergic rhinitis   . Vaginitis   . Vocal cord paresis   . GERD (gastroesophageal reflux disease)    Review of Systems  Constitutional: Positive for activity change. Negative for fever, chills and diaphoresis.  Musculoskeletal: Positive for myalgias. Negative for joint swelling and gait problem.  Skin: Positive for color change and rash. Negative for pallor and wound.  Hematological: Negative for adenopathy. Does not bruise/bleed easily.  Psychiatric/Behavioral: Positive for disturbed wake/sleep cycle. The patient is nervous/anxious.       BP 140/80  Pulse 87  Temp 98.3 F (36.8 C) (Oral)  Resp 18  Wt 140 lb (63.504 kg) Objective:   Physical Exam  Constitutional: She is oriented to person, place, and time. She appears well-developed and well-nourished. No distress.  HENT:  Head: Normocephalic and atraumatic.  Pulmonary/Chest: Effort normal.  Musculoskeletal: She exhibits tenderness. She exhibits no edema.  Lymphadenopathy:    She has no cervical adenopathy.  Neurological: She is alert and oriented to person, place, and time.  Skin: Skin is warm and dry. No laceration noted. She is not diaphoretic. There is erythema.       L buttock with well defined erythema extending approx 8 in dm with central area of thick induration 3 in x 4 in w/o fluctuance.  Psychiatric: She has a normal mood and affect. Her behavior is normal.          Assessment & Plan:  1.  Abscess -Bactrim DS 2 tabs bid x 14 d and prn tramadol RTC 2d for recheck to see if infection has collected enough for drainage. toradol 30mg  IM x 1 now.

## 2012-01-31 ENCOUNTER — Ambulatory Visit (INDEPENDENT_AMBULATORY_CARE_PROVIDER_SITE_OTHER): Payer: BC Managed Care – PPO | Admitting: Family Medicine

## 2012-01-31 VITALS — BP 105/65 | HR 90 | Temp 98.2°F | Resp 16 | Ht 67.0 in | Wt 138.4 lb

## 2012-01-31 DIAGNOSIS — L03317 Cellulitis of buttock: Secondary | ICD-10-CM

## 2012-01-31 DIAGNOSIS — R52 Pain, unspecified: Secondary | ICD-10-CM

## 2012-01-31 DIAGNOSIS — L0231 Cutaneous abscess of buttock: Secondary | ICD-10-CM

## 2012-01-31 LAB — POCT CBC
Granulocyte percent: 80.4 %G — AB (ref 37–80)
HCT, POC: 39.5 % (ref 37.7–47.9)
Hemoglobin: 11.6 g/dL — AB (ref 12.2–16.2)
Lymph, poc: 2.2 (ref 0.6–3.4)
MCH, POC: 29.1 pg (ref 27–31.2)
MCHC: 29.4 g/dL — AB (ref 31.8–35.4)
MCV: 99.2 fL — AB (ref 80–97)
MID (cbc): 0.5 (ref 0–0.9)
MPV: 9.7 fL (ref 0–99.8)
POC Granulocyte: 11.1 — AB (ref 2–6.9)
POC LYMPH PERCENT: 15.8 %L (ref 10–50)
POC MID %: 3.8 %M (ref 0–12)
Platelet Count, POC: 299 10*3/uL (ref 142–424)
RBC: 3.98 M/uL — AB (ref 4.04–5.48)
RDW, POC: 14.2 %
WBC: 13.8 10*3/uL — AB (ref 4.6–10.2)

## 2012-01-31 MED ORDER — CEFTRIAXONE SODIUM 1 G IJ SOLR
1.0000 g | INTRAMUSCULAR | Status: DC
  Administered 2012-01-31: 1 g via INTRAMUSCULAR

## 2012-01-31 NOTE — Progress Notes (Signed)
Verbal consent obtained.  Local anesthesia with 6 cc 2% lidocaine.  Incision with 11 blade.  Copious purulence expressed.  Wound irrigated with remaining anesthetic.  Packed with 1/4 inch plain packing.  Cleansed and dressed.  Patient tolerated procedure well.

## 2012-02-02 ENCOUNTER — Ambulatory Visit (INDEPENDENT_AMBULATORY_CARE_PROVIDER_SITE_OTHER): Payer: BC Managed Care – PPO | Admitting: Physician Assistant

## 2012-02-02 ENCOUNTER — Encounter: Payer: Self-pay | Admitting: Physician Assistant

## 2012-02-02 VITALS — BP 108/80 | HR 90 | Temp 98.1°F | Resp 16 | Ht 67.0 in | Wt 137.0 lb

## 2012-02-02 DIAGNOSIS — L03317 Cellulitis of buttock: Secondary | ICD-10-CM

## 2012-02-02 DIAGNOSIS — L0291 Cutaneous abscess, unspecified: Secondary | ICD-10-CM

## 2012-02-02 LAB — POCT CBC
Granulocyte percent: 67 %G (ref 37–80)
MID (cbc): 0.4 (ref 0–0.9)
MPV: 9.8 fL (ref 0–99.8)
POC MID %: 5.3 %M (ref 0–12)
Platelet Count, POC: 354 10*3/uL (ref 142–424)
RBC: 3.97 M/uL — AB (ref 4.04–5.48)

## 2012-02-02 NOTE — Progress Notes (Signed)
Subjective:    Patient ID: Brandy [name], female    DOB: 11/06/1955, 56 y.o.   MRN: [id number]  HPI  Brandy [name] is a 56 yr old female here for wound care.  Large abscess on left buttock incised and drained on 01/31/12.  Pt states feeling better than Wednesday.  Is tolerating abx well.  Changes dressing when it becomes saturated, which is several times per day.  Pain is well controlled with tramadol.      Review of Systems  Constitutional: Negative for fever and chills.  Respiratory: Negative.   Cardiovascular: Negative.   Gastrointestinal: Negative.   Genitourinary: Negative.   Musculoskeletal: Negative.   Skin: Positive for wound.  Neurological: Negative.        Objective:   Physical Exam  Vitals reviewed. Constitutional: She is oriented to person, place, and time. She appears well-developed and well-nourished. No distress.  Neurological: She is alert and oriented to person, place, and time.  Skin: Skin is warm and dry.          Wound on left buttock; approximately 10cm of erythema and 6cm of induration around the open wound; slough surrounding wound edges  Psychiatric: She has a normal mood and affect. Her behavior is normal.    Wound Care: Packing removed.  Copious purulent material expressed.  Wound anesthetized with 3cc 2% plain lidocaine.  Necrotic tissue debrided.  Wound irrigated with remaining anesthetic.  Repacked with 1/4" plain packing.  Area cleansed and dressed with non-stick pad.  Pt tolerated well.      Results for orders placed in visit on 02/02/12  POCT CBC      Component Value Range   WBC 7.1  4.6 - 10.2 K/uL   Lymph, poc 2.0  0.6 - 3.4   POC LYMPH PERCENT 27.7  10 - 50 %L   MID (cbc) 0.4  0 - 0.9   POC MID % 5.3  0 - 12 %M   POC Granulocyte 4.8  2 - 6.9   Granulocyte percent 67.0  37 - 80 %G   RBC 3.97 (*) 4.04 - 5.48 M/uL   Hemoglobin 11.6 (*) 12.2 - 16.2 g/dL   HCT, POC 21.3  08.6 - 47.9 %   MCV 97.2 (*) 80 - 97 fL   MCH, POC 29.2  27 - 31.2  pg   MCHC 30.1 (*) 31.8 - 35.4 g/dL   RDW, POC 57.8     Platelet Count, POC 354  142 - 424 K/uL   MPV 9.8  0 - 99.8 fL       Assessment & Plan:   1. Abscess  POCT CBC  2. Cellulitis of buttock      Brandy [name] is a 56 yr old female with cellulitis and abscess of the left buttock.  Erythema and induration reduced from the time of initial I&D.  There is necrotic tissue surrounding the edges of the wound.  I anesthetized the area and debrided the slough down to healthy tissue.  The wound was repacked and a dressing applied.  Culture is preliminarily growing staph but further speciation and susceptibilities are not yet available.  WBC count 7.1 (down from 13.8 two days ago).  Will continue Bactrim given good response until culture data is available.  Discussed wound care with pt.  Discussed the possibility of future debridement and pt voices understanding.  She will continue to use tramadol for pain.  She will RTC tomorrow, 02/03/12 to see Chelle for re-eval and dressing  change.

## 2012-02-02 NOTE — Patient Instructions (Addendum)
Continue the antibiotic as directed.  Change the dressing at least twice daily, or when it becomes saturated.  Return to clinic tomorrow for re-evaluation and repacking by Chelle.

## 2012-02-03 ENCOUNTER — Ambulatory Visit (INDEPENDENT_AMBULATORY_CARE_PROVIDER_SITE_OTHER): Payer: BC Managed Care – PPO | Admitting: Physician Assistant

## 2012-02-03 VITALS — BP 110/60 | HR 85 | Temp 97.6°F | Resp 18 | Ht 66.5 in | Wt 135.8 lb

## 2012-02-03 DIAGNOSIS — L03317 Cellulitis of buttock: Secondary | ICD-10-CM

## 2012-02-03 DIAGNOSIS — L0231 Cutaneous abscess of buttock: Secondary | ICD-10-CM

## 2012-02-03 LAB — WOUND CULTURE

## 2012-02-03 MED ORDER — DOXYCYCLINE HYCLATE 100 MG PO CAPS: 100.0000 mg | ORAL_CAPSULE | Freq: Two times a day (BID) | ORAL | Status: DC

## 2012-02-03 NOTE — Progress Notes (Signed)
Patient examined. Discussed, reviewed and agree.

## 2012-02-03 NOTE — Progress Notes (Signed)
S: This 56 y.o. female presents for evaluation of MRSA cellulitis/abscess of the LEFT buttock.  She was initially evaluated here on 01/29/2012 with cellulitis, but without fluctuence.  Treatment was initiated with Bactrim DS 2 PO BID x 10 days.  She returned on 10/30 with an obvious abscess which was drained and packed.  Upon re-evaluation 11/01 the wound was noted to have large amount of thick necrosis at the wound edges, which was debrided with sharps, and repacked.  Today she presents with decreased, but continued pain.  No fever, chills, nausea, vomiting.  Expresses frustration at having to return "over and over" for wound care.  Wonders if she doesn't need to see a Careers adviser "today to take care of it once and for all."  O: BP 110/60  Pulse 85  Temp 97.6 F (36.4 C) (Oral)  Resp 18  Ht 5' 6.5" (1.689 m)  Wt 135 lb 12.8 oz (61.598 kg)  BMI 21.59 kg/m2  SpO2 99% A&Ox3. Dressing removed-saturated with purulence.  Surrounding skin with some sloughing.  Packing removed and a large amount of purulence drained spontaneously.  There is large amount of yellow eschar in the wound, more than yesterday. She did not tolerate initial debridement, so local anesthesia provided in the surrounding tissues with 8 cc of 2% lidocaine plain.  Sharps debridement to bloody tissue.  Two "pockets" located at 12 o'clock and 9 o'clock.  Repacked with 1/2 inch packing and dressed.  A:  MRSA cellulitis/abscess LEFT buttock  P: I spoke with general surgery who agreed with the following plan: Add Doxycycline to broaden coverage, though it is likely unnecessary.  Continue warm compresses and dressing changes as needed. Reassess here tomorrow.  If needed, she can be seen in the afternoon clinic at [facility] on Monday 02/05/2012.

## 2012-02-03 NOTE — Patient Instructions (Signed)
Continue the Bactrim as before.  Add the new antibiotic (doxycycline), take it with food if it causes nausea.  Use the pain medication if needed.  Avoid sitting on the LEFT buttock.  Apply a warm compress when possible-but apply it to the wound rather than sitting on it.  Change the outer dressing as needed if it becomes saturated or leaks.

## 2012-02-04 ENCOUNTER — Ambulatory Visit (INDEPENDENT_AMBULATORY_CARE_PROVIDER_SITE_OTHER): Payer: BC Managed Care – PPO | Admitting: Physician Assistant

## 2012-02-04 VITALS — BP 130/62 | HR 87 | Temp 97.5°F | Resp 16 | Ht 66.58 in | Wt 134.4 lb

## 2012-02-04 DIAGNOSIS — L03317 Cellulitis of buttock: Secondary | ICD-10-CM

## 2012-02-04 NOTE — Patient Instructions (Addendum)
Try OTC Miralax for your constipation.  Drink at least 64 ounces of water daily.

## 2012-02-05 ENCOUNTER — Ambulatory Visit (INDEPENDENT_AMBULATORY_CARE_PROVIDER_SITE_OTHER): Payer: BC Managed Care – PPO | Admitting: Physician Assistant

## 2012-02-05 VITALS — BP 125/69 | HR 71 | Temp 97.6°F | Resp 16

## 2012-02-05 DIAGNOSIS — L0390 Cellulitis, unspecified: Secondary | ICD-10-CM

## 2012-02-05 DIAGNOSIS — Z23 Encounter for immunization: Secondary | ICD-10-CM

## 2012-02-05 DIAGNOSIS — L0291 Cutaneous abscess, unspecified: Secondary | ICD-10-CM

## 2012-02-05 DIAGNOSIS — S31809A Unspecified open wound of unspecified buttock, initial encounter: Secondary | ICD-10-CM

## 2012-02-05 NOTE — Progress Notes (Signed)
S: This 56 y.o. female presents for evaluation of MRSA cellulitis/abscess of the LEFT buttock.  She was initially evaluated here on 01/29/2012 with cellulitis, but without fluctuence.  Treatment was initiated with Bactrim DS 2 PO BID x 10 days.  She returned on 10/30 with an obvious abscess which was drained and packed.  Upon re-evaluation 11/01 the wound was noted to have large amount of thick necrosis at the wound edges, which was debrided with sharps, and repacked. On 11/02, she had developed large necrotic tissue filling nearly the entire wound cavity.  The surrounding erythema had not worsened, and the area of induration was stable.  The wound was re-anesthetized and debrided again with sharps down to clean, bleeding tissue.  The wound cavity was carefully re-packed with 1/2 inch plain packing to include two pockets found, at 12 o'clock and 9 o'clock.  Doxycycline was added to her regimen as well. I spoke with general surgery, and we were in agreement with the plan.  Today she presents with much decreased, but continued pain.  No fever, chills, nausea, vomiting.  Her husband notes that she is moving around more easily.  O: Vitals noted. A&Ox3. Dressing removed-saturated with serosanguinous fluid.  The erythema surrounding the wound is stable, and the indurated area, while stable in size, is much less tender. Packing removed revealing a clean wound bed.  Some blood from 7-9 o'clock, but no purulence expressed.  Repacked loosely with 1/2 inch packing and dressed.  A:  MRSA cellulitis/abscess LEFT buttock  P: Continue antibiotics, warm compresses and dressing changes as needed. Reassess here tomorrow with Ms. Debbra Riding, [place].  If needed, she can be seen in the afternoon clinic at [facility], but I doubt that will be necessary.

## 2012-02-05 NOTE — Progress Notes (Signed)
  Subjective:    Patient ID: Brandy [name], female    DOB: 02-20-1956, 56 y.o.   MRN: [id number]  HPI  Brandy [name] is a 56 yr old female here for recheck of wound.  The abscess was originally drained on 01/31/12.  The area was sharply debrided on 02/02/12 and 02/03/12.  Culture grew MRSA and doxycycline was added to her antibiotic regimen.  She is tolerating her abx well except for some mild nausea today.  Pain and swelling of the area persists but is significantly decreased from before.  She is changing the dressing once daily, and the wound is draining much less.    Pt requests flu shot  Review of Systems  Constitutional: Positive for appetite change (decreased). Negative for fever and chills.  HENT: Negative.   Respiratory: Negative.   Cardiovascular: Negative.   Gastrointestinal: Positive for nausea.  Genitourinary: Negative.   Musculoskeletal: Negative.   Skin: Positive for wound.  Neurological: Negative.        Objective:   Physical Exam  Vitals reviewed. Constitutional: She is oriented to person, place, and time. She appears well-developed and well-nourished. No distress.  HENT:  Head: Normocephalic and atraumatic.  Neurological: She is alert and oriented to person, place, and time.  Skin: Skin is warm and dry.          Wound of left buttock;  Erythema and induration much reduced, now approximately 5cm down from 10cm; wound bed appears beefy red, no necrotic tissue; some tunneling noted at approx 12 o'clock and 8 o'clock; no purulent material expressed    Filed Vitals:   02/05/12 1032  BP: 125/69  Pulse: 71  Temp: 97.6 F (36.4 C)  Resp: 16     Wound Care: Packing removed, saturated with serosanguinous drainage.  Unable to express any drainage or purulent material from the wound.  Irrigated with 10cc 2% plain lidocaine.  Repacked with 1/2" plain packing, including areas of tunneling.  Non-adherent dressing applied.  Pt tolerated well.       Assessment & Plan:     1. Wound of buttock    2. Abscess    3. Need for influenza vaccination  Flu vaccine greater than or equal to 3yo preservative free IM    Brandy [name] is a 56 yr old female here with an abscess of the left buttock.  She is much improved from when I saw her on Friday.  There is no longer any necrotic tissue.  The wound bed appears healthy.  I could not express any drainage from the wound.  I have repacked the wound with 1/2" plain packing.  I think she is ok to return in 48 hours for repacking.  I have given her a fast pass.  Encouraged her to take abx with food to help prevent GI upset.  Flu shot given.

## 2012-02-07 ENCOUNTER — Ambulatory Visit (INDEPENDENT_AMBULATORY_CARE_PROVIDER_SITE_OTHER): Payer: BC Managed Care – PPO | Admitting: Physician Assistant

## 2012-02-07 VITALS — BP 110/60 | HR 90 | Temp 98.2°F | Resp 16

## 2012-02-07 DIAGNOSIS — S31809A Unspecified open wound of unspecified buttock, initial encounter: Secondary | ICD-10-CM

## 2012-02-07 DIAGNOSIS — L0231 Cutaneous abscess of buttock: Secondary | ICD-10-CM

## 2012-02-07 DIAGNOSIS — L03317 Cellulitis of buttock: Secondary | ICD-10-CM

## 2012-02-07 MED ORDER — TRAMADOL HCL 50 MG PO TABS: 50.0000 mg | ORAL_TABLET | Freq: Four times a day (QID) | ORAL | Status: DC | PRN

## 2012-02-07 NOTE — Progress Notes (Signed)
  Subjective:    Patient ID: Brandy [name], female    DOB: 07-10-1955, 56 y.o.   MRN: [id number]  Wound Check    Ms. Yokley is a 56 yr old female here for recheck of wound.  The abscess was originally drained on 01/31/12.  The area was sharply debrided on 02/02/12 and 02/03/12.  Culture grew MRSA and doxycycline was added to her antibiotic regimen.  She is tolerating her abx well except for some mild nausea.  Pain and swelling of the area persists but is significantly decreased from before.  She is changing the dressing once daily, and the wound is draining much less.     Review of Systems  Constitutional: Positive for appetite change (decreased). Negative for fever and chills.  HENT: Negative.   Respiratory: Negative.   Cardiovascular: Negative.   Gastrointestinal: Positive for nausea.  Genitourinary: Negative.   Musculoskeletal: Negative.   Skin: Positive for wound.  Neurological: Negative.        Objective:   Physical Exam  Vitals reviewed. Constitutional: She is oriented to person, place, and time. She appears well-developed and well-nourished. No distress.  HENT:  Head: Normocephalic and atraumatic.  Neurological: She is alert and oriented to person, place, and time.  Skin: Skin is warm and dry.          Wound of left buttock;  Erythema has resolved, approx 4cm of induration remain; wound bed appears beefy red, no necrotic tissue; some tunneling noted at approx 12 o'clock and 8 o'clock; no purulent material expressed    Filed Vitals:   02/07/12 1042  BP: 110/60  Pulse: 90  Temp: 98.2 F (36.8 C)  Resp: 16     Wound Care: Packing removed, saturated with serosanguinous drainage.  Unable to express any drainage or purulent material from the wound.  Irrigated with 10cc 2% plain lidocaine.  Repacked with 1/2" plain packing, including areas of tunneling.  Non-adherent dressing applied.  Pt tolerated well.       Assessment & Plan:   1. Cellulitis and abscess of  buttock  traMADol (ULTRAM) 50 MG tablet    Ms. Gethers is a 56 yr old female here with an abscess of the left buttock.  She continues to improve.  The wound bed appears healthy; no necrotic tissue.  I could not express any drainage from the wound.  I have repacked the wound with 1/2" plain packing.  I have given her a fast pass to return and see Heather on 02/09/12.  Encouraged her to take abx with food to help prevent GI upset.  Refilled tramadol for pain.  She plans to return to work one week from today.

## 2012-02-09 ENCOUNTER — Ambulatory Visit (INDEPENDENT_AMBULATORY_CARE_PROVIDER_SITE_OTHER): Payer: BC Managed Care – PPO | Admitting: Physician Assistant

## 2012-02-09 VITALS — BP 120/79 | HR 80 | Temp 98.0°F | Resp 16

## 2012-02-09 DIAGNOSIS — L0231 Cutaneous abscess of buttock: Secondary | ICD-10-CM

## 2012-02-09 NOTE — Progress Notes (Signed)
Patient ID: HARJOT ZAVADIL MRN: [id number], DOB: [birth year] 56 y.o. Date of Encounter: 02/09/2012, 11:08 AM  Chief Complaint: Wound care   See previous note  HPI: 56 y.o. y/o female presents for wound care s/p I&D on 01/29/12. Doing well No issues or complaints Afebrile/ no chills No nausea or vomiting Tolerating doxycycline and bactrim. Pain 2/10. Daily dressing change Previous note reviewed  Past Medical History  Diagnosis Date  . Asthmatic bronchitis   . Allergic rhinitis   . Vaginitis   . Vocal cord paresis   . GERD (gastroesophageal reflux disease)      Home Meds: Prior to Admission medications   Medication Sig Start Date End Date Taking? Authorizing Provider  albuterol (PROAIR HFA) 108 (90 BASE) MCG/ACT inhaler Inhale 2 puffs into the lungs every 6 (six) hours as needed for wheezing or shortness of breath. 12/21/11 12/20/12 Yes Clinton D Young, MD  ALPRAZolam Prudy Feeler) 0.25 MG tablet Take 0.25 mg by mouth at bedtime as needed.   Yes Historical Provider, MD  benzonatate (TESSALON) 200 MG capsule TAKE 1 CAPSULE BY MOUTH THREE TIMES     DAILY AS NEEDED 08/03/11  Yes Waymon Budge, MD  budesonide-formoterol (SYMBICORT) 160-4.5 MCG/ACT inhaler Inhale 2 puffs into the lungs 2 (two) times daily. Rinse mouth 12/21/11 02/15/13 Yes Waymon Budge, MD  Calcium Carbonate-Vitamin D (CALCIUM 500 + D) 500-125 MG-UNIT TABS Take 1 tablet by mouth daily.     Yes Historical Provider, MD  cetirizine (ZYRTEC) 10 MG tablet Take 10 mg by mouth daily.     Yes Historical Provider, MD  doxycycline (VIBRAMYCIN) 100 MG capsule Take 1 capsule (100 mg total) by mouth 2 (two) times daily. 02/03/12  Yes Chelle S Jeffery, PA-C  FLUoxetine (PROZAC) 10 MG tablet Take 10 mg by mouth daily.     Yes Historical Provider, MD  fluticasone (FLONASE) 50 MCG/ACT nasal spray USE 1 SPRAY IN EACH NOSTRIL DAILY 07/01/11  Yes Waymon Budge, MD  LORazepam (ATIVAN) 0.5 MG tablet 1-2 tabs every 8 hours as needed 06/12/11  06/11/12 Yes Clinton D Young, MD  methocarbamol (ROBAXIN) 500 MG tablet Take 500 mg by mouth 4 (four) times daily.   Yes Historical Provider, MD  metoprolol succinate (TOPROL-XL) 25 MG 24 hr tablet Take 25 mg by mouth daily.     Yes Historical Provider, MD  sulfamethoxazole-trimethoprim (BACTRIM DS,SEPTRA DS) 800-160 MG per tablet Take 2 tablets by mouth 2 (two) times daily. 01/29/12  Yes Sherren Mocha, MD  traMADol (ULTRAM) 50 MG tablet Take 1 tablet (50 mg total) by mouth every 6 (six) hours as needed for pain. 02/07/12  Yes Godfrey Pick, PA-C    Allergies:  Allergies  Allergen Reactions  . Erythromycin     ROS: Constitutional: Afebrile, no chills Cardiovascular: negative for chest pain or palpitations Dermatological: Positive for wound. Negative for erythema, pain, or warmth.  GI: No nausea or vomiting   EXAM: Physical Exam: Blood pressure 120/79, pulse 80, temperature 98 F (36.7 C), temperature source Oral, resp. rate 16, SpO2 100.00%., There is no height or weight on file to calculate BMI. General: Well developed, well nourished, in no acute distress. Nontoxic appearing. Head: Normocephalic, atraumatic, sclera non-icteric.  Neck: Supple. Lungs: Breathing is unlabored. Heart: Normal rate. Skin:  Warm and moist. Dressing and packing in place. Minimal induration, erythema, or tenderness to palpation. Neuro: Alert and oriented X 3. Moves all extremities spontaneously. Normal gait.  Psych:  Responds to questions appropriately with a  normal affect.       PROCEDURE: Dressing and packing removed. No purulence expressed Wound bed healthy Irrigated with 1% plain lidocaine 5 cc. Loosely packed with 1/4" plain packing. Dressing applied  LAB: Culture:   A/P: 56 y.o. y/o female with buttocks cellulitis/abscess as above s/p I&D on Wound care per above Continue doxycycline and bactrim.  Pain well controlled Daily dressing changes Recheck 48 hours  Signed, Rhoderick Moody,  PA-C 02/09/2012 11:08 AM

## 2012-02-11 ENCOUNTER — Ambulatory Visit (INDEPENDENT_AMBULATORY_CARE_PROVIDER_SITE_OTHER): Payer: BC Managed Care – PPO | Admitting: Emergency Medicine

## 2012-02-11 VITALS — BP 113/73 | HR 88 | Temp 97.9°F | Resp 17 | Ht 67.0 in | Wt 133.0 lb

## 2012-02-11 DIAGNOSIS — L0231 Cutaneous abscess of buttock: Secondary | ICD-10-CM

## 2012-02-11 DIAGNOSIS — R21 Rash and other nonspecific skin eruption: Secondary | ICD-10-CM

## 2012-02-11 NOTE — Progress Notes (Signed)
Subjective:    Patient ID: Brandy [name], female    DOB: October 03, 1955, 56 y.o.   MRN: [id number]  HPI  Brandy [name] is a 56 yr old female here for wound care and recheck.  An abscess on her left buttock was initally drained on 01/31/12.  It has been debrided twice and is now healing well.  Pain is much less.  She is changing the dressing once daily.  There is less drainage on the bandage every time.  She has finished a course of Bactrim and has 3 doses of doxy left.    Yesterday she developed a rash first on her buttocks then spreading to abdomen, face, arms, palms, and flexor surfaces of knees.  The rash is pruritic but not painful.  The lesions are not open or draining.  She denies any oral involvement but states that her mouth has felt funny, which she has attributed to doxycycline.  Has used doxy in the past with no adverse effects.  Not sure if she has ever taken Bactrim before this course.  No new exposures to foods, detergents, soaps, etc.  No fever or chills.  No other systemic symptoms.      Review of Systems  Constitutional: Negative for fever and chills.  Respiratory: Negative.   Cardiovascular: Negative.   Gastrointestinal: Nausea: attributes to abx.  Musculoskeletal: Negative.   Skin: Positive for rash.  Neurological: Negative.        Objective:   Physical Exam  Vitals reviewed. Constitutional: She is oriented to person, place, and time. She appears well-developed and well-nourished. No distress.  HENT:  Mouth/Throat: Uvula is midline, oropharynx is clear and moist and mucous membranes are normal.  Eyes: Conjunctivae normal and lids are normal.  Cardiovascular: Normal rate, regular rhythm and normal heart sounds.   Pulmonary/Chest: Effort normal and breath sounds normal. She has no wheezes. She has no rales.  Neurological: She is alert and oriented to person, place, and time.  Skin: Skin is warm and dry. Rash noted. Rash is urticarial.          Urticarial lesions on  buttocks, behind knees, across lower abdomen, forearms, palms, and forehead; a few lesions appear targetoid; no vesicles or bullae  Wound on L buttock; wound bed healthy beefy red, no drainage, approx 2cm wide, approx 1cm deep  Psychiatric: She has a normal mood and affect. Her behavior is normal.     Filed Vitals:   02/11/12 0959  BP: 113/73  Pulse: 88  Temp: 97.9 F (36.6 C)  Resp: 17        Assessment & Plan:   1. Abscess of buttock   2. Rash     Brandy [name] is a 56 yr old female here for wound care following an I&D of an abscess on 01/31/12.  The wound is much improved and is healing well.  The wound bed is healthy and without necrotic tissue.  The wound accomodates much less packing.  I have irrigated the wound and repacked it with 1/2" plain packing.  It's possible that she will not need to be packed after this.  She will return in 48 hours for wound care/re-check.  Fast pass given.  Concern that her skin eruption is a delayed reaction to TMP/SMX, less likely that it is due to doxy.  Given that she only has three doses of doxy left and has been on antibiotic therapy for 13 days now, will discontinue doxycycline as a precaution.  The pt takes Zyrtec  daily for allergies, encouraged her to add Benadryl Q6hrs.  Instructed pt to use OTC Zantac if not getting good relief from Zyrtec+Benadryl.  Will follow-up on rash in 48 hours when she returns for wound check.  Instructed pt to return if she has worsening rash or involvement of mouth, eyes, or vaginal mucosa.  Pt voiced understanding.  I have added sulfa to her allergy list.  Case discussed with Dr. Cleta Alberts

## 2012-02-13 ENCOUNTER — Ambulatory Visit (INDEPENDENT_AMBULATORY_CARE_PROVIDER_SITE_OTHER): Payer: BC Managed Care – PPO | Admitting: Family Medicine

## 2012-02-13 VITALS — BP 103/80 | HR 80 | Temp 98.4°F | Resp 18 | Wt 134.0 lb

## 2012-02-13 DIAGNOSIS — L03317 Cellulitis of buttock: Secondary | ICD-10-CM

## 2012-02-13 DIAGNOSIS — L0231 Cutaneous abscess of buttock: Secondary | ICD-10-CM

## 2012-02-13 DIAGNOSIS — R21 Rash and other nonspecific skin eruption: Secondary | ICD-10-CM

## 2012-02-13 LAB — POCT CBC
Granulocyte percent: 57.9 %G (ref 37–80)
HCT, POC: 42.7 % (ref 37.7–47.9)
Lymph, poc: 2.8 (ref 0.6–3.4)
MCHC: 29.5 g/dL — AB (ref 31.8–35.4)
MCV: 97.4 fL — AB (ref 80–97)
POC LYMPH PERCENT: 37 %L (ref 10–50)
RDW, POC: 13.5 %

## 2012-02-13 LAB — POCT SEDIMENTATION RATE: POCT SED RATE: 24 mm/hr — AB (ref 0–22)

## 2012-02-13 MED ORDER — RANITIDINE HCL 150 MG PO TABS: 300.0000 mg | ORAL_TABLET | Freq: Two times a day (BID) | ORAL | Status: DC

## 2012-02-13 NOTE — Patient Instructions (Addendum)
Continue changing the dressing as needed.  Continue Zyrtec daily.  Continue Benadryl as frequently as every 4 hours.  Add ranitidine (Zantac) 300mg  twice daily.  We will see you back in 48 hours, sooner if you are worsening or if new symptoms develop.

## 2012-02-13 NOTE — Progress Notes (Signed)
Subjective:    Patient ID: Brandy [name], female    DOB: [birth year], 56 y.o.   MRN: [id number]  HPI   Brandy [name] is a 56 yr old female here for wound care and recheck.  An abscess on her left buttock was initally drained on 01/31/12.  It has been debrided twice and is now healing well.  Pain is much less.  She is changing the dressing once daily.  There is less drainage on the bandage every time.    Sunday she developed a rash first on her buttocks then spreading to abdomen, face, arms, palms, and flexor surfaces of knees.  The rash is pruritic but not painful.  The lesions are not open or draining.  States the rash is spreading.  Very itchy.  Has been taking Zyrtec daily and Benadryl Q4hrs.  Benadryl takes the edge of the itching but does not provide complete relief.    She completed her course of bactrim.  Doxy was discontinued on Sunday.    Review of Systems  Constitutional: Negative for fever and chills.  Respiratory: Negative.   Cardiovascular: Negative.   Gastrointestinal: Nausea: attributes to abx.  Musculoskeletal: Negative.   Skin: Positive for rash.  Neurological: Negative.        Objective:   Physical Exam  Vitals reviewed. Constitutional: She is oriented to person, place, and time. She appears well-developed and well-nourished. No distress.  HENT:  Mouth/Throat: Uvula is midline, oropharynx is clear and moist and mucous membranes are normal.  Eyes: Conjunctivae normal and lids are normal.  Cardiovascular: Normal rate, regular rhythm and normal heart sounds.   Pulmonary/Chest: Effort normal and breath sounds normal. She has no wheezes. She has no rales.  Neurological: She is alert and oriented to person, place, and time.  Skin: Skin is warm and dry. Rash noted. Rash is urticarial.          Urticarial lesions on buttocks, behind knees, across lower abdomen, forearms, palms, and forehead; a few lesions appear targetoid; no vesicles or bullae  Wound on L buttock; wound  bed healthy beefy red, no drainage, approx 2cm wide, approx 1cm deep  Psychiatric: She has a normal mood and affect. Her behavior is normal.     Filed Vitals:   02/13/12 1045  BP: 103/80  Pulse: 80  Temp: 98.4 F (36.9 C)  Resp: 18        Assessment & Plan:   1. Rash  POCT CBC, POCT SEDIMENTATION RATE  2. Abscess of buttock       Brandy [name] is a 56 yr old female here for wound care following an I&D of an abscess on 01/31/12.  The wound is much improved and is healing well.  The wound bed is healthy and without necrotic tissue.  The wound accomodates much less packing.  I have irrigated the wound and repacked it with 1/2" plain packing.  She will return in 48 hours for wound care/re-check.  Fast pass given.  Concern that her skin eruption is a delayed reaction to TMP/SMX, less likely that it is due to doxy.   The rash appears to be spreading but the lesions do not appear to be worsening.  There is no oral involvement.  CBC is reassuring.  ESR is pending.  She has been taking Zyrtec and Benadryl, will add Zantac 300mg  BID.  Will follow-up on rash in 48 hours when she returns for wound check.  Instructed pt to return if she has worsening rash or  involvement of mouth, eyes, or vaginal mucosa.  Pt voiced understanding.   Case discussed with Dr. Clelia Croft

## 2012-02-13 NOTE — Progress Notes (Signed)
Reviewed and agree with assessment and plan.  Results for orders placed in visit on 02/13/12  POCT CBC      Component Value Range   WBC 7.5  4.6 - 10.2 K/uL   Lymph, poc 2.8  0.6 - 3.4   POC LYMPH PERCENT 37.0  10 - 50 %L   MID (cbc) 0.4  0 - 0.9   POC MID % 5.1  0 - 12 %M   POC Granulocyte 4.3  2 - 6.9   Granulocyte percent 57.9  37 - 80 %G   RBC 4.38  4.04 - 5.48 M/uL   Hemoglobin 12.6  12.2 - 16.2 g/dL   HCT, POC 75.6  43.3 - 47.9 %   MCV 97.4 (*) 80 - 97 fL   MCH, POC 28.8  27 - 31.2 pg   MCHC 29.5 (*) 31.8 - 35.4 g/dL   RDW, POC 29.5     Platelet Count, POC 326  142 - 424 K/uL   MPV 9.5  0 - 99.8 fL  POCT SEDIMENTATION RATE      Component Value Range   POCT SED RATE 24 (*) 0 - 22 mm/hr

## 2012-02-15 ENCOUNTER — Ambulatory Visit (INDEPENDENT_AMBULATORY_CARE_PROVIDER_SITE_OTHER): Payer: BC Managed Care – PPO | Admitting: Physician Assistant

## 2012-02-15 VITALS — BP 98/56 | HR 79 | Temp 98.3°F | Resp 16 | Ht 67.0 in | Wt 136.0 lb

## 2012-02-15 DIAGNOSIS — L03317 Cellulitis of buttock: Secondary | ICD-10-CM

## 2012-02-15 DIAGNOSIS — L0231 Cutaneous abscess of buttock: Secondary | ICD-10-CM

## 2012-02-15 DIAGNOSIS — R21 Rash and other nonspecific skin eruption: Secondary | ICD-10-CM

## 2012-02-15 NOTE — Progress Notes (Signed)
  Subjective:    Patient ID: Brandy [name], female    DOB: 1955/06/01, 56 y.o.   MRN: [id number]  Wound Check    Brandy [name] is a 55 yr old female here for wound care and recheck.  An abscess on her left buttock was initally drained on 01/31/12.  It has been debrided twice and is now healing well.  Pain is much less.  She is changing the dressing once daily.  There is less drainage on the bandage every time.    Rash is much improved from two days ago.  Continues to itch but much less so with the addition of Zantac.  She has returned to work this week.  Review of Systems  Constitutional: Negative for fever and chills.  Respiratory: Negative.   Cardiovascular: Negative.   Gastrointestinal: Nausea: attributes to abx.  Musculoskeletal: Negative.   Skin: Positive for rash.  Neurological: Negative.        Objective:   Physical Exam  Vitals reviewed. Constitutional: She is oriented to person, place, and time. She appears well-developed and well-nourished. No distress.  HENT:  Mouth/Throat: Uvula is midline, oropharynx is clear and moist and mucous membranes are normal.  Eyes: Conjunctivae normal and lids are normal.  Cardiovascular: Normal rate, regular rhythm and normal heart sounds.   Pulmonary/Chest: Effort normal and breath sounds normal. She has no wheezes. She has no rales.  Neurological: She is alert and oriented to person, place, and time.  Skin: Skin is warm and dry. Rash noted. Rash is urticarial.          Urticarial lesions have largely resolved  Wound on L buttock; wound bed healthy beefy red, no drainage, approx 1.5cm wide, approx 0.5cm deep  Psychiatric: She has a normal mood and affect. Her behavior is normal.     Filed Vitals:   02/15/12 1548  BP: 98/56  Pulse: 79  Temp: 98.3 F (36.8 C)  Resp: 16        Assessment & Plan:      Brandy [name] is a 56 yr old female here for wound care following an I&D of an abscess on 01/31/12.  The wound is much  improved and is healing well.  The wound bed is healthy and without necrotic tissue.  The wound accomodates much less packing.  I have irrigated the wound and repacked it with a small amoutn 1/4" plain packing.  She will return in 48 hours for wound care/re-check.  Fast pass given.  Rash is much improved with the addition of Zantac.  She will continue antihistamines as needed for itching.  Will re-check when she returns in two days.

## 2012-02-17 ENCOUNTER — Ambulatory Visit (INDEPENDENT_AMBULATORY_CARE_PROVIDER_SITE_OTHER): Payer: BC Managed Care – PPO | Admitting: Physician Assistant

## 2012-02-17 VITALS — BP 118/74 | HR 82 | Temp 97.9°F | Resp 16 | Ht 67.0 in | Wt 135.2 lb

## 2012-02-17 DIAGNOSIS — L0231 Cutaneous abscess of buttock: Secondary | ICD-10-CM

## 2012-02-17 DIAGNOSIS — L03317 Cellulitis of buttock: Secondary | ICD-10-CM

## 2012-02-17 NOTE — Progress Notes (Signed)
Patient ID: Brandy [name] MRN: [id number], DOB: [birth year] 56 y.o. Date of Encounter: 02/17/2012, 10:52 AM  Chief Complaint: Wound care   See previous note  HPI: 56 y.o. y/o female presents for wound care s/p I&D on 01/29/12 Doing well No issues or complaints Afebrile/ no chills No nausea or vomiting Completed doxycycline and bactrim. No pain. Daily dressing change Previous note reviewed  Past Medical History  Diagnosis Date  . Asthmatic bronchitis   . Allergic rhinitis   . Vaginitis   . Vocal cord paresis   . GERD (gastroesophageal reflux disease)      Home Meds: Prior to Admission medications   Medication Sig Start Date End Date Taking? Authorizing Provider  albuterol (PROAIR HFA) 108 (90 BASE) MCG/ACT inhaler Inhale 2 puffs into the lungs every 6 (six) hours as needed for wheezing or shortness of breath. 12/21/11 12/20/12  Waymon Budge, MD  ALPRAZolam Prudy Feeler) 0.25 MG tablet Take 0.25 mg by mouth at bedtime as needed.    Historical Provider, MD  benzonatate (TESSALON) 200 MG capsule TAKE 1 CAPSULE BY MOUTH THREE TIMES     DAILY AS NEEDED 08/03/11   Waymon Budge, MD  budesonide-formoterol (SYMBICORT) 160-4.5 MCG/ACT inhaler Inhale 2 puffs into the lungs 2 (two) times daily. Rinse mouth 12/21/11 02/15/13  Waymon Budge, MD  Calcium Carbonate-Vitamin D (CALCIUM 500 + D) 500-125 MG-UNIT TABS Take 1 tablet by mouth daily.      Historical Provider, MD  cetirizine (ZYRTEC) 10 MG tablet Take 10 mg by mouth daily.      Historical Provider, MD  FLUoxetine (PROZAC) 10 MG tablet Take 10 mg by mouth daily.      Historical Provider, MD  fluticasone (FLONASE) 50 MCG/ACT nasal spray USE 1 SPRAY IN EACH NOSTRIL DAILY 07/01/11   Waymon Budge, MD  LORazepam (ATIVAN) 0.5 MG tablet 1-2 tabs every 8 hours as needed 06/12/11 06/11/12  Waymon Budge, MD  methocarbamol (ROBAXIN) 500 MG tablet Take 500 mg by mouth 4 (four) times daily.    Historical Provider, MD  metoprolol succinate  (TOPROL-XL) 25 MG 24 hr tablet Take 25 mg by mouth daily.      Historical Provider, MD  ranitidine (ZANTAC) 150 MG tablet Take 2 tablets (300 mg total) by mouth 2 (two) times daily. 02/13/12   Eleanore Delia Chimes, PA-C  traMADol (ULTRAM) 50 MG tablet Take 1 tablet (50 mg total) by mouth every 6 (six) hours as needed for pain. 02/07/12   Godfrey Pick, PA-C    Allergies:  Allergies  Allergen Reactions  . Erythromycin   . Sulfa Antibiotics Rash    Urticarial rash developed 1 day after completing a 14 day course of TMP/SMX    ROS: Constitutional: Afebrile, no chills Cardiovascular: negative for chest pain or palpitations Dermatological: Positive for wound. Negative for erythema, pain, or warmth. GI: No nausea or vomiting   EXAM: Physical Exam: There were no vitals taken for this visit., There is no height or weight on file to calculate BMI. General: Well developed, well nourished, in no acute distress. Nontoxic appearing. Head: Normocephalic, atraumatic, sclera non-icteric.  Neck: Supple. Lungs: Breathing is unlabored. Heart: Normal rate. Skin:  Warm and moist. Dressing and packing in place. No induration, erythema, or tenderness to palpation. No urticaria or hives. Neuro: Alert and oriented X 3. Moves all extremities spontaneously. Normal gait.  Psych:  Responds to questions appropriately with a normal affect.       PROCEDURE: Dressing and  packing removed. No purulence expressed Wound bed healthy Irrigated with 1% plain lidocaine 5 cc. Not repacked. Dressing applied  LAB: Culture:   A/P: 56 y.o. y/o female with buttocks cellulitis/abscess as above s/p I&D on Wound care per above Pain well controlled Daily dressing changes Follow up as needed.   Grier Mitts, PA-C 02/17/2012 10:52 AM

## 2012-04-10 ENCOUNTER — Telehealth: Payer: Self-pay | Admitting: Internal Medicine

## 2012-04-10 MED ORDER — CLARITHROMYCIN 500 MG PO TABS: 500.0000 mg | ORAL_TABLET | Freq: Two times a day (BID) | ORAL | Status: DC

## 2012-04-10 NOTE — Telephone Encounter (Signed)
Pt states that she has been dealing with all these symptoms for several weeks. She has a cough with production of green mucus. Lots of congestion, nasal and chest. Throat is very sore from all the of the post nasal drip. Been taking a decongestant with no relief.  Per CY - Biaxin 500mg  1 BID with meals #14. Mucinex DM as needed.  Pt is aware of CY recommendations. Rx will be sent to [facility].

## 2012-04-10 NOTE — Telephone Encounter (Signed)
Returning call.Brandy [name] ° °

## 2012-04-10 NOTE — Telephone Encounter (Signed)
[id number]

## 2012-06-10 ENCOUNTER — Encounter: Payer: Self-pay | Admitting: Internal Medicine

## 2012-06-10 ENCOUNTER — Ambulatory Visit (INDEPENDENT_AMBULATORY_CARE_PROVIDER_SITE_OTHER): Payer: BC Managed Care – PPO | Admitting: Internal Medicine

## 2012-06-10 VITALS — BP 120/62 | HR 70 | Ht 67.0 in | Wt 141.8 lb

## 2012-06-10 DIAGNOSIS — J069 Acute upper respiratory infection, unspecified: Secondary | ICD-10-CM | POA: Insufficient documentation

## 2012-06-10 MED ORDER — AMOXICILLIN-POT CLAVULANATE 500-125 MG PO TABS: ORAL_TABLET | ORAL | Status: DC

## 2012-06-10 NOTE — Patient Instructions (Addendum)
Fluids and supportive care- rest, throat lozenges, mucinex etc- whatever works best for you  Script to hold for augmentin in case needed

## 2012-06-10 NOTE — Progress Notes (Signed)
Patient ID: Brandy [name], female    DOB: Jan 05, 1956, 57 y.o.   MRN: [id number]  HPI 11/24/10-female never smoker followed for allergic rhinitis, asthmatic bronchitis, complicated by GERD, vocal cord paresis/ splint, complete heart block/atrial fibrillation/pacemaker Last here May 19, 2010. Has had an appendectomy since last year. 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 New HVAC system being installed, so construction issues now, but she expects this to be eventually an improvement at her school building..  She had done well but is now starting to notice some head congestion and cough. She continues Symbicort, rescue HFA, and she likes tesalon for cough.. She feels these meds, with generic zyrtec are sufficient.   06/12/11- female never smoker followed for allergic rhinitis, asthmatic bronchitis, complicated by GERD, vocal cord paresis/ splint, complete heart block/atrial fibrillation/pacemaker   PCP Dr Aida Puffer Winter with little laryngitis and no bad colds. Doing well now. Occasional panic attacks related to stress at work. I had diagnosed depression years ago and she has taken Prozac. She asks to try lorazepam.  12/21/11- 16  female never smoker followed for allergic rhinitis, asthmatic bronchitis, complicated by GERD, Hx vocal cord paresis/ splint, complete heart block/atrial fibrillation/pacemaker    PCP Dr Aida Puffer Doing okay at this time; had flare ups with weather changes. Gets flu vaccine at school. Carpet removed from classroom floors now - she thinks that helps a lot. Frequent exposure to colds for her school. In the last few days she has had increased postnasal drip, cough, hoarseness, moving into her chest. Husband has had a bad cold.  Review of Systems-see HPI Constitutional:   No-   weight loss, night sweats, fevers, chills, fatigue, lassitude. HEENT:   No-  headaches, difficulty swallowing, tooth/dental problems, sore throat,       No-  sneezing, itching, ear ache,+  mild-nasal congestion, +post nasal drip,  CV:  No-   chest pain, orthopnea, PND, swelling in lower extremities, anasarca, dizziness, palpitations Resp: No-   shortness of breath with exertion or at rest.              No-   productive cough,  +mild non-productive cough,  No-  coughing up of blood.              No-   change in color of mucus.  No- wheezing.   Skin: No-   rash or lesions. GI:  No-   heartburn, indigestion, abdominal pain, nausea, vomiting,  GU:  MS:  No-   joint pain or swelling.   Neuro- nothing unusual  Psych:  No- change in mood or affect. No depression or anxiety.  No memory loss.    Objective:   Physical Exam General- Alert, Oriented, Affect-appropriate, Distress- none acute Skin- rash-none, lesions- none, excoriation- none Lymphadenopathy- none Head- atraumatic            Eyes- Gross vision intact, PERRLA, conjunctivae clear secretions            Ears- Hearing, canals normal            Nose- C+sniffing, No Septal dev, mucus, polyps, erosion, perforation             Throat- Mallampati II , mucosa -clear, not red , drainage- none, tonsils- atrophic;  voice quality hoarse Neck- flexible , trachea midline, no stridor , thyroid nl, carotid no bruit Chest - symmetrical excursion , unlabored           Heart/CV- RRR , no murmur , no  gallop  , no rub, nl s1 s2                           - JVD- none , edema- none, stasis changes- none, varices- none           Lung- clear to P&A, wheeze- none, cough- + mild , dullness-none, rub- none           Chest wall- +L pacemaker Abd-  Br/ Gen/ Rectal- Not done, not indicated Extrem- cyanosis- none, clubbing, none, atrophy- none, strength- nl Neuro- grossly intact to observation

## 2012-06-10 NOTE — Assessment & Plan Note (Signed)
Pharyngitis with tracheobronchitis. We discussed the role of antibiotics. She will use conservative supportive care. We will give an antibiotic to hold in case she begins to develop a more significant sinusitis or bronchitis as discussed.

## 2012-07-03 ENCOUNTER — Encounter: Payer: Self-pay | Admitting: *Deleted

## 2012-07-03 ENCOUNTER — Encounter: Payer: Self-pay | Admitting: Cardiovascular Disease

## 2012-07-11 NOTE — Progress Notes (Signed)
Subjective:    Patient ID: Brandy [name], female    DOB: 02-26-1956, 57 y.o.   MRN: [id number]  HPI  Infection has gotten much worse. It has come to a head and gotten more painful.  Past Medical History  Diagnosis Date  . Asthmatic bronchitis   . Allergic rhinitis   . Vaginitis   . Vocal cord paresis   . GERD (gastroesophageal reflux disease)   . Complete heart block   . Presence of permanent cardiac pacemaker 08-05-08    Medtronic Adapta  . Nonsustained ventricular tachycardia    Current Outpatient Prescriptions on File Prior to Visit  Medication Sig Dispense Refill  . albuterol (PROAIR HFA) 108 (90 BASE) MCG/ACT inhaler Inhale 2 puffs into the lungs every 6 (six) hours as needed for wheezing or shortness of breath.  1 Inhaler  prn  . ALPRAZolam (XANAX) 0.25 MG tablet Take 0.25 mg by mouth at bedtime as needed.      . benzonatate (TESSALON) 200 MG capsule TAKE 1 CAPSULE BY MOUTH THREE TIMES     DAILY AS NEEDED  30 capsule  2  . budesonide-formoterol (SYMBICORT) 160-4.5 MCG/ACT inhaler Inhale 2 puffs into the lungs 2 (two) times daily. Rinse mouth  1 Inhaler  prn  . Calcium Carbonate-Vitamin D (CALCIUM 500 + D) 500-125 MG-UNIT TABS Take 1 tablet by mouth daily.        . cetirizine (ZYRTEC) 10 MG tablet Take 10 mg by mouth daily.        Marland Kitchen FLUoxetine (PROZAC) 10 MG tablet Take 10 mg by mouth daily.        . fluticasone (FLONASE) 50 MCG/ACT nasal spray USE 1 SPRAY IN EACH NOSTRIL DAILY  1 g  6  . metoprolol succinate (TOPROL-XL) 25 MG 24 hr tablet Take 25 mg by mouth daily.         No current facility-administered medications on file prior to visit.   Allergies  Allergen Reactions  . Erythromycin   . Sulfa Antibiotics Rash    Urticarial rash developed 1 day after completing a 14 day course of TMP/SMX     Review of Systems  Constitutional: Positive for activity change. Negative for fever, chills and diaphoresis.  Musculoskeletal: Positive for myalgias. Negative for joint  swelling and gait problem.  Skin: Positive for color change and rash. Negative for pallor and wound.  Hematological: Negative for adenopathy. Does not bruise/bleed easily.  Psychiatric/Behavioral: Positive for sleep disturbance. The patient is nervous/anxious.       BP 105/65  Pulse 90  Temp(Src) 98.2 F (36.8 C) (Oral)  Resp 16  Ht 5\' 7"  (1.702 m)  Wt 138 lb 6.4 oz (62.778 kg)  BMI 21.67 kg/m2  SpO2 100% Objective:   Physical Exam  Constitutional: She is oriented to person, place, and time. She appears well-developed and well-nourished. No distress.  HENT:  Head: Normocephalic and atraumatic.  Eyes: Conjunctivae are normal. No scleral icterus.  Pulmonary/Chest: Effort normal.  Musculoskeletal: She exhibits no edema.  Neurological: She is alert and oriented to person, place, and time.  Skin: Skin is warm and dry. Lesion noted. She is not diaphoretic. There is erythema.  Psychiatric: She has a normal mood and affect. Her behavior is normal.      Assessment & Plan:  Abscess of buttock - Plan: POCT CBC, Wound culture, cefTRIAXone (ROCEPHIN) injection 1 g  Cellulitis and abscess of buttock - Plan: POCT CBC, Wound culture, cefTRIAXone (ROCEPHIN) injection 1 g  Meds ordered this  encounter  Medications  . cefTRIAXone (ROCEPHIN) injection 1 g    Sig:

## 2012-07-12 ENCOUNTER — Other Ambulatory Visit (HOSPITAL_COMMUNITY): Payer: Self-pay | Admitting: Cardiovascular Disease

## 2012-07-12 DIAGNOSIS — R5383 Other fatigue: Secondary | ICD-10-CM

## 2012-07-12 DIAGNOSIS — I472 Ventricular tachycardia: Secondary | ICD-10-CM

## 2012-07-12 DIAGNOSIS — R609 Edema, unspecified: Secondary | ICD-10-CM

## 2012-07-12 DIAGNOSIS — R0600 Dyspnea, unspecified: Secondary | ICD-10-CM

## 2012-07-12 DIAGNOSIS — Q233 Congenital mitral insufficiency: Secondary | ICD-10-CM

## 2012-07-26 ENCOUNTER — Ambulatory Visit (HOSPITAL_COMMUNITY)
Admission: RE | Admit: 2012-07-26 | Discharge: 2012-07-26 | Disposition: A | Discharge status: Home / Self Care | Payer: BC Managed Care – PPO | Source: Ambulatory Visit | Attending: Cardiovascular Disease | Admitting: Cardiovascular Disease

## 2012-07-26 DIAGNOSIS — Q233 Congenital mitral insufficiency: Secondary | ICD-10-CM

## 2012-07-26 DIAGNOSIS — I472 Ventricular tachycardia, unspecified: Secondary | ICD-10-CM | POA: Insufficient documentation

## 2012-07-26 DIAGNOSIS — R5381 Other malaise: Secondary | ICD-10-CM

## 2012-07-26 DIAGNOSIS — R0989 Other specified symptoms and signs involving the circulatory and respiratory systems: Secondary | ICD-10-CM | POA: Insufficient documentation

## 2012-07-26 DIAGNOSIS — I4729 Other ventricular tachycardia: Secondary | ICD-10-CM | POA: Insufficient documentation

## 2012-07-26 DIAGNOSIS — R0600 Dyspnea, unspecified: Secondary | ICD-10-CM

## 2012-07-26 DIAGNOSIS — R5383 Other fatigue: Secondary | ICD-10-CM | POA: Insufficient documentation

## 2012-07-26 DIAGNOSIS — R609 Edema, unspecified: Secondary | ICD-10-CM | POA: Insufficient documentation

## 2012-07-26 DIAGNOSIS — R0609 Other forms of dyspnea: Secondary | ICD-10-CM | POA: Insufficient documentation

## 2012-07-26 NOTE — Progress Notes (Signed)
Venous Duplex Left lower ext. Completed. Negative for DVT. Chauncy Lean

## 2012-07-26 NOTE — Progress Notes (Signed)
[facility]   2D echo completed 07/26/2012.   Cindy Suhail Peloquin, RDCS  

## 2012-09-11 ENCOUNTER — Other Ambulatory Visit: Payer: Self-pay | Admitting: Internal Medicine

## 2012-09-11 MED ORDER — FLUTICASONE PROPIONATE 50 MCG/ACT NA SUSP: NASAL | Status: DC

## 2012-09-17 ENCOUNTER — Other Ambulatory Visit: Payer: Self-pay | Admitting: Internal Medicine

## 2012-09-17 MED ORDER — BENZONATATE 200 MG PO CAPS: ORAL_CAPSULE | ORAL | Status: DC

## 2012-09-17 NOTE — Telephone Encounter (Signed)
Received faxed refill request from [facility] for tessalon 200mg  Last ov w/ CY 3.10.14, follow up in 6 months >> 9.8.14 Ok to refill per Florentina Addison Refills sent Will sign off

## 2012-09-30 ENCOUNTER — Other Ambulatory Visit: Payer: Self-pay | Admitting: Cardiovascular Disease

## 2012-09-30 DIAGNOSIS — I442 Atrioventricular block, complete: Secondary | ICD-10-CM

## 2012-09-30 DIAGNOSIS — I472 Ventricular tachycardia: Secondary | ICD-10-CM

## 2012-10-02 ENCOUNTER — Encounter: Payer: Self-pay | Admitting: *Deleted

## 2012-10-02 LAB — REMOTE PACEMAKER DEVICE
AL IMPEDENCE PM: 506 Ohm
AL THRESHOLD: 0.75 V
ATRIAL PACING PM: 85.8
BAMS-0001: 175 {beats}/min
RV LEAD IMPEDENCE PM: 596 Ohm
RV LEAD THRESHOLD: 0.625 V

## 2012-12-09 ENCOUNTER — Ambulatory Visit (INDEPENDENT_AMBULATORY_CARE_PROVIDER_SITE_OTHER): Payer: BC Managed Care – PPO | Admitting: Internal Medicine

## 2012-12-09 ENCOUNTER — Encounter: Payer: Self-pay | Admitting: Internal Medicine

## 2012-12-09 VITALS — BP 122/78 | HR 91 | Ht 67.0 in | Wt 140.6 lb

## 2012-12-09 DIAGNOSIS — J209 Acute bronchitis, unspecified: Secondary | ICD-10-CM

## 2012-12-09 DIAGNOSIS — Z23 Encounter for immunization: Secondary | ICD-10-CM

## 2012-12-09 DIAGNOSIS — J302 Other seasonal allergic rhinitis: Secondary | ICD-10-CM

## 2012-12-09 DIAGNOSIS — J309 Allergic rhinitis, unspecified: Secondary | ICD-10-CM

## 2012-12-09 DIAGNOSIS — J042 Acute laryngotracheitis: Secondary | ICD-10-CM

## 2012-12-09 MED ORDER — METHYLPREDNISOLONE ACETATE 80 MG/ML IJ SUSP
80.0000 mg | Freq: Once | INTRAMUSCULAR | Status: AC
  Administered 2012-12-09: 80 mg via INTRAMUSCULAR

## 2012-12-09 MED ORDER — PHENYLEPHRINE HCL 1 % NA SOLN
3.0000 [drp] | Freq: Once | NASAL | Status: AC
  Administered 2012-12-09: 3 [drp] via NASAL

## 2012-12-09 MED ORDER — BUDESONIDE-FORMOTEROL FUMARATE 160-4.5 MCG/ACT IN AERO: 2.0000 | INHALATION_SPRAY | Freq: Two times a day (BID) | RESPIRATORY_TRACT | Status: DC

## 2012-12-09 NOTE — Patient Instructions (Addendum)
Neb neo nasal    Depo 80  Flu vax  Script printed Symbicort

## 2012-12-09 NOTE — Progress Notes (Signed)
Patient ID: Brandy [name], female    DOB: [birth year], 57 y.o.   MRN: [id number]  HPI 11/24/10-female never smoker followed for allergic rhinitis, asthmatic bronchitis, complicated by GERD, vocal cord paresis/ splint, complete heart block/atrial fibrillation/pacemaker Last here May 19, 2010. Has had an appendectomy since last year. 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 New HVAC system being installed, so construction issues now, but she expects this to be eventually an improvement at her school building..  She had done well but is now starting to notice some head congestion and cough. She continues Symbicort, rescue HFA, and she likes tesalon for cough.. She feels these meds, with generic zyrtec are sufficient.   06/12/11- female never smoker followed for allergic rhinitis, asthmatic bronchitis, complicated by GERD, vocal cord paresis/ splint, complete heart block/atrial fibrillation/pacemaker   PCP Dr Aida Puffer Winter with little laryngitis and no bad colds. Doing well now. Occasional panic attacks related to stress at work. I had diagnosed depression years ago and she has taken Prozac. She asks to try lorazepam.  12/21/11- 57  female never smoker followed for allergic rhinitis, asthmatic bronchitis, complicated by GERD, Hx vocal cord paresis/ splint, complete heart block/atrial fibrillation/pacemaker    PCP Dr Aida Puffer Doing okay at this time; had flare ups with weather changes. Gets flu vaccine at school. Carpet removed from classroom floors now - she thinks that helps a lot. Frequent exposure to colds for her school. In the last few days she has had increased postnasal drip, cough, hoarseness, moving into her chest. Husband has had a bad cold.  12/09/12- 57  female never smoker followed for allergic rhinitis, asthmatic bronchitis, complicated by GERD, Hx vocal cord paresis/ splint, complete heart block/atrial fibrillation/pacemaker FOLLOWS FOR: having a flare up for past 10 days-losing  voice, cough, PND. Acute illness, 10 days, laryngitis x5 days. She thinks this is allergy, not a cold. No chest tightness. Some sore throat with postnasal drip.  Review of Systems-see HPI Constitutional:   No-   weight loss, night sweats, fevers, chills, fatigue, lassitude. HEENT:   No-  headaches, difficulty swallowing, tooth/dental problems, sore throat,       No-  sneezing, itching, ear ache,+ mild-nasal congestion, +post nasal drip,  CV:  No-   chest pain, orthopnea, PND, swelling in lower extremities, anasarca, dizziness, palpitations Resp: No-   shortness of breath with exertion or at rest.              No-   productive cough,  no- non-productive cough,  No-  coughing up of blood.              No-   change in color of mucus.  No- wheezing.   Skin: No-   rash or lesions. GI:  No-   heartburn, indigestion, abdominal pain, nausea, vomiting,  GU:  MS:  No-   joint pain or swelling.   Neuro- nothing unusual  Psych:  No- change in mood or affect. No depression or anxiety.  No memory loss.    Objective:   Physical Exam General- Alert, Oriented, Affect-appropriate, Distress- none acute Skin- rash-none, lesions- none, excoriation- none Lymphadenopathy- none Head- atraumatic            Eyes- Gross vision intact, PERRLA, conjunctivae clear secretions            Ears- Hearing, canals normal            Nose- +sniffing, No Septal dev, mucus, polyps, erosion, perforation  Throat- Mallampati II , mucosa -clear, not red , drainage- none, tonsils- atrophic;  +voice quality                      hoarse Neck- flexible , trachea midline, no stridor , thyroid nl, carotid no bruit Chest - symmetrical excursion , unlabored           Heart/CV- RRR , no murmur , no gallop  , no rub, nl s1 s2                           - JVD- none , edema- none, stasis changes- none, varices- none           Lung- clear to P&A, wheeze- none, cough- + mild , dullness-none, rub- none           Chest wall- +L  pacemaker Abd-  Br/ Gen/ Rectal- Not done, not indicated Extrem- cyanosis- none, clubbing, none, atrophy- none, strength- nl Neuro- grossly intact to observation

## 2012-12-15 DIAGNOSIS — J042 Acute laryngotracheitis: Secondary | ICD-10-CM | POA: Insufficient documentation

## 2012-12-15 NOTE — Assessment & Plan Note (Signed)
We discussed sublingual Ragwitek tablets as a future option

## 2012-12-15 NOTE — Assessment & Plan Note (Signed)
Nonspecific acute exacerbation. It could be viral or allergic. Plan-nasal nebulizer decongestant, Depo-Medrol, flu shot

## 2012-12-15 NOTE — Assessment & Plan Note (Signed)
Plan-refill Symbicort with discussion

## 2013-01-01 ENCOUNTER — Ambulatory Visit (INDEPENDENT_AMBULATORY_CARE_PROVIDER_SITE_OTHER): Payer: BC Managed Care – PPO

## 2013-01-01 DIAGNOSIS — I442 Atrioventricular block, complete: Secondary | ICD-10-CM

## 2013-01-01 LAB — PACEMAKER DEVICE OBSERVATION

## 2013-01-10 ENCOUNTER — Encounter: Payer: Self-pay | Admitting: *Deleted

## 2013-01-10 LAB — REMOTE PACEMAKER DEVICE
ATRIAL PACING PM: 85
BATTERY VOLTAGE: 2.79 V
RV LEAD THRESHOLD: 0.5 V

## 2013-02-03 ENCOUNTER — Other Ambulatory Visit: Payer: Self-pay | Admitting: *Deleted

## 2013-02-03 MED ORDER — METOPROLOL SUCCINATE ER 25 MG PO TB24: 25.0000 mg | ORAL_TABLET | Freq: Every day | ORAL | Status: DC

## 2013-02-03 NOTE — Telephone Encounter (Signed)
Rx was sent to pharmacy electronically. 

## 2013-04-07 ENCOUNTER — Ambulatory Visit (INDEPENDENT_AMBULATORY_CARE_PROVIDER_SITE_OTHER): Payer: BC Managed Care – PPO | Admitting: *Deleted

## 2013-04-07 ENCOUNTER — Ambulatory Visit: Payer: BC Managed Care – PPO

## 2013-04-07 DIAGNOSIS — I442 Atrioventricular block, complete: Secondary | ICD-10-CM

## 2013-04-07 LAB — PACEMAKER DEVICE OBSERVATION

## 2013-04-08 LAB — MDC_IDC_ENUM_SESS_TYPE_REMOTE
Battery Impedance: 320 Ohm
Battery Voltage: 2.8 V
Brady Statistic AP VS Percent: 0 %
Brady Statistic AS VP Percent: 15 %
Lead Channel Impedance Value: 459 Ohm
Lead Channel Pacing Threshold Amplitude: 0.5 V
Lead Channel Setting Pacing Amplitude: 1.5 V
Lead Channel Setting Pacing Pulse Width: 0.4 ms
MDC IDC MSMT BATTERY REMAINING LONGEVITY: 98 mo
MDC IDC MSMT LEADCHNL RA PACING THRESHOLD PULSEWIDTH: 0.4 ms
MDC IDC MSMT LEADCHNL RV IMPEDANCE VALUE: 561 Ohm
MDC IDC MSMT LEADCHNL RV PACING THRESHOLD AMPLITUDE: 0.5 V
MDC IDC MSMT LEADCHNL RV PACING THRESHOLD PULSEWIDTH: 0.4 ms
MDC IDC SESS DTM: 20150105233942
MDC IDC SET LEADCHNL RV PACING AMPLITUDE: 2 V
MDC IDC SET LEADCHNL RV SENSING SENSITIVITY: 2.8 mV
MDC IDC STAT BRADY AP VP PERCENT: 85 %
MDC IDC STAT BRADY AS VS PERCENT: 0 %

## 2013-04-11 ENCOUNTER — Telehealth: Payer: Self-pay | Admitting: Internal Medicine

## 2013-04-11 MED ORDER — BENZONATATE 200 MG PO CAPS: ORAL_CAPSULE | ORAL | Status: DC

## 2013-04-11 NOTE — Telephone Encounter (Signed)
Refill electronically sent.

## 2013-04-11 NOTE — Telephone Encounter (Signed)
CY-please advise if okay to refill. Thanks.  

## 2013-04-11 NOTE — Telephone Encounter (Signed)
Ok to refill for 6 months 

## 2013-04-17 ENCOUNTER — Ambulatory Visit (INDEPENDENT_AMBULATORY_CARE_PROVIDER_SITE_OTHER): Payer: BC Managed Care – PPO | Admitting: Family Medicine

## 2013-04-17 VITALS — BP 142/82 | HR 85 | Temp 98.1°F | Resp 16 | Ht 67.0 in | Wt 142.2 lb

## 2013-04-17 DIAGNOSIS — S0083XA Contusion of other part of head, initial encounter: Secondary | ICD-10-CM

## 2013-04-17 DIAGNOSIS — S0033XA Contusion of nose, initial encounter: Secondary | ICD-10-CM

## 2013-04-17 DIAGNOSIS — S0003XA Contusion of scalp, initial encounter: Secondary | ICD-10-CM

## 2013-04-17 DIAGNOSIS — S1093XA Contusion of unspecified part of neck, initial encounter: Secondary | ICD-10-CM

## 2013-04-17 DIAGNOSIS — S00531A Contusion of lip, initial encounter: Secondary | ICD-10-CM

## 2013-04-17 DIAGNOSIS — R040 Epistaxis: Secondary | ICD-10-CM

## 2013-04-17 DIAGNOSIS — S90129A Contusion of unspecified lesser toe(s) without damage to nail, initial encounter: Secondary | ICD-10-CM

## 2013-04-17 NOTE — Progress Notes (Signed)
Subjective: Patient tripped over a dog and fell forward on her face a couple of hours ago. Her nose bled profusely it is stopped.  Objective: She has a bruise across the bridge of her nose. The nose however is not tender when palpated with side to side motion. She has some abrasions in her upper lip which is a little bit swollen. Throat is clear. Neck supple and nontender. Her left foot she bruised solve the toes when they turn under. They are mildly ecchymotic. Not particularly tender however. The metatarsals are fine. The toes are slightly tender.  Assessment: Toe contusion Nasal contusion Epistaxis Lip contusion  Plan: I do not feel like x-rays are needed either of the toes are of the nose. Use ice. Tylenol ibuprofen. Reassurance. Return if concerns. Even if there was a little fracture of the nose it is not out of alignment and wouldn't change therapy.

## 2013-04-17 NOTE — Patient Instructions (Signed)
Continue using ice for a couple of days several times daily  Return if worse concerns  Tylenol or ibuprofen for pain

## 2013-04-20 ENCOUNTER — Encounter: Payer: Self-pay | Admitting: *Deleted

## 2013-05-28 ENCOUNTER — Other Ambulatory Visit: Payer: Self-pay | Admitting: Internal Medicine

## 2013-05-28 MED ORDER — ALBUTEROL SULFATE HFA 108 (90 BASE) MCG/ACT IN AERS: 2.0000 | INHALATION_SPRAY | Freq: Four times a day (QID) | RESPIRATORY_TRACT | Status: DC | PRN

## 2013-05-28 NOTE — Telephone Encounter (Signed)
Rx refill sent electronically

## 2013-06-09 ENCOUNTER — Ambulatory Visit (INDEPENDENT_AMBULATORY_CARE_PROVIDER_SITE_OTHER): Payer: BC Managed Care – PPO | Admitting: Cardiovascular Disease

## 2013-06-09 ENCOUNTER — Encounter: Payer: Self-pay | Admitting: Cardiovascular Disease

## 2013-06-09 VITALS — BP 126/80 | HR 73 | Resp 16 | Ht 67.0 in | Wt 143.0 lb

## 2013-06-09 DIAGNOSIS — I442 Atrioventricular block, complete: Secondary | ICD-10-CM | POA: Insufficient documentation

## 2013-06-09 DIAGNOSIS — Z950 Presence of cardiac pacemaker: Secondary | ICD-10-CM

## 2013-06-09 DIAGNOSIS — I472 Ventricular tachycardia, unspecified: Secondary | ICD-10-CM

## 2013-06-09 DIAGNOSIS — I4729 Other ventricular tachycardia: Secondary | ICD-10-CM | POA: Insufficient documentation

## 2013-06-09 LAB — MDC_IDC_ENUM_SESS_TYPE_INCLINIC
Battery Impedance: 320 Ohm
Brady Statistic AP VS Percent: 0 %
Brady Statistic AS VS Percent: 0 %
Date Time Interrogation Session: 20150309194139
Lead Channel Impedance Value: 539 Ohm
Lead Channel Pacing Threshold Amplitude: 0.5 V
Lead Channel Pacing Threshold Pulse Width: 0.4 ms
Lead Channel Pacing Threshold Pulse Width: 0.4 ms
Lead Channel Sensing Intrinsic Amplitude: 8 mV
Lead Channel Setting Sensing Sensitivity: 2.8 mV
MDC IDC MSMT BATTERY REMAINING LONGEVITY: 98 mo
MDC IDC MSMT BATTERY VOLTAGE: 2.8 V
MDC IDC MSMT LEADCHNL RA IMPEDANCE VALUE: 452 Ohm
MDC IDC MSMT LEADCHNL RA PACING THRESHOLD AMPLITUDE: 0.5 V
MDC IDC MSMT LEADCHNL RA SENSING INTR AMPL: 2.8 mV
MDC IDC SET LEADCHNL RA PACING AMPLITUDE: 2 V
MDC IDC SET LEADCHNL RV PACING AMPLITUDE: 2.5 V
MDC IDC SET LEADCHNL RV PACING PULSEWIDTH: 0.4 ms
MDC IDC STAT BRADY AP VP PERCENT: 85 %
MDC IDC STAT BRADY AS VP PERCENT: 15 %

## 2013-06-09 LAB — PACEMAKER DEVICE OBSERVATION

## 2013-06-09 NOTE — Progress Notes (Signed)
Patient ID: Brandy [name], female   DOB: 23-Sep-1955, 58 y.o.   MRN: [id number]      Reason for office visit Complete heart block, pacemaker check   Mrs. Savino has done very well since her last appointment. She has high-grade atrioventricular block that is progressed to complete heart block and a normally functioning dual-chamber permanent pacemaker. She has not had any episodes of syncope or palpitations, although her pacemaker occasionally reports episodes of nonsustained ventricular tachycardia. These are never symptomatic. She does not have any known structural heart disease and denies problem with chest pain or shortness of breath. Occasionally at the end of a long day she'll have some aching between her shoulder blades but this does not happen while she is exerting herself and most likely represents muscle tension. She requires bronchodilators for reactive airway disease.  Allergies  Allergen Reactions  . Erythromycin   . Sulfa Antibiotics Rash    Urticarial rash developed 1 day after completing a 14 day course of TMP/SMX    Current Outpatient Prescriptions  Medication Sig Dispense Refill  . albuterol (PROAIR HFA) 108 (90 BASE) MCG/ACT inhaler Inhale 2 puffs into the lungs every 6 (six) hours as needed for wheezing or shortness of breath.  1 Inhaler  5  . ALPRAZolam (XANAX) 0.25 MG tablet Take 0.25 mg by mouth at bedtime as needed.      . benzonatate (TESSALON) 200 MG capsule TAKE 1 CAPSULE BY MOUTH THREE TIMES DAILY AS NEEDED  30 capsule  5  . budesonide-formoterol (SYMBICORT) 160-4.5 MCG/ACT inhaler Inhale 2 puffs into the lungs 2 (two) times daily. Rinse mouth  1 Inhaler  prn  . Calcium Carbonate-Vitamin D (CALCIUM 500 + D) 500-125 MG-UNIT TABS Take 1 tablet by mouth daily.        . cetirizine (ZYRTEC) 10 MG tablet Take 10 mg by mouth daily.        Marland Kitchen FLUoxetine (PROZAC) 10 MG tablet Take 10 mg by mouth daily.        . fluticasone (FLONASE) 50 MCG/ACT nasal spray USE 1 SPRAY IN  EACH NOSTRIL DAILY  1 g  6  . metoprolol succinate (TOPROL-XL) 25 MG 24 hr tablet Take 1 tablet (25 mg total) by mouth daily.  30 tablet  5   No current facility-administered medications for this visit.    Past Medical History  Diagnosis Date  . Asthmatic bronchitis   . Allergic rhinitis   . Vaginitis   . Vocal cord paresis   . GERD (gastroesophageal reflux disease)   . Complete heart block   . Presence of permanent cardiac pacemaker 08-05-08    Medtronic Adapta  . Nonsustained ventricular tachycardia     Past Surgical History  Procedure Laterality Date  . Vocal cord splint  2005  . Dilation and curettage of uterus  1978  . Hand surgery    . Pacemaker insertion  08/05/08    Medtronic Adapta  . Appendectomy  05-2010    Family History  Problem Relation Age of Onset  . Hypertension Mother   . Coronary artery disease Mother   . Diabetes Father   . Heart disease Father   . Hypertension Sister     History   Social History  . Marital Status: Married    Spouse Name: N/A    Number of Children: N/A  . Years of Education: N/A   Occupational History  . Not on file.   Social History Main Topics  . Smoking status: Never Smoker   .  Smokeless tobacco: Not on file  . Alcohol Use: Yes  . Drug Use: Not on file  . Sexual Activity: Not on file   Other Topics Concern  . Not on file   Social History Narrative  . No narrative on file    Review of systems: The patient specifically denies any chest pain at rest or with exertion, dyspnea at rest or with exertion, orthopnea, paroxysmal nocturnal dyspnea, syncope, palpitations, focal neurological deficits, intermittent claudication, lower extremity edema, unexplained weight gain, cough, hemoptysis or wheezing.  The patient also denies abdominal pain, nausea, vomiting, dysphagia, diarrhea, constipation, polyuria, polydipsia, dysuria, hematuria, frequency, urgency, abnormal bleeding or bruising, fever, chills, unexpected weight changes,  mood swings, change in skin or hair texture, change in voice quality, auditory or visual problems, allergic reactions or rashes, new musculoskeletal complaints other than usual "aches and pains".   PHYSICAL EXAM BP 126/80  Pulse 73  Resp 16  Ht 5\' 7"  (1.702 m)  Wt 64.864 kg (143 lb)  BMI 22.39 kg/m2  General: Alert, oriented x3, no distress Head: no evidence of trauma, PERRL, EOMI, no exophtalmos or lid lag, no myxedema, no xanthelasma; normal ears, nose and oropharynx Neck: normal jugular venous pulsations and no hepatojugular reflux; brisk carotid pulses without delay and no carotid bruits Chest: clear to auscultation, no signs of consolidation by percussion or palpation, normal fremitus, symmetrical and full respiratory excursions; healthy appearing left subclavian pacemaker site Cardiovascular: normal position and quality of the apical impulse, regular rhythm, normal first and paradoxically split second heart sounds, no murmurs, rubs or gallops Abdomen: no tenderness or distention, no masses by palpation, no abnormal pulsatility or arterial bruits, normal bowel sounds, no hepatosplenomegaly Extremities: no clubbing, cyanosis or edema; 2+ radial, ulnar and brachial pulses bilaterally; 2+ right femoral, posterior tibial and dorsalis pedis pulses; 2+ left femoral, posterior tibial and dorsalis pedis pulses; no subclavian or femoral bruits Neurological: grossly nonfocal   EKG: AV sequential pacing  Lipid Panel  No results found for this basename: chol, trig, hdl, cholhdl, vldl, ldlcalc    BMET    Component Value Date/Time   NA 132* 05/29/2010 1421   K 3.4* 05/29/2010 1421   CL 97 05/29/2010 1421   CO2 27 10/02/2007 2022   GLUCOSE 109* 05/29/2010 1421   BUN 13 05/29/2010 1421   CREATININE 1.2 05/29/2010 1421   CALCIUM 9.7 10/02/2007 2022     ASSESSMENT AND PLAN NSVT (nonsustained ventricular tachycardia) Asymptomatic, infrequent nonsustained VT up to 3 seconds in duration. This may be  related to treatment with bronchodilators for reactive airway disease. She is on Symbicort. It is important that she not exceed the prescribed dose of this medication. For the same reason I am reluctant to increase the dose of her beta blocker.  CHB (complete heart block) Although technically not pacemaker dependent (she does have a ventricular escape rhythm at around 50 beats per minute) she should avoid strong magnetic fields.  Pacemaker - Medtronic dual-chamber implanted 2010 Normal device function. Virtually 100% atrial and ventricular pacing. No reprogramming performed today. Excellent generator and lead parameters.   Holli Humbles, MD, [place] [phone] office [phone] pager

## 2013-06-09 NOTE — Assessment & Plan Note (Signed)
Asymptomatic, infrequent nonsustained VT up to 3 seconds in duration. This may be related to treatment with bronchodilators for reactive airway disease. She is on Symbicort. It is important that she not exceed the prescribed dose of this medication. For the same reason I am reluctant to increase the dose of her beta blocker.

## 2013-06-09 NOTE — Assessment & Plan Note (Signed)
Although technically not pacemaker dependent (she does have a ventricular escape rhythm at around 50 beats per minute) she should avoid strong magnetic fields.

## 2013-06-09 NOTE — Patient Instructions (Signed)
Remote monitoring is used to monitor your pacemaker from home. This monitoring reduces the number of office visits required to check your device to one time per year. It allows [place] to keep an eye on the functioning of your device to ensure it is working properly. You are scheduled for a device check from home on 09-10-2013. You may send your transmission at any time that day. If you have a wireless device, the transmission will be sent automatically. After your physician reviews your transmission, you will receive a postcard with your next transmission date.  Your physician recommends that you schedule a follow-up appointment in: 12 months with Dr.Croitoru

## 2013-06-09 NOTE — Assessment & Plan Note (Signed)
Normal device function. Virtually 100% atrial and ventricular pacing. No reprogramming performed today. Excellent generator and lead parameters.

## 2013-07-21 ENCOUNTER — Other Ambulatory Visit: Payer: Self-pay | Admitting: Cardiovascular Disease

## 2013-07-21 NOTE — Telephone Encounter (Signed)
Rx was sent to pharmacy electronically. 

## 2013-09-10 ENCOUNTER — Encounter: Payer: BC Managed Care – PPO | Admitting: *Deleted

## 2013-09-10 ENCOUNTER — Telehealth: Payer: Self-pay | Admitting: Cardiology

## 2013-09-10 NOTE — Telephone Encounter (Signed)
Left message with pt husband to remind pt of remote transmission.

## 2013-09-11 ENCOUNTER — Encounter: Payer: Self-pay | Admitting: Cardiology

## 2013-09-17 ENCOUNTER — Encounter: Payer: Self-pay | Admitting: Cardiovascular Disease

## 2013-09-17 ENCOUNTER — Ambulatory Visit (INDEPENDENT_AMBULATORY_CARE_PROVIDER_SITE_OTHER): Payer: BC Managed Care – PPO | Admitting: *Deleted

## 2013-09-17 DIAGNOSIS — I442 Atrioventricular block, complete: Secondary | ICD-10-CM

## 2013-09-18 NOTE — Progress Notes (Signed)
Remote pacemaker transmission.   

## 2013-09-24 LAB — MDC_IDC_ENUM_SESS_TYPE_REMOTE
Battery Remaining Longevity: 84 mo
Battery Voltage: 2.8 V
Brady Statistic AS VP Percent: 15 %
Lead Channel Impedance Value: 423 Ohm
Lead Channel Pacing Threshold Amplitude: 0.625 V
Lead Channel Setting Pacing Amplitude: 2 V
Lead Channel Setting Sensing Sensitivity: 2.8 mV
MDC IDC MSMT BATTERY IMPEDANCE: 368 Ohm
MDC IDC MSMT LEADCHNL RA PACING THRESHOLD AMPLITUDE: 0.5 V
MDC IDC MSMT LEADCHNL RA PACING THRESHOLD PULSEWIDTH: 0.4 ms
MDC IDC MSMT LEADCHNL RV IMPEDANCE VALUE: 489 Ohm
MDC IDC MSMT LEADCHNL RV PACING THRESHOLD PULSEWIDTH: 0.4 ms
MDC IDC SESS DTM: 20150617212437
MDC IDC SET LEADCHNL RV PACING AMPLITUDE: 2.5 V
MDC IDC SET LEADCHNL RV PACING PULSEWIDTH: 0.4 ms
MDC IDC STAT BRADY AP VP PERCENT: 85 %
MDC IDC STAT BRADY AP VS PERCENT: 0 %
MDC IDC STAT BRADY AS VS PERCENT: 0 %

## 2013-10-02 ENCOUNTER — Other Ambulatory Visit: Payer: Self-pay | Admitting: *Deleted

## 2013-10-02 MED ORDER — FLUTICASONE PROPIONATE 50 MCG/ACT NA SUSP: NASAL | Status: DC

## 2013-10-15 ENCOUNTER — Encounter: Payer: Self-pay | Admitting: Cardiology

## 2013-10-20 ENCOUNTER — Telehealth: Payer: Self-pay | Admitting: Cardiovascular Disease

## 2013-10-20 NOTE — Telephone Encounter (Signed)
Pt did pacer transmission in June and still have not heard from it.

## 2013-10-21 NOTE — Telephone Encounter (Signed)
Patient aware that remote was received.

## 2013-12-23 ENCOUNTER — Ambulatory Visit (INDEPENDENT_AMBULATORY_CARE_PROVIDER_SITE_OTHER): Payer: BC Managed Care – PPO | Admitting: *Deleted

## 2013-12-23 ENCOUNTER — Telehealth: Payer: Self-pay | Admitting: Cardiology

## 2013-12-23 ENCOUNTER — Encounter: Payer: Self-pay | Admitting: Cardiovascular Disease

## 2013-12-23 DIAGNOSIS — I442 Atrioventricular block, complete: Secondary | ICD-10-CM

## 2013-12-23 NOTE — Telephone Encounter (Signed)
Confirmed remote transmission with pt husband.  

## 2013-12-24 NOTE — Progress Notes (Signed)
Remote pacemaker transmission.   

## 2013-12-26 LAB — MDC_IDC_ENUM_SESS_TYPE_REMOTE
Brady Statistic AS VS Percent: 0 %
Date Time Interrogation Session: 20150923000507
Lead Channel Impedance Value: 582 Ohm
Lead Channel Pacing Threshold Amplitude: 0.5 V
Lead Channel Pacing Threshold Pulse Width: 0.4 ms
Lead Channel Pacing Threshold Pulse Width: 0.4 ms
Lead Channel Setting Pacing Pulse Width: 0.4 ms
MDC IDC MSMT BATTERY IMPEDANCE: 416 Ohm
MDC IDC MSMT BATTERY REMAINING LONGEVITY: 78 mo
MDC IDC MSMT BATTERY VOLTAGE: 2.79 V
MDC IDC MSMT LEADCHNL RA IMPEDANCE VALUE: 434 Ohm
MDC IDC MSMT LEADCHNL RA PACING THRESHOLD AMPLITUDE: 0.5 V
MDC IDC SET LEADCHNL RA PACING AMPLITUDE: 2 V
MDC IDC SET LEADCHNL RV PACING AMPLITUDE: 2.5 V
MDC IDC SET LEADCHNL RV SENSING SENSITIVITY: 2.8 mV
MDC IDC STAT BRADY AP VP PERCENT: 85 %
MDC IDC STAT BRADY AP VS PERCENT: 0 %
MDC IDC STAT BRADY AS VP PERCENT: 15 %

## 2014-01-01 ENCOUNTER — Encounter: Payer: Self-pay | Admitting: Cardiology

## 2014-01-29 ENCOUNTER — Other Ambulatory Visit: Payer: Self-pay | Admitting: Internal Medicine

## 2014-01-29 DIAGNOSIS — J4520 Mild intermittent asthma, uncomplicated: Secondary | ICD-10-CM

## 2014-01-29 MED ORDER — BUDESONIDE-FORMOTEROL FUMARATE 160-4.5 MCG/ACT IN AERO: 2.0000 | INHALATION_SPRAY | Freq: Two times a day (BID) | RESPIRATORY_TRACT | Status: DC

## 2014-01-29 NOTE — Telephone Encounter (Signed)
Refills approved for Symbicort by CY. Patient does need office visit before future refills.

## 2014-03-30 ENCOUNTER — Ambulatory Visit (INDEPENDENT_AMBULATORY_CARE_PROVIDER_SITE_OTHER): Payer: BC Managed Care – PPO | Admitting: *Deleted

## 2014-03-30 ENCOUNTER — Encounter: Payer: Self-pay | Admitting: Cardiovascular Disease

## 2014-03-30 DIAGNOSIS — I4729 Other ventricular tachycardia: Secondary | ICD-10-CM

## 2014-03-30 DIAGNOSIS — I472 Ventricular tachycardia: Secondary | ICD-10-CM

## 2014-03-30 DIAGNOSIS — I442 Atrioventricular block, complete: Secondary | ICD-10-CM

## 2014-03-30 LAB — MDC_IDC_ENUM_SESS_TYPE_REMOTE
Battery Voltage: 2.8 V
Brady Statistic AP VS Percent: 0 %
Brady Statistic AS VP Percent: 16 %
Date Time Interrogation Session: 20151228140515
Lead Channel Impedance Value: 595 Ohm
Lead Channel Pacing Threshold Amplitude: 0.5 V
Lead Channel Pacing Threshold Amplitude: 0.5 V
Lead Channel Pacing Threshold Pulse Width: 0.4 ms
Lead Channel Setting Pacing Amplitude: 2 V
Lead Channel Setting Sensing Sensitivity: 2.8 mV
MDC IDC MSMT BATTERY IMPEDANCE: 440 Ohm
MDC IDC MSMT BATTERY REMAINING LONGEVITY: 76 mo
MDC IDC MSMT LEADCHNL RA IMPEDANCE VALUE: 446 Ohm
MDC IDC MSMT LEADCHNL RA PACING THRESHOLD PULSEWIDTH: 0.4 ms
MDC IDC SET LEADCHNL RV PACING AMPLITUDE: 2.5 V
MDC IDC SET LEADCHNL RV PACING PULSEWIDTH: 0.4 ms
MDC IDC STAT BRADY AP VP PERCENT: 84 %
MDC IDC STAT BRADY AS VS PERCENT: 0 %

## 2014-03-30 NOTE — Progress Notes (Signed)
Remote pacemaker check. 

## 2014-04-14 ENCOUNTER — Telehealth: Payer: Self-pay | Admitting: *Deleted

## 2014-04-23 ENCOUNTER — Telehealth: Payer: Self-pay | Admitting: *Deleted

## 2014-04-23 NOTE — Telephone Encounter (Signed)
See telephone note dated 1/21. sss

## 2014-04-23 NOTE — Telephone Encounter (Signed)
Informed pt that [facility] wants her to take an ASA 81mg  due to PAF. Patient voiced understanding.

## 2014-04-23 NOTE — Telephone Encounter (Signed)
LMTCB//sss 

## 2014-04-30 ENCOUNTER — Ambulatory Visit (INDEPENDENT_AMBULATORY_CARE_PROVIDER_SITE_OTHER): Payer: BC Managed Care – PPO | Admitting: Physician Assistant

## 2014-04-30 VITALS — BP 133/85 | HR 73 | Temp 97.6°F | Resp 18 | Wt 147.0 lb

## 2014-04-30 DIAGNOSIS — Z23 Encounter for immunization: Secondary | ICD-10-CM

## 2014-04-30 NOTE — Progress Notes (Signed)
   Subjective:    Patient ID: Brandy [name], female    DOB: 29-Mar-1956, 59 y.o.   MRN: [id number]  HPI Patient presents for immunizations with Hep A and B as she is traveling to [place] for a mission trip. Has never had immunizations before. No recent illness reported. Med allergies: Erythromycin and sulfa antibiotics.   Review of Systems  Constitutional: Negative for fever and chills.  Neurological: Negative for headaches.       Objective:   Physical Exam  Constitutional: She appears well-developed and well-nourished. No distress.  Blood pressure 133/85, pulse 73, temperature 97.6 F (36.4 C), temperature source Oral, resp. rate 18, weight 147 lb (66.679 kg), SpO2 100 %.  HENT:  Head: Normocephalic and atraumatic.  Right Ear: External ear normal.  Left Ear: External ear normal.  Eyes: Right eye exhibits no discharge. Left eye exhibits no discharge. No scleral icterus.  Cardiovascular: Normal rate, regular rhythm and normal heart sounds.  Exam reveals no gallop and no friction rub.   No murmur heard. Pulmonary/Chest: Effort normal and breath sounds normal. No respiratory distress. She has no wheezes. She has no rales.  Skin: She is not diaphoretic.       Assessment & Plan:  1. Need for prophylactic vaccination and inoculation against viral hepatitis - Hepatitis A vaccine adult IM - Hepatitis B vaccine adult IM   Dominick Morella PA-C  [facility] 04/30/2014 12:32 PM

## 2014-05-28 ENCOUNTER — Other Ambulatory Visit: Payer: Self-pay | Admitting: Dermatology

## 2014-06-01 ENCOUNTER — Ambulatory Visit (INDEPENDENT_AMBULATORY_CARE_PROVIDER_SITE_OTHER): Payer: BC Managed Care – PPO | Admitting: Physician Assistant

## 2014-06-01 DIAGNOSIS — Z23 Encounter for immunization: Secondary | ICD-10-CM

## 2014-06-16 ENCOUNTER — Encounter: Payer: BC Managed Care – PPO | Admitting: Cardiovascular Disease

## 2014-06-23 ENCOUNTER — Encounter: Payer: Self-pay | Admitting: Cardiovascular Disease

## 2014-06-23 ENCOUNTER — Ambulatory Visit (INDEPENDENT_AMBULATORY_CARE_PROVIDER_SITE_OTHER): Payer: BC Managed Care – PPO | Admitting: Cardiovascular Disease

## 2014-06-23 VITALS — BP 138/77 | HR 76 | Resp 16 | Ht 67.0 in | Wt 145.7 lb

## 2014-06-23 DIAGNOSIS — M79662 Pain in left lower leg: Secondary | ICD-10-CM | POA: Diagnosis not present

## 2014-06-23 DIAGNOSIS — I472 Ventricular tachycardia: Secondary | ICD-10-CM | POA: Diagnosis not present

## 2014-06-23 DIAGNOSIS — Z950 Presence of cardiac pacemaker: Secondary | ICD-10-CM | POA: Diagnosis not present

## 2014-06-23 DIAGNOSIS — I442 Atrioventricular block, complete: Secondary | ICD-10-CM

## 2014-06-23 DIAGNOSIS — I4729 Other ventricular tachycardia: Secondary | ICD-10-CM

## 2014-06-23 NOTE — Progress Notes (Signed)
Patient ID: Brandy [name], female   DOB: 1956-02-19, 59 y.o.   MRN: [id number]     Cardiology Office Note   Date:  06/23/2014   ID:  Brandy [name] 19-May-1955, MRN [id number]  PCP:  Tamsen Roers, MD  Cardiologist:   Sanda Klein, MD   chief complaint: left leg edema, pacemaker check     History of Present Illness: Brandy [name] is a 59 y.o. female who presents for complete heart block and pacemaker check. She just returned from a mission trip to [place] and developed left calf swelling with mild discomfort during the flight. The swelling has improved and the calf is no longer uncomfortable, but there is still clear leg asymmetry.  She denies dyspnea, cough, hemoptysis or pleuritic pain.  She has high-grade atrioventricular block that has progressed to complete heart block and a normally functioning dual-chamber permanent pacemaker. She has not had any episodes of syncope or palpitations, although her pacemaker occasionally reports episodes of nonsustained ventricular tachycardia. These are never symptomatic. She does not have any known structural heart disease and denies problem with chest pain or shortness of breath. She requires bronchodilators for reactive airway disease.  As before, her pacemaker discloses brief (maximum 9 seconds) episodes of nonsustained VT. She has now also developed atrial fibrillation. She had a 14 hour episode in January and a 28 hour episode on March 10. Overall burden is 2.3% (other episodes are atrial tachycardia). 84% A paced, >99% V paced. Normal lead parameters, generator longevity 5-7 years.  She does not have HTN, DM, CHF, history of CVA/TIA or other embolic events. She started ASA in January, when AF was first detected.  She sometimes has palpitations, likely reflecting the VT, not the AFib.   Past Medical History  Diagnosis Date  . Asthmatic bronchitis   . Allergic rhinitis   . Vaginitis   . Vocal cord paresis   . GERD  (gastroesophageal reflux disease)   . Complete heart block   . Presence of permanent cardiac pacemaker 08-05-08    Medtronic Adapta  . Nonsustained ventricular tachycardia     Past Surgical History  Procedure Laterality Date  . Vocal cord splint  2005  . Dilation and curettage of uterus  1978  . Hand surgery    . Pacemaker insertion  08/05/08    Medtronic Adapta  . Appendectomy  05-2010     Current Outpatient Prescriptions  Medication Sig Dispense Refill  . ALPRAZolam (XANAX) 0.25 MG tablet Take 0.25 mg by mouth at bedtime as needed.    Marland Kitchen aspirin 81 MG tablet Take 81 mg by mouth daily.    . benzonatate (TESSALON) 200 MG capsule TAKE 1 CAPSULE BY MOUTH THREE TIMES DAILY AS NEEDED 30 capsule 5  . budesonide-formoterol (SYMBICORT) 160-4.5 MCG/ACT inhaler Inhale 2 puffs into the lungs 2 (two) times daily. Rinse mouth 1 Inhaler prn  . Calcium Carbonate-Vitamin D (CALCIUM 500 + D) 500-125 MG-UNIT TABS Take 1 tablet by mouth daily.      . cetirizine (ZYRTEC) 10 MG tablet Take 10 mg by mouth daily.      Marland Kitchen FLUoxetine (PROZAC) 10 MG tablet Take 10 mg by mouth daily.      . fluticasone (FLONASE) 50 MCG/ACT nasal spray USE 1 SPRAY IN EACH NOSTRIL DAILY 1 g 6  . metoprolol succinate (TOPROL-XL) 25 MG 24 hr tablet TAKE 1 TABLET BY MOUTH ONCE DAILY 30 tablet 10  . albuterol (PROAIR HFA) 108 (90 BASE) MCG/ACT inhaler Inhale 2 puffs into  the lungs every 6 (six) hours as needed for wheezing or shortness of breath. 1 Inhaler 5   No current facility-administered medications for this visit.    Allergies:   Erythromycin and Sulfa antibiotics    Social History:  The patient  reports that she has never smoked. She does not have any smokeless tobacco history on file. She reports that she drinks alcohol.   Family History:  The patient's family history includes Coronary artery disease in her mother; Diabetes in her father; Heart disease in her father; Hypertension in her mother and sister.    ROS:  Please  see the history of present illness.    Otherwise, review of systems positive for leg swelling on the left, palpitations. The patient specifically denies any chest pain at rest or with exertion, dyspnea at rest or with exertion, orthopnea, paroxysmal nocturnal dyspnea, syncope, focal neurological deficits, intermittent claudication, unexplained weight gain, cough, hemoptysis or wheezing.  The patient also denies abdominal pain, nausea, vomiting, dysphagia, diarrhea, constipation, polyuria, polydipsia, dysuria, hematuria, frequency, urgency, abnormal bleeding or bruising, fever, chills, unexpected weight changes, mood swings, change in skin or hair texture, change in voice quality, auditory or visual problems, allergic reactions or rashes, new musculoskeletal complaints other than usual "aches and pains".    All other systems are reviewed and negative.    PHYSICAL EXAM: VS:  BP 138/77 mmHg  Pulse 76  Resp 16  Ht 5\' 7"  (1.702 m)  Wt 145 lb 11.2 oz (66.089 kg)  BMI 22.81 kg/m2 , BMI Body mass index is 22.81 kg/(m^2).  General: Alert, oriented x3, no distress Head: no evidence of trauma, PERRL, EOMI, no exophtalmos or lid lag, no myxedema, no xanthelasma; normal ears, nose and oropharynx Neck: normal jugular venous pulsations and no hepatojugular reflux; brisk carotid pulses without delay and no carotid bruits Chest: clear to auscultation, no signs of consolidation by percussion or palpation, normal fremitus, symmetrical and full respiratory excursions Cardiovascular: normal position and quality of the apical impulse, regular rhythm, normal first and second heart sounds, no murmurs, rubs or gallops Abdomen: no tenderness or distention, no masses by palpation, no abnormal pulsatility or arterial bruits, normal bowel sounds, no hepatosplenomegaly Extremities: no clubbing, cyanosis; there is 2+ relatively soft pitting edema of the left foot, ankle and lower calf, there is no right pedal edema; 2+  radial, ulnar and brachial pulses bilaterally; 2+ right femoral, posterior tibial and dorsalis pedis pulses; 2+ left femoral, posterior tibial and dorsalis pedis pulses; no subclavian or femoral bruits Neurological: grossly nonfocal Psych: euthymic mood, full affect   EKG:  EKG is ordered today. The ekg ordered today demonstrates A paced - V paced   Wt Readings from Last 3 Encounters:  06/23/14 145 lb 11.2 oz (66.089 kg)  04/30/14 147 lb (66.679 kg)  06/09/13 143 lb (64.864 kg)      ASSESSMENT AND PLAN:  1.  Possible left lower extremity DVT - urgent duplex US ordered, but could not be done today. The edema is remarkably soft and painless and there are no pulmonary symptoms, but the asymmetry and the recent airline trip are quite concerning for DVT. Will get [place] as soon as we can.  2. Complete heart block with normal pacemaker function - continue Carelink Q34mos  3. Occasional NSVT - likely not significant in the absence of structural heart disease  4. Paroxysmal atrial fibrillation - CHADSVasc score is essentially zero (one point only for female gender). ASA for prophylaxis only. May need anticoagulation as  she ages or new pathology develops.   Current medicines are reviewed at length with the patient today.  The patient does not have concerns regarding medicines.  The following changes have been made:  no change  Labs/ tests ordered today include:  Orders Placed This Encounter  Procedures  . EKG 12-Lead  . Lower Extremity Venous Duplex   Patient Instructions  Your physician has requested that you have a lower extremity (left leg) venous duplex. This test is an ultrasound of the veins in the legs or arms. It looks at venous blood flow that carries blood from the heart to the legs or arms. Allow one hour for a Lower Venous exam. Allow thirty minutes for an Upper Venous exam. There are no restrictions or special instructions.  You have an appointment for a left leg venous doppler  Thursday 11:15am.  If we can get it tomorrow at the hospital we will call with that appointment.    Dr. Sallyanne Kuster recommends that you schedule a follow-up appointment in: One year.         Mikael Spray, MD  06/23/2014 6:02 PM    Sanda Klein, MD, [facility] [phone] office [phone] pager

## 2014-06-23 NOTE — Patient Instructions (Signed)
Your physician has requested that you have a lower extremity (left leg) venous duplex. This test is an ultrasound of the veins in the legs or arms. It looks at venous blood flow that carries blood from the heart to the legs or arms. Allow one hour for a Lower Venous exam. Allow thirty minutes for an Upper Venous exam. There are no restrictions or special instructions.  You have an appointment for a left leg venous doppler Thursday 11:15am.  If we can get it tomorrow at the hospital we will call with that appointment.    Dr. Sallyanne Kuster recommends that you schedule a follow-up appointment in: One year.       Fat and Cholesterol Control Diet Fat and cholesterol levels in your blood and organs are influenced by your diet. High levels of fat and cholesterol may lead to diseases of the heart, small and large blood vessels, gallbladder, liver, and pancreas. CONTROLLING FAT AND CHOLESTEROL WITH DIET Although exercise and lifestyle factors are important, your diet is key. That is because certain foods are known to raise cholesterol and others to lower it. The goal is to balance foods for their effect on cholesterol and more importantly, to replace saturated and trans fat with other types of fat, such as monounsaturated fat, polyunsaturated fat, and omega-3 fatty acids. On average, a person should consume no more than 15 to 17 g of saturated fat daily. Saturated and trans fats are considered "bad" fats, and they will raise LDL cholesterol. Saturated fats are primarily found in animal products such as meats, butter, and cream. However, that does not mean you need to give up all your favorite foods. Today, there are good tasting, low-fat, low-cholesterol substitutes for most of the things you like to eat. Choose low-fat or nonfat alternatives. Choose round or loin cuts of red meat. These types of cuts are lowest in fat and cholesterol. Chicken (without the skin), fish, veal, and ground [place] breast are great  choices. Eliminate fatty meats, such as hot dogs and salami. Even shellfish have little or no saturated fat. Have a 3 oz (85 g) portion when you eat lean meat, poultry, or fish. Trans fats are also called "partially hydrogenated oils." They are oils that have been scientifically manipulated so that they are solid at room temperature resulting in a longer shelf life and improved taste and texture of foods in which they are added. Trans fats are found in stick margarine, some tub margarines, cookies, crackers, and baked goods.  When baking and cooking, oils are a great substitute for butter. The monounsaturated oils are especially beneficial since it is believed they lower LDL and raise HDL. The oils you should avoid entirely are saturated tropical oils, such as coconut and palm.  Remember to eat a lot from food groups that are naturally free of saturated and trans fat, including fish, fruit, vegetables, beans, grains (barley, rice, couscous, bulgur wheat), and pasta (without cream sauces).  IDENTIFYING FOODS THAT LOWER FAT AND CHOLESTEROL  Soluble fiber may lower your cholesterol. This type of fiber is found in fruits such as apples, vegetables such as broccoli, potatoes, and carrots, legumes such as beans, peas, and lentils, and grains such as barley. Foods fortified with plant sterols (phytosterol) may also lower cholesterol. You should eat at least 2 g per day of these foods for a cholesterol lowering effect.  Read package labels to identify low-saturated fats, trans fat free, and low-fat foods at the supermarket. Select cheeses that have only 2 to  3 g saturated fat per ounce. Use a heart-healthy tub margarine that is free of trans fats or partially hydrogenated oil. When buying baked goods (cookies, crackers), avoid partially hydrogenated oils. Breads and muffins should be made from whole grains (whole-wheat or whole oat flour, instead of "flour" or "enriched flour"). Buy non-creamy canned soups with  reduced salt and no added fats.  FOOD PREPARATION TECHNIQUES  Never deep-fry. If you must fry, either stir-fry, which uses very little fat, or use non-stick cooking sprays. When possible, broil, bake, or roast meats, and steam vegetables. Instead of putting butter or margarine on vegetables, use lemon and herbs, applesauce, and cinnamon (for squash and sweet potatoes). Use nonfat yogurt, salsa, and low-fat dressings for salads.  LOW-SATURATED FAT / LOW-FAT FOOD SUBSTITUTES Meats / Saturated Fat (g)  Avoid: Steak, marbled (3 oz/85 g) / 11 g  Choose: Steak, lean (3 oz/85 g) / 4 g  Avoid: Hamburger (3 oz/85 g) / 7 g  Choose: Hamburger, lean (3 oz/85 g) / 5 g  Avoid: Ham (3 oz/85 g) / 6 g  Choose: Ham, lean cut (3 oz/85 g) / 2.4 g  Avoid: Chicken, with skin, dark meat (3 oz/85 g) / 4 g  Choose: Chicken, skin removed, dark meat (3 oz/85 g) / 2 g  Avoid: Chicken, with skin, light meat (3 oz/85 g) / 2.5 g  Choose: Chicken, skin removed, light meat (3 oz/85 g) / 1 g Dairy / Saturated Fat (g)  Avoid: Whole milk (1 cup) / 5 g  Choose: Low-fat milk, 2% (1 cup) / 3 g  Choose: Low-fat milk, 1% (1 cup) / 1.5 g  Choose: Skim milk (1 cup) / 0.3 g  Avoid: Hard cheese (1 oz/28 g) / 6 g  Choose: Skim milk cheese (1 oz/28 g) / 2 to 3 g  Avoid: Cottage cheese, 4% fat (1 cup) / 6.5 g  Choose: Low-fat cottage cheese, 1% fat (1 cup) / 1.5 g  Avoid: Ice cream (1 cup) / 9 g  Choose: Sherbet (1 cup) / 2.5 g  Choose: Nonfat frozen yogurt (1 cup) / 0.3 g  Choose: Frozen fruit bar / trace  Avoid: Whipped cream (1 tbs) / 3.5 g  Choose: Nondairy whipped topping (1 tbs) / 1 g Condiments / Saturated Fat (g)  Avoid: Mayonnaise (1 tbs) / 2 g  Choose: Low-fat mayonnaise (1 tbs) / 1 g  Avoid: Butter (1 tbs) / 7 g  Choose: Extra light margarine (1 tbs) / 1 g  Avoid: Coconut oil (1 tbs) / 11.8 g  Choose: Olive oil (1 tbs) / 1.8 g  Choose: Corn oil (1 tbs) / 1.7 g  Choose: Safflower oil  (1 tbs) / 1.2 g  Choose: Sunflower oil (1 tbs) / 1.4 g  Choose: Soybean oil (1 tbs) / 2.4 g  Choose: Canola oil (1 tbs) / 1 g Document Released: 03/20/2005 Document Revised: 07/15/2012 Document Reviewed: 06/18/2013 Exit[employer] Patient Information 2015 [place], [place]. This information is not intended to replace advice given to you by your health care provider. Make sure you discuss any questions you have with your health care provider.

## 2014-06-24 LAB — MDC_IDC_ENUM_SESS_TYPE_REMOTE
Battery Remaining Longevity: 74 mo
Brady Statistic AS VP Percent: 16 %
Brady Statistic AS VS Percent: 0 %
Lead Channel Impedance Value: 578 Ohm
Lead Channel Pacing Threshold Amplitude: 0.5 V
Lead Channel Pacing Threshold Pulse Width: 0.4 ms
Lead Channel Setting Pacing Amplitude: 2 V
Lead Channel Setting Pacing Amplitude: 2.5 V
Lead Channel Setting Pacing Pulse Width: 0.4 ms
MDC IDC MSMT BATTERY IMPEDANCE: 464 Ohm
MDC IDC MSMT BATTERY VOLTAGE: 2.79 V
MDC IDC MSMT LEADCHNL RA IMPEDANCE VALUE: 440 Ohm
MDC IDC MSMT LEADCHNL RV PACING THRESHOLD AMPLITUDE: 0.5 V
MDC IDC MSMT LEADCHNL RV PACING THRESHOLD PULSEWIDTH: 0.4 ms
MDC IDC SESS DTM: 20160322193201
MDC IDC SET LEADCHNL RV SENSING SENSITIVITY: 2.8 mV
MDC IDC STAT BRADY AP VP PERCENT: 84 %
MDC IDC STAT BRADY AP VS PERCENT: 0 %

## 2014-06-25 ENCOUNTER — Ambulatory Visit (HOSPITAL_COMMUNITY)
Admission: RE | Admit: 2014-06-25 | Discharge: 2014-06-25 | Disposition: A | Discharge status: Home / Self Care | Payer: BC Managed Care – PPO | Source: Ambulatory Visit | Attending: Cardiology | Admitting: Cardiology

## 2014-06-25 DIAGNOSIS — M79662 Pain in left lower leg: Secondary | ICD-10-CM | POA: Diagnosis present

## 2014-06-25 NOTE — Progress Notes (Signed)
Left Lower Ext. Venous Duplex Completed. Negative for DVT or SVT. Vick Filter, BS, RDMS, RVT  

## 2014-06-29 ENCOUNTER — Telehealth: Payer: Self-pay | Admitting: *Deleted

## 2014-06-29 NOTE — Telephone Encounter (Signed)
LE doppler results called to patient - no DVT.

## 2014-07-06 ENCOUNTER — Other Ambulatory Visit: Payer: Self-pay | Admitting: Cardiovascular Disease

## 2014-07-06 NOTE — Telephone Encounter (Signed)
Rx has been sent to the pharmacy electronically. ° °

## 2014-07-14 LAB — HM COLONOSCOPY

## 2014-07-17 ENCOUNTER — Encounter: Payer: Self-pay | Admitting: Cardiovascular Disease

## 2014-08-10 ENCOUNTER — Other Ambulatory Visit (HOSPITAL_COMMUNITY): Payer: Self-pay | Admitting: Obstetrics and Gynecology

## 2014-08-12 LAB — CYTOLOGY - PAP

## 2014-08-20 ENCOUNTER — Ambulatory Visit (INDEPENDENT_AMBULATORY_CARE_PROVIDER_SITE_OTHER): Payer: BC Managed Care – PPO | Admitting: Internal Medicine

## 2014-08-20 ENCOUNTER — Encounter: Payer: Self-pay | Admitting: Internal Medicine

## 2014-08-20 VITALS — BP 114/72 | HR 84 | Ht 67.0 in | Wt 143.0 lb

## 2014-08-20 DIAGNOSIS — J4520 Mild intermittent asthma, uncomplicated: Secondary | ICD-10-CM | POA: Diagnosis not present

## 2014-08-20 DIAGNOSIS — J3800 Paralysis of vocal cords and larynx, unspecified: Secondary | ICD-10-CM

## 2014-08-20 DIAGNOSIS — J209 Acute bronchitis, unspecified: Secondary | ICD-10-CM

## 2014-08-20 MED ORDER — ALBUTEROL SULFATE HFA 108 (90 BASE) MCG/ACT IN AERS
2.0000 | INHALATION_SPRAY | Freq: Four times a day (QID) | RESPIRATORY_TRACT | Status: DC | PRN
Start: 2014-08-20 — End: 2015-08-20

## 2014-08-20 MED ORDER — BUDESONIDE-FORMOTEROL FUMARATE 160-4.5 MCG/ACT IN AERO: 2.0000 | INHALATION_SPRAY | Freq: Two times a day (BID) | RESPIRATORY_TRACT | Status: DC

## 2014-08-20 MED ORDER — FLUTICASONE PROPIONATE 50 MCG/ACT NA SUSP: NASAL | Status: DC

## 2014-08-20 NOTE — Patient Instructions (Signed)
Refill scripts sent for Symbicort and Flonase/ fluticasone  Please call as needed. I hope this is another really good year !

## 2014-08-20 NOTE — Progress Notes (Signed)
Patient ID: Brandy [name], female    DOB: [birth year], 59 y.o.   MRN: [id number]  HPI 11/24/10-female never smoker followed for allergic rhinitis, asthmatic bronchitis, complicated by GERD, vocal cord paresis/ splint, complete heart block/atrial fibrillation/pacemaker Last here May 19, 2010. Has had an appendectomy since last year. 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 New HVAC system being installed, so construction issues now, but she expects this to be eventually an improvement at her school building..  She had done well but is now starting to notice some head congestion and cough. She continues Symbicort, rescue HFA, and she likes tesalon for cough.. She feels these meds, with generic zyrtec are sufficient.   06/12/11- female never smoker followed for allergic rhinitis, asthmatic bronchitis, complicated by GERD, vocal cord paresis/ splint, complete heart block/atrial fibrillation/pacemaker   PCP Dr Tamsen Roers Winter with little laryngitis and no bad colds. Doing well now. Occasional panic attacks related to stress at work. I had diagnosed depression years ago and she has taken Prozac. She asks to try lorazepam.  12/21/11- 59  female never smoker followed for allergic rhinitis, asthmatic bronchitis, complicated by GERD, Hx vocal cord paresis/ splint, complete heart block/atrial fibrillation/pacemaker    PCP Dr Tamsen Roers Doing okay at this time; had flare ups with weather changes. Gets flu vaccine at school. Carpet removed from classroom floors now - she thinks that helps a lot. Frequent exposure to colds for her school. In the last few days she has had increased postnasal drip, cough, hoarseness, moving into her chest. Husband has had a bad cold.  12/09/12- 59  female never smoker followed for allergic rhinitis, asthmatic bronchitis, complicated by GERD, Hx vocal cord paresis/ splint, complete heart block/atrial fibrillation/pacemaker FOLLOWS FOR: having a flare up for past 10 days-losing  voice, cough, PND. Acute illness, 10 days, laryngitis x5 days. She thinks this is allergy, not a cold. No chest tightness. Some sore throat with postnasal drip.  08/20/14- 59  female never smoker followed for allergic rhinitis, asthmatic bronchitis, complicated by GERD, Hx vocal cord paresis/ splint, complete heart block/atrial fibrillation/pacemaker Follows: PT. IS DOING WELL,  Retired- less exposure to coughs and colds. Minor pollen rhinitis this spring. No change in chronic vocal cord splints, or pacemaker.   Review of Systems-see HPI Constitutional:   No-   weight loss, night sweats, fevers, chills, fatigue, lassitude. HEENT:   No-  headaches, difficulty swallowing, tooth/dental problems, sore throat,       No-  sneezing, itching, ear ache,+ mild-nasal congestion, +post nasal drip,  CV:  No-   chest pain, orthopnea, PND, swelling in lower extremities, anasarca, dizziness, palpitations Resp: No-   shortness of breath with exertion or at rest.              No-   productive cough,  no- non-productive cough,  No-  coughing up of blood.              No-   change in color of mucus.  No- wheezing.   Skin: No-   rash or lesions. GI:  No-   heartburn, indigestion, abdominal pain, nausea, vomiting,  GU:  MS:  No-   joint pain or swelling.   Neuro- nothing unusual  Psych:  No- change in mood or affect. No depression or anxiety.  No memory loss.    Objective:   Physical Exam General- Alert, Oriented, Affect-appropriate, Distress- none acute Skin- rash-none, lesions- none, excoriation- none Lymphadenopathy- none Head- atraumatic  Eyes- Gross vision intact, PERRLA, conjunctivae clear secretions            Ears- Hearing, canals normal            Nose- +sniffing, No Septal dev, mucus, polyps, erosion, perforation             Throat- Mallampati II , mucosa -clear, not red , drainage- none, tonsils- atrophic;  +voice quality hoarse Neck- flexible , trachea midline, no stridor , thyroid  nl, carotid no bruit Chest - symmetrical excursion , unlabored           Heart/CV- RRR , no murmur , no gallop  , no rub, nl s1 s2                           - JVD- none , edema- none, stasis changes- none, varices- none           Lung- clear to P&A, wheeze- none, cough- none , dullness-none, rub- none           Chest wall- +L pacemaker Abd-  Br/ Gen/ Rectal- Not done, not indicated Extrem- cyanosis- none, clubbing, none, atrophy- none, strength- nl Neuro- grossly intact to observation

## 2014-09-07 NOTE — Assessment & Plan Note (Signed)
Well controled this year so far. Plan- continue Symbicort.

## 2014-09-07 NOTE — Assessment & Plan Note (Signed)
We discussed risk of LPR/ aspiration, but she is doing well.

## 2014-09-23 ENCOUNTER — Ambulatory Visit (INDEPENDENT_AMBULATORY_CARE_PROVIDER_SITE_OTHER): Payer: BC Managed Care – PPO | Admitting: *Deleted

## 2014-09-23 ENCOUNTER — Encounter: Payer: Self-pay | Admitting: Cardiovascular Disease

## 2014-09-23 DIAGNOSIS — I442 Atrioventricular block, complete: Secondary | ICD-10-CM

## 2014-09-24 NOTE — Progress Notes (Signed)
Remote pacemaker transmission.   

## 2014-09-27 LAB — CUP PACEART REMOTE DEVICE CHECK
Battery Impedance: 513 Ohm
Battery Remaining Longevity: 71 mo
Brady Statistic AP VS Percent: 0 %
Brady Statistic AS VP Percent: 16 %
Brady Statistic AS VS Percent: 0 %
Date Time Interrogation Session: 20160622124958
Lead Channel Pacing Threshold Amplitude: 0.5 V
Lead Channel Pacing Threshold Amplitude: 0.5 V
Lead Channel Pacing Threshold Pulse Width: 0.4 ms
Lead Channel Setting Pacing Amplitude: 2.5 V
Lead Channel Setting Pacing Pulse Width: 0.4 ms
Lead Channel Setting Sensing Sensitivity: 2.8 mV
MDC IDC MSMT BATTERY VOLTAGE: 2.8 V
MDC IDC MSMT LEADCHNL RA IMPEDANCE VALUE: 447 Ohm
MDC IDC MSMT LEADCHNL RV IMPEDANCE VALUE: 552 Ohm
MDC IDC MSMT LEADCHNL RV PACING THRESHOLD PULSEWIDTH: 0.4 ms
MDC IDC SET LEADCHNL RA PACING AMPLITUDE: 2 V
MDC IDC STAT BRADY AP VP PERCENT: 84 %

## 2014-10-01 ENCOUNTER — Encounter: Payer: Self-pay | Admitting: Cardiology

## 2014-10-29 ENCOUNTER — Ambulatory Visit (INDEPENDENT_AMBULATORY_CARE_PROVIDER_SITE_OTHER): Payer: BC Managed Care – PPO

## 2014-10-29 DIAGNOSIS — Z23 Encounter for immunization: Secondary | ICD-10-CM

## 2014-10-29 NOTE — Progress Notes (Signed)
   Subjective:    Patient ID: Brandy [name], female    DOB: April 27, 1955, 59 y.o.   MRN: [id number]  HPI Patient here for 3rd Hep B and 2nd Hep A   Review of Systems     Objective:   Physical Exam        Assessment & Plan:  Patient received 3rd Hep B shot, and 2nd Hep A. Series complete.

## 2014-11-06 ENCOUNTER — Telehealth: Payer: Self-pay | Admitting: Cardiovascular Disease

## 2014-11-06 NOTE — Telephone Encounter (Signed)
New message     Pt calling about home monitor Pt asked to speak with Tobin Chad Please call to discuss

## 2014-11-09 NOTE — Telephone Encounter (Signed)
LM w/spouse for pt to call back.sss

## 2014-11-10 NOTE — Telephone Encounter (Signed)
Follow.up ° ° ° ° ° °Returning Shakila's call °

## 2014-11-10 NOTE — Telephone Encounter (Signed)
Patient states that she will be out of town on the day that her remote was scheduled, so the appt was moved to 10-3. Patient voiced understanding.  Patient also asked about the CL upgrade. She asked which option Dr.Croitoru preferred. I explained to her that it did not matter bc the information is received the same way. [place] ordered. Patient voiced understanding.

## 2014-12-04 ENCOUNTER — Other Ambulatory Visit: Payer: Self-pay | Admitting: Orthopedic Surgery

## 2014-12-08 ENCOUNTER — Ambulatory Visit (HOSPITAL_BASED_OUTPATIENT_CLINIC_OR_DEPARTMENT_OTHER)
Admission: RE | Admit: 2014-12-08 | Discharge: 2014-12-08 | Disposition: A | Discharge status: Home / Self Care | Payer: BC Managed Care – PPO | Source: Ambulatory Visit | Attending: Orthopedic Surgery | Admitting: Orthopedic Surgery

## 2014-12-08 ENCOUNTER — Encounter (HOSPITAL_BASED_OUTPATIENT_CLINIC_OR_DEPARTMENT_OTHER): Payer: Self-pay | Admitting: *Deleted

## 2014-12-08 ENCOUNTER — Encounter (HOSPITAL_BASED_OUTPATIENT_CLINIC_OR_DEPARTMENT_OTHER)
Admission: RE | Disposition: A | Discharge status: Home / Self Care | Payer: Self-pay | Source: Ambulatory Visit | Attending: Orthopedic Surgery

## 2014-12-08 ENCOUNTER — Ambulatory Visit (HOSPITAL_BASED_OUTPATIENT_CLINIC_OR_DEPARTMENT_OTHER): Payer: BC Managed Care – PPO | Admitting: Anesthesiology

## 2014-12-08 DIAGNOSIS — S42002A Fracture of unspecified part of left clavicle, initial encounter for closed fracture: Secondary | ICD-10-CM | POA: Insufficient documentation

## 2014-12-08 DIAGNOSIS — Z538 Procedure and treatment not carried out for other reasons: Secondary | ICD-10-CM | POA: Diagnosis not present

## 2014-12-08 DIAGNOSIS — Z8614 Personal history of Methicillin resistant Staphylococcus aureus infection: Secondary | ICD-10-CM | POA: Diagnosis not present

## 2014-12-08 DIAGNOSIS — J45909 Unspecified asthma, uncomplicated: Secondary | ICD-10-CM | POA: Insufficient documentation

## 2014-12-08 DIAGNOSIS — Z950 Presence of cardiac pacemaker: Secondary | ICD-10-CM | POA: Insufficient documentation

## 2014-12-08 DIAGNOSIS — Z79899 Other long term (current) drug therapy: Secondary | ICD-10-CM | POA: Insufficient documentation

## 2014-12-08 DIAGNOSIS — K219 Gastro-esophageal reflux disease without esophagitis: Secondary | ICD-10-CM | POA: Insufficient documentation

## 2014-12-08 DIAGNOSIS — Z881 Allergy status to other antibiotic agents status: Secondary | ICD-10-CM | POA: Diagnosis not present

## 2014-12-08 HISTORY — DX: Methicillin resistant Staphylococcus aureus infection, unspecified site: A49.02

## 2014-12-08 LAB — POCT I-STAT, CHEM 8
BUN: 17 mg/dL (ref 6–20)
CALCIUM ION: 1.2 mmol/L (ref 1.13–1.30)
CREATININE: 1 mg/dL (ref 0.44–1.00)
Chloride: 104 mmol/L (ref 101–111)
GLUCOSE: 92 mg/dL (ref 65–99)
HEMATOCRIT: 41 % (ref 36.0–46.0)
Hemoglobin: 13.9 g/dL (ref 12.0–15.0)
Potassium: 3.8 mmol/L (ref 3.5–5.1)
Sodium: 140 mmol/L (ref 135–145)
TCO2: 26 mmol/L (ref 0–100)

## 2014-12-08 SURGERY — CANCELLED PROCEDURE
Anesthesia: General

## 2014-12-08 MED ORDER — CEFAZOLIN SODIUM-DEXTROSE 2-3 GM-% IV SOLR: 2.0000 g | INTRAVENOUS | Status: DC

## 2014-12-08 MED ORDER — LACTATED RINGERS IV SOLN
INTRAVENOUS | Status: DC
  Administered 2014-12-08: 13:00:00 via INTRAVENOUS

## 2014-12-08 MED ORDER — CEFAZOLIN SODIUM-DEXTROSE 2-3 GM-% IV SOLR
INTRAVENOUS | Status: AC
  Filled 2014-12-08: qty 50

## 2014-12-08 MED ORDER — FENTANYL CITRATE (PF) 100 MCG/2ML IJ SOLN
INTRAMUSCULAR | Status: AC
  Filled 2014-12-08: qty 2

## 2014-12-08 MED ORDER — SCOPOLAMINE 1 MG/3DAYS TD PT72: 1.0000 | MEDICATED_PATCH | Freq: Once | TRANSDERMAL | Status: DC | PRN

## 2014-12-08 MED ORDER — MIDAZOLAM HCL 2 MG/2ML IJ SOLN: 1.0000 mg | INTRAMUSCULAR | Status: DC | PRN

## 2014-12-08 MED ORDER — FENTANYL CITRATE (PF) 100 MCG/2ML IJ SOLN: 50.0000 ug | INTRAMUSCULAR | Status: DC | PRN

## 2014-12-08 MED ORDER — LIDOCAINE HCL (CARDIAC) 20 MG/ML IV SOLN
INTRAVENOUS | Status: AC
  Filled 2014-12-08: qty 5

## 2014-12-08 MED ORDER — GLYCOPYRROLATE 0.2 MG/ML IJ SOLN
0.2000 mg | Freq: Once | INTRAMUSCULAR | Status: DC | PRN
Start: 2014-12-08 — End: 2014-12-08

## 2014-12-08 MED ORDER — MIDAZOLAM HCL 2 MG/2ML IJ SOLN
INTRAMUSCULAR | Status: AC
  Filled 2014-12-08: qty 2

## 2014-12-08 MED ORDER — POVIDONE-IODINE 7.5 % EX SOLN: Freq: Once | CUTANEOUS | Status: DC

## 2014-12-08 NOTE — Anesthesia Preprocedure Evaluation (Signed)
Anesthesia Evaluation  Patient identified by MRN, date of birth, ID band Patient awake    Reviewed: Allergy & Precautions, NPO status , Patient's Chart, lab work & pertinent test results  Airway Mallampati: II  TM Distance: >3 FB Neck ROM: Full    Dental no notable dental hx.    Pulmonary asthma ,  breath sounds clear to auscultation  Pulmonary exam normal       Cardiovascular Normal cardiovascular exam+ dysrhythmias + pacemaker Rhythm:Regular Rate:Normal     Neuro/Psych negative neurological ROS  negative psych ROS   GI/Hepatic negative GI ROS, Neg liver ROS,   Endo/Other  negative endocrine ROS  Renal/GU negative Renal ROS  negative genitourinary   Musculoskeletal negative musculoskeletal ROS (+)   Abdominal   Peds negative pediatric ROS (+)  Hematology negative hematology ROS (+)   Anesthesia Other Findings   Reproductive/Obstetrics negative OB ROS                             Anesthesia Physical Anesthesia Plan  ASA: III  Anesthesia Plan: General   Post-op Pain Management: GA combined w/ Regional for post-op pain   Induction: Intravenous  Airway Management Planned: Oral ETT  Additional Equipment:   Intra-op Plan:   Post-operative Plan: Extubation in OR  Informed Consent: I have reviewed the patients History and Physical, chart, labs and discussed the procedure including the risks, benefits and alternatives for the proposed anesthesia with the patient or authorized representative who has indicated his/her understanding and acceptance.   Dental advisory given  Plan Discussed with: CRNA and Surgeon  Anesthesia Plan Comments: (Use 6.5 ETT)        Anesthesia Quick Evaluation

## 2014-12-08 NOTE — Progress Notes (Signed)
Medtronic rep is in a procedure, and unable to check patient's pacemaker until 1430 at earliest. Dr Tamera Punt spoke with patient and case to be rescheduled.

## 2014-12-08 NOTE — Progress Notes (Addendum)
Perioperative prescription for implanted cardiac device programming sent to cardiologist at 1222. Awaiting response prior to surgery. Dr Kalman Shan aware.

## 2014-12-08 NOTE — H&P (Signed)
Brandy [name] is an 59 y.o. female.   Chief Complaint: L distal clavicle fracture HPI: s/p fall with L displaced distal clavicle fracture.  Past Medical History  Diagnosis Date  . Asthmatic bronchitis   . Allergic rhinitis   . Vaginitis   . Vocal cord paresis   . GERD (gastroesophageal reflux disease)   . Complete heart block   . Presence of permanent cardiac pacemaker 08-05-08    Medtronic Adapta  . Nonsustained ventricular tachycardia   . MRSA (methicillin resistant Staphylococcus aureus) 2013    left buttock    Past Surgical History  Procedure Laterality Date  . Vocal cord splint  2005  . Dilation and curettage of uterus  1978  . Hand surgery    . Pacemaker insertion  08/05/08    Medtronic Adapta  . Appendectomy  05-2010    Family History  Problem Relation Age of Onset  . Hypertension Mother   . Coronary artery disease Mother   . Diabetes Father   . Heart disease Father   . Hypertension Sister    Social History:  reports that she has never smoked. She does not have any smokeless tobacco history on file. She reports that she drinks alcohol. Her drug history is not on file.  Allergies:  Allergies  Allergen Reactions  . Erythromycin   . Sulfa Antibiotics Rash    Urticarial rash developed 1 day after completing a 14 day course of TMP/SMX    Medications Prior to Admission  Medication Sig Dispense Refill  . albuterol (PROAIR HFA) 108 (90 BASE) MCG/ACT inhaler Inhale 2 puffs into the lungs every 6 (six) hours as needed for wheezing or shortness of breath. 1 Inhaler prn  . ALPRAZolam (XANAX) 0.25 MG tablet Take 0.25 mg by mouth at bedtime as needed.    Marland Kitchen aspirin 81 MG tablet Take 81 mg by mouth daily.    . budesonide-formoterol (SYMBICORT) 160-4.5 MCG/ACT inhaler Inhale 2 puffs into the lungs 2 (two) times daily. Rinse mouth 1 Inhaler prn  . Calcium Carbonate-Vitamin D (CALCIUM 500 + D) 500-125 MG-UNIT TABS Take 1 tablet by mouth daily.      . cetirizine (ZYRTEC) 10 MG  tablet Take 10 mg by mouth daily.      Marland Kitchen FLUoxetine (PROZAC) 10 MG capsule     . FLUoxetine (PROZAC) 10 MG tablet Take 10 mg by mouth daily.      Marland Kitchen FLUoxetine (PROZAC) 20 MG capsule     . fluticasone (FLONASE) 50 MCG/ACT nasal spray USE 1 SPRAY IN EACH NOSTRIL DAILY 1 g prn  . metoprolol succinate (TOPROL-XL) 25 MG 24 hr tablet TAKE 1 TABLET BY MOUTH DAILY 30 tablet 9  . oxyCODONE-acetaminophen (PERCOCET/ROXICET) 5-325 MG per tablet Take by mouth every 4 (four) hours as needed for severe pain.    . benzonatate (TESSALON) 200 MG capsule TAKE 1 CAPSULE BY MOUTH THREE TIMES DAILY AS NEEDED 30 capsule 5    Results for orders placed or performed during the hospital encounter of 12/08/14 (from the past 48 hour(s))  I-STAT, chem 8     Status: None   Collection Time: 12/08/14 12:49 PM  Result Value Ref Range   Sodium 140 135 - 145 mmol/L   Potassium 3.8 3.5 - 5.1 mmol/L   Chloride 104 101 - 111 mmol/L   BUN 17 6 - 20 mg/dL   Creatinine, Ser 1.00 0.44 - 1.00 mg/dL   Glucose, Bld 92 65 - 99 mg/dL   Calcium, Ion 1.20  1.13 - 1.30 mmol/L   TCO2 26 0 - 100 mmol/L   Hemoglobin 13.9 12.0 - 15.0 g/dL   HCT 41.0 36.0 - 46.0 %   No results found.  Review of Systems  All other systems reviewed and are negative.   Blood pressure 133/91, pulse 72, temperature 98 F (36.7 C), temperature source Oral, resp. rate 18, height 5\' 7"  (1.702 m), weight 64.411 kg (142 lb), SpO2 100 %. Physical Exam  Constitutional: She is oriented to person, place, and time. She appears well-developed and well-nourished.  HENT:  Head: Atraumatic.  Eyes: EOM are normal.  Cardiovascular: Intact distal pulses.   Respiratory: Effort normal.  Musculoskeletal:  L shoulder prominent distal clavicle TTP. Skin intact.  NVID.  Neurological: She is alert and oriented to person, place, and time.  Skin: Skin is warm and dry.  Psychiatric: She has a normal mood and affect.     Assessment/Plan L displaced distal clavicle fx Plan  ORIF with CC lig fixation Risks / benefits of surgery discussed Consent on chart  NPO for OR Preop antibiotics   [name] WILLIAM 12/08/2014, 1:08 PM

## 2014-12-09 ENCOUNTER — Telehealth: Payer: Self-pay | Admitting: *Deleted

## 2014-12-09 NOTE — Telephone Encounter (Signed)
Perioperative prescription for implanted cardiac device programming for left shoulder surgery.  Pacemaker dependent due to CHB.  Asynchronous pacing during procedure faxed to [facility] Pre-surgery.

## 2014-12-10 ENCOUNTER — Ambulatory Visit (HOSPITAL_BASED_OUTPATIENT_CLINIC_OR_DEPARTMENT_OTHER): Payer: BC Managed Care – PPO | Admitting: Certified Registered"

## 2014-12-10 ENCOUNTER — Ambulatory Visit (HOSPITAL_BASED_OUTPATIENT_CLINIC_OR_DEPARTMENT_OTHER)
Admission: RE | Admit: 2014-12-10 | Discharge: 2014-12-11 | Disposition: A | Discharge status: Home / Self Care | Payer: BC Managed Care – PPO | Source: Ambulatory Visit | Attending: Orthopedic Surgery | Admitting: Orthopedic Surgery

## 2014-12-10 ENCOUNTER — Encounter (HOSPITAL_BASED_OUTPATIENT_CLINIC_OR_DEPARTMENT_OTHER)
Admission: RE | Disposition: A | Discharge status: Home / Self Care | Payer: Self-pay | Source: Ambulatory Visit | Attending: Orthopedic Surgery

## 2014-12-10 ENCOUNTER — Ambulatory Visit (HOSPITAL_COMMUNITY): Payer: BC Managed Care – PPO

## 2014-12-10 ENCOUNTER — Encounter (HOSPITAL_BASED_OUTPATIENT_CLINIC_OR_DEPARTMENT_OTHER): Payer: Self-pay | Admitting: *Deleted

## 2014-12-10 DIAGNOSIS — Z8614 Personal history of Methicillin resistant Staphylococcus aureus infection: Secondary | ICD-10-CM | POA: Diagnosis not present

## 2014-12-10 DIAGNOSIS — I442 Atrioventricular block, complete: Secondary | ICD-10-CM | POA: Diagnosis not present

## 2014-12-10 DIAGNOSIS — Y998 Other external cause status: Secondary | ICD-10-CM | POA: Diagnosis not present

## 2014-12-10 DIAGNOSIS — Z7982 Long term (current) use of aspirin: Secondary | ICD-10-CM | POA: Insufficient documentation

## 2014-12-10 DIAGNOSIS — Z882 Allergy status to sulfonamides status: Secondary | ICD-10-CM | POA: Diagnosis not present

## 2014-12-10 DIAGNOSIS — M8588 Other specified disorders of bone density and structure, other site: Secondary | ICD-10-CM | POA: Insufficient documentation

## 2014-12-10 DIAGNOSIS — I472 Ventricular tachycardia: Secondary | ICD-10-CM | POA: Insufficient documentation

## 2014-12-10 DIAGNOSIS — J3800 Paralysis of vocal cords and larynx, unspecified: Secondary | ICD-10-CM | POA: Insufficient documentation

## 2014-12-10 DIAGNOSIS — Y9289 Other specified places as the place of occurrence of the external cause: Secondary | ICD-10-CM | POA: Diagnosis not present

## 2014-12-10 DIAGNOSIS — W010XXA Fall on same level from slipping, tripping and stumbling without subsequent striking against object, initial encounter: Secondary | ICD-10-CM | POA: Diagnosis not present

## 2014-12-10 DIAGNOSIS — Z881 Allergy status to other antibiotic agents status: Secondary | ICD-10-CM | POA: Insufficient documentation

## 2014-12-10 DIAGNOSIS — Z419 Encounter for procedure for purposes other than remedying health state, unspecified: Secondary | ICD-10-CM

## 2014-12-10 DIAGNOSIS — Y9389 Activity, other specified: Secondary | ICD-10-CM | POA: Diagnosis not present

## 2014-12-10 DIAGNOSIS — K219 Gastro-esophageal reflux disease without esophagitis: Secondary | ICD-10-CM | POA: Insufficient documentation

## 2014-12-10 DIAGNOSIS — Z950 Presence of cardiac pacemaker: Secondary | ICD-10-CM | POA: Diagnosis not present

## 2014-12-10 DIAGNOSIS — S42033A Displaced fracture of lateral end of unspecified clavicle, initial encounter for closed fracture: Secondary | ICD-10-CM | POA: Diagnosis present

## 2014-12-10 DIAGNOSIS — S42002A Fracture of unspecified part of left clavicle, initial encounter for closed fracture: Secondary | ICD-10-CM | POA: Diagnosis not present

## 2014-12-10 DIAGNOSIS — J45909 Unspecified asthma, uncomplicated: Secondary | ICD-10-CM | POA: Insufficient documentation

## 2014-12-10 HISTORY — PX: ACROMIO-CLAVICULAR JOINT REPAIR: SHX5183

## 2014-12-10 SURGERY — REPAIR, ACROMIOCLAVICULAR JOINT
Anesthesia: Regional | Site: Shoulder | Laterality: Left

## 2014-12-10 MED ORDER — MIDAZOLAM HCL 2 MG/2ML IJ SOLN
INTRAMUSCULAR | Status: AC
  Filled 2014-12-10: qty 2

## 2014-12-10 MED ORDER — METHOCARBAMOL 500 MG PO TABS
500.0000 mg | ORAL_TABLET | Freq: Four times a day (QID) | ORAL | Status: DC | PRN
  Administered 2014-12-10 – 2014-12-11 (×2): 500 mg via ORAL
  Filled 2014-12-10 (×2): qty 1

## 2014-12-10 MED ORDER — ACETAMINOPHEN 325 MG PO TABS: 650.0000 mg | ORAL_TABLET | Freq: Four times a day (QID) | ORAL | Status: DC | PRN

## 2014-12-10 MED ORDER — HYDROMORPHONE HCL 1 MG/ML IJ SOLN: 0.2500 mg | INTRAMUSCULAR | Status: DC | PRN

## 2014-12-10 MED ORDER — OXYCODONE HCL 5 MG PO TABS
5.0000 mg | ORAL_TABLET | ORAL | Status: DC | PRN
  Administered 2014-12-10: 5 mg via ORAL
  Administered 2014-12-11 (×2): 10 mg via ORAL
  Filled 2014-12-10: qty 2
  Filled 2014-12-10: qty 1
  Filled 2014-12-10: qty 2

## 2014-12-10 MED ORDER — DEXAMETHASONE SODIUM PHOSPHATE 4 MG/ML IJ SOLN
INTRAMUSCULAR | Status: DC | PRN
  Administered 2014-12-10: 8 mg via INTRAVENOUS

## 2014-12-10 MED ORDER — PROPOFOL 500 MG/50ML IV EMUL
INTRAVENOUS | Status: AC
  Filled 2014-12-10: qty 50

## 2014-12-10 MED ORDER — ACETAMINOPHEN 650 MG RE SUPP: 650.0000 mg | Freq: Four times a day (QID) | RECTAL | Status: DC | PRN

## 2014-12-10 MED ORDER — DEXAMETHASONE SODIUM PHOSPHATE 10 MG/ML IJ SOLN
INTRAMUSCULAR | Status: AC
  Filled 2014-12-10: qty 1

## 2014-12-10 MED ORDER — BENZONATATE 100 MG PO CAPS: 200.0000 mg | ORAL_CAPSULE | Freq: Three times a day (TID) | ORAL | Status: DC | PRN

## 2014-12-10 MED ORDER — FLUOXETINE HCL 10 MG PO CAPS: 10.0000 mg | ORAL_CAPSULE | Freq: Every day | ORAL | Status: DC

## 2014-12-10 MED ORDER — LACTATED RINGERS IV SOLN
INTRAVENOUS | Status: DC
Start: 2014-12-10 — End: 2014-12-10
  Administered 2014-12-10 (×3): via INTRAVENOUS

## 2014-12-10 MED ORDER — LIDOCAINE HCL (CARDIAC) 20 MG/ML IV SOLN
INTRAVENOUS | Status: AC
  Filled 2014-12-10: qty 5

## 2014-12-10 MED ORDER — PROMETHAZINE HCL 25 MG/ML IJ SOLN: 6.2500 mg | INTRAMUSCULAR | Status: DC | PRN

## 2014-12-10 MED ORDER — ONDANSETRON HCL 4 MG/2ML IJ SOLN
INTRAMUSCULAR | Status: DC | PRN
  Administered 2014-12-10: 4 mg via INTRAVENOUS

## 2014-12-10 MED ORDER — METOPROLOL SUCCINATE ER 25 MG PO TB24: 25.0000 mg | ORAL_TABLET | Freq: Every day | ORAL | Status: DC

## 2014-12-10 MED ORDER — OXYCODONE-ACETAMINOPHEN 5-325 MG PO TABS: 1.0000 | ORAL_TABLET | ORAL | Status: DC | PRN

## 2014-12-10 MED ORDER — PHENYLEPHRINE HCL 10 MG/ML IJ SOLN
INTRAMUSCULAR | Status: AC
  Filled 2014-12-10: qty 1

## 2014-12-10 MED ORDER — MIDAZOLAM HCL 2 MG/2ML IJ SOLN
1.0000 mg | INTRAMUSCULAR | Status: DC | PRN
  Administered 2014-12-10: 2 mg via INTRAVENOUS

## 2014-12-10 MED ORDER — SUCCINYLCHOLINE CHLORIDE 20 MG/ML IJ SOLN
INTRAMUSCULAR | Status: AC
  Filled 2014-12-10: qty 1

## 2014-12-10 MED ORDER — CEFAZOLIN SODIUM-DEXTROSE 2-3 GM-% IV SOLR
INTRAVENOUS | Status: DC | PRN
  Administered 2014-12-10: 2 g via INTRAVENOUS

## 2014-12-10 MED ORDER — ATROPINE SULFATE 0.4 MG/ML IJ SOLN
INTRAMUSCULAR | Status: AC
  Filled 2014-12-10: qty 1

## 2014-12-10 MED ORDER — METOCLOPRAMIDE HCL 5 MG/ML IJ SOLN: 5.0000 mg | Freq: Three times a day (TID) | INTRAMUSCULAR | Status: DC | PRN

## 2014-12-10 MED ORDER — ONDANSETRON HCL 4 MG PO TABS: 4.0000 mg | ORAL_TABLET | Freq: Four times a day (QID) | ORAL | Status: DC | PRN

## 2014-12-10 MED ORDER — ONDANSETRON HCL 4 MG/2ML IJ SOLN: 4.0000 mg | Freq: Four times a day (QID) | INTRAMUSCULAR | Status: DC | PRN

## 2014-12-10 MED ORDER — ALPRAZOLAM 0.25 MG PO TABS: 0.2500 mg | ORAL_TABLET | Freq: Every evening | ORAL | Status: DC | PRN

## 2014-12-10 MED ORDER — DEXTROSE 5 % IV SOLN: 500.0000 mg | Freq: Four times a day (QID) | INTRAVENOUS | Status: DC | PRN

## 2014-12-10 MED ORDER — DIPHENHYDRAMINE HCL 12.5 MG/5ML PO ELIX: 12.5000 mg | ORAL_SOLUTION | ORAL | Status: DC | PRN

## 2014-12-10 MED ORDER — FENTANYL CITRATE (PF) 100 MCG/2ML IJ SOLN
50.0000 ug | INTRAMUSCULAR | Status: DC | PRN
  Administered 2014-12-10: 100 ug via INTRAVENOUS

## 2014-12-10 MED ORDER — FENTANYL CITRATE (PF) 100 MCG/2ML IJ SOLN
INTRAMUSCULAR | Status: AC
  Filled 2014-12-10: qty 2

## 2014-12-10 MED ORDER — SCOPOLAMINE 1 MG/3DAYS TD PT72: 1.0000 | MEDICATED_PATCH | Freq: Once | TRANSDERMAL | Status: DC | PRN

## 2014-12-10 MED ORDER — BUDESONIDE-FORMOTEROL FUMARATE 160-4.5 MCG/ACT IN AERO: 2.0000 | INHALATION_SPRAY | Freq: Two times a day (BID) | RESPIRATORY_TRACT | Status: DC

## 2014-12-10 MED ORDER — BUPIVACAINE-EPINEPHRINE (PF) 0.5% -1:200000 IJ SOLN
INTRAMUSCULAR | Status: DC | PRN
  Administered 2014-12-10: 20 mL via PERINEURAL

## 2014-12-10 MED ORDER — DOCUSATE SODIUM 100 MG PO CAPS
100.0000 mg | ORAL_CAPSULE | Freq: Two times a day (BID) | ORAL | Status: DC
  Administered 2014-12-10: 100 mg via ORAL
  Filled 2014-12-10: qty 1

## 2014-12-10 MED ORDER — SUCCINYLCHOLINE CHLORIDE 20 MG/ML IJ SOLN
INTRAMUSCULAR | Status: DC | PRN
  Administered 2014-12-10: 100 mg via INTRAVENOUS

## 2014-12-10 MED ORDER — LORATADINE 10 MG PO TABS: 10.0000 mg | ORAL_TABLET | Freq: Every day | ORAL | Status: DC

## 2014-12-10 MED ORDER — LIDOCAINE HCL (CARDIAC) 20 MG/ML IV SOLN
INTRAVENOUS | Status: DC | PRN
  Administered 2014-12-10: 50 mg via INTRAVENOUS

## 2014-12-10 MED ORDER — GLYCOPYRROLATE 0.2 MG/ML IJ SOLN: 0.2000 mg | Freq: Once | INTRAMUSCULAR | Status: DC | PRN

## 2014-12-10 MED ORDER — PHENYLEPHRINE HCL 10 MG/ML IJ SOLN
10.0000 mg | INTRAVENOUS | Status: DC | PRN
  Administered 2014-12-10: 50 ug/min via INTRAVENOUS

## 2014-12-10 MED ORDER — ONDANSETRON HCL 4 MG/2ML IJ SOLN
INTRAMUSCULAR | Status: AC
  Filled 2014-12-10: qty 2

## 2014-12-10 MED ORDER — LIDOCAINE HCL 4 % MT SOLN
OROMUCOSAL | Status: DC | PRN
  Administered 2014-12-10: 2 mL via TOPICAL

## 2014-12-10 MED ORDER — METOCLOPRAMIDE HCL 5 MG PO TABS: 5.0000 mg | ORAL_TABLET | Freq: Three times a day (TID) | ORAL | Status: DC | PRN

## 2014-12-10 MED ORDER — ASPIRIN EC 325 MG PO TBEC: 325.0000 mg | DELAYED_RELEASE_TABLET | Freq: Two times a day (BID) | ORAL | Status: DC

## 2014-12-10 MED ORDER — CEFAZOLIN SODIUM-DEXTROSE 2-3 GM-% IV SOLR
INTRAVENOUS | Status: AC
  Filled 2014-12-10: qty 50

## 2014-12-10 MED ORDER — ALBUTEROL SULFATE HFA 108 (90 BASE) MCG/ACT IN AERS: 2.0000 | INHALATION_SPRAY | Freq: Four times a day (QID) | RESPIRATORY_TRACT | Status: DC | PRN

## 2014-12-10 MED ORDER — KETOROLAC TROMETHAMINE 15 MG/ML IJ SOLN
30.0000 mg | Freq: Once | INTRAMUSCULAR | Status: AC
  Administered 2014-12-10: 30 mg via INTRAVENOUS
  Filled 2014-12-10: qty 2

## 2014-12-10 MED ORDER — ARTIFICIAL TEARS OP OINT
TOPICAL_OINTMENT | OPHTHALMIC | Status: AC
  Filled 2014-12-10: qty 3.5

## 2014-12-10 MED ORDER — ZOLPIDEM TARTRATE 5 MG PO TABS: 5.0000 mg | ORAL_TABLET | Freq: Every evening | ORAL | Status: DC | PRN

## 2014-12-10 MED ORDER — MORPHINE SULFATE (PF) 2 MG/ML IV SOLN: 1.0000 mg | INTRAVENOUS | Status: DC | PRN

## 2014-12-10 MED ORDER — PROPOFOL 10 MG/ML IV BOLUS
INTRAVENOUS | Status: DC | PRN
  Administered 2014-12-10: 100 mg via INTRAVENOUS

## 2014-12-10 MED ORDER — CEFAZOLIN SODIUM 1-5 GM-% IV SOLN
1.0000 g | Freq: Four times a day (QID) | INTRAVENOUS | Status: AC
  Administered 2014-12-10 – 2014-12-11 (×3): 1 g via INTRAVENOUS
  Filled 2014-12-10 (×3): qty 50

## 2014-12-10 MED ORDER — ARTIFICIAL TEARS OP OINT
TOPICAL_OINTMENT | OPHTHALMIC | Status: DC | PRN
  Administered 2014-12-10: 1 via OPHTHALMIC

## 2014-12-10 SURGICAL SUPPLY — 74 items
BLADE AVERAGE 25MMX9MM (BLADE)
BLADE AVERAGE 25X9 (BLADE) IMPLANT
BLADE SURG 15 STRL LF DISP TIS (BLADE) ×2 IMPLANT
BLADE SURG 15 STRL SS (BLADE) ×6
CHLORAPREP W/TINT 26ML (MISCELLANEOUS) ×3 IMPLANT
CLEANER CAUTERY TIP 5X5 PAD (MISCELLANEOUS) IMPLANT
CLOSURE WOUND 1/2 X4 (GAUZE/BANDAGES/DRESSINGS) ×1
DECANTER SPIKE VIAL GLASS SM (MISCELLANEOUS) IMPLANT
DRAPE C-ARM 42X72 X-RAY (DRAPES) ×3 IMPLANT
DRAPE INCISE IOBAN 66X45 STRL (DRAPES) ×3 IMPLANT
DRAPE U 20/CS (DRAPES) ×2 IMPLANT
DRAPE U-SHAPE 47X51 STRL (DRAPES) ×4 IMPLANT
DRAPE U-SHAPE 76X120 STRL (DRAPES) ×6 IMPLANT
DRSG TEGADERM 4X4.75 (GAUZE/BANDAGES/DRESSINGS) ×6 IMPLANT
ELECT BLADE 6.5 .24CM SHAFT (ELECTRODE) IMPLANT
ELECT REM PT RETURN 9FT ADLT (ELECTROSURGICAL) ×3
ELECTRODE REM PT RTRN 9FT ADLT (ELECTROSURGICAL) ×1 IMPLANT
GAUZE SPONGE 4X4 12PLY STRL (GAUZE/BANDAGES/DRESSINGS) ×3 IMPLANT
GAUZE SPONGE 4X4 16PLY XRAY LF (GAUZE/BANDAGES/DRESSINGS) IMPLANT
GLOVE BIO SURGEON STRL SZ7 (GLOVE) ×3 IMPLANT
GLOVE BIO SURGEON STRL SZ7.5 (GLOVE) ×3 IMPLANT
GLOVE BIOGEL PI IND STRL 7.0 (GLOVE) ×1 IMPLANT
GLOVE BIOGEL PI IND STRL 8 (GLOVE) ×2 IMPLANT
GLOVE BIOGEL PI INDICATOR 7.0 (GLOVE) ×2
GLOVE BIOGEL PI INDICATOR 8 (GLOVE) ×2
GLOVE ECLIPSE 6.5 STRL STRAW (GLOVE) ×2 IMPLANT
GOWN STRL REUS W/ TWL LRG LVL3 (GOWN DISPOSABLE) ×1 IMPLANT
GOWN STRL REUS W/ TWL XL LVL3 (GOWN DISPOSABLE) IMPLANT
GOWN STRL REUS W/TWL LRG LVL3 (GOWN DISPOSABLE) ×6
GOWN STRL REUS W/TWL XL LVL3 (GOWN DISPOSABLE) ×3
KIT AC JOINT DISP (KITS) ×2 IMPLANT
KIT BIO-TENODESIS 3X8 DISP (MISCELLANEOUS)
KIT INSRT BABSR STRL DISP BTN (MISCELLANEOUS) IMPLANT
MANIFOLD NEPTUNE II (INSTRUMENTS) ×2 IMPLANT
NS IRRIG 1000ML POUR BTL (IV SOLUTION) ×2 IMPLANT
PACK ARTHROSCOPY DSU (CUSTOM PROCEDURE TRAY) ×3 IMPLANT
PACK BASIN DAY SURGERY FS (CUSTOM PROCEDURE TRAY) ×3 IMPLANT
PAD CLEANER CAUTERY TIP 5X5 (MISCELLANEOUS) ×2
PASSER SUT SWANSON 36MM LOOP (INSTRUMENTS) IMPLANT
PENCIL BUTTON HOLSTER BLD 10FT (ELECTRODE) ×3 IMPLANT
RETRIEVER SUT HEWSON (MISCELLANEOUS) ×2 IMPLANT
SHEET MEDIUM DRAPE 40X70 STRL (DRAPES) IMPLANT
SLEEVE SCD COMPRESS KNEE MED (MISCELLANEOUS) ×3 IMPLANT
SLING ARM IMMOBILIZER MED (SOFTGOODS) IMPLANT
SLING ARM LRG ADULT FOAM STRAP (SOFTGOODS) IMPLANT
SLING ARM MED ADULT FOAM STRAP (SOFTGOODS) IMPLANT
SLING ARM XL FOAM STRAP (SOFTGOODS) IMPLANT
SPONGE LAP 4X18 X RAY DECT (DISPOSABLE) ×3 IMPLANT
STRIP CLOSURE SKIN 1/2X4 (GAUZE/BANDAGES/DRESSINGS) ×2 IMPLANT
SUCTION FRAZIER TIP 10 FR DISP (SUCTIONS) ×2 IMPLANT
SUPPORT WRAP ARM LG (MISCELLANEOUS) ×2 IMPLANT
SUT 2 FIBERLOOP 20 STRT BLUE (SUTURE)
SUT ETHIBOND 2 OS 4 DA (SUTURE) IMPLANT
SUT ETHILON 4 0 PS 2 18 (SUTURE) IMPLANT
SUT FIBERWIRE #2 38 T-5 BLUE (SUTURE)
SUT MNCRL AB 3-0 PS2 18 (SUTURE) IMPLANT
SUT MNCRL AB 4-0 PS2 18 (SUTURE) ×3 IMPLANT
SUT PDS 2 CP NEEDLE XSPECIAL (SUTURE) ×1 IMPLANT
SUT PDS AB 0 CT 36 (SUTURE) IMPLANT
SUT PROLENE 3 0 PS 2 (SUTURE) IMPLANT
SUT TIGER TAPE 7 IN WHITE (SUTURE) IMPLANT
SUT VIC AB 0 CT1 27 (SUTURE)
SUT VIC AB 0 CT1 27XBRD ANBCTR (SUTURE) IMPLANT
SUT VIC AB 0 SH 27 (SUTURE) ×2 IMPLANT
SUT VIC AB 2-0 SH 27 (SUTURE) ×3
SUT VIC AB 2-0 SH 27XBRD (SUTURE) ×1 IMPLANT
SUTURE 2 FIBERLOOP 20 STRT BLU (SUTURE) IMPLANT
SUTURE FIBERWR #2 38 T-5 BLUE (SUTURE) IMPLANT
SYR BULB 3OZ (MISCELLANEOUS) ×3 IMPLANT
TAPE FIBER 2MM 7IN #2 BLUE (SUTURE) IMPLANT
TOWEL OR 17X24 6PK STRL BLUE (TOWEL DISPOSABLE) ×3 IMPLANT
TOWEL OR NON WOVEN STRL DISP B (DISPOSABLE) ×3 IMPLANT
YANKAUER SUCT BULB TIP NO VENT (SUCTIONS) ×3 IMPLANT
ZIPLOOP AC JOINT REPAIR (Orthopedic Implant) ×2 IMPLANT

## 2014-12-10 NOTE — Progress Notes (Signed)
Assisted Dr. Massagee with left, ultrasound guided, interscalene  block. Side rails up, monitors on throughout procedure. See vital signs in flow sheet. Tolerated Procedure well. 

## 2014-12-10 NOTE — H&P (View-Only) (Signed)
Brandy [name] is an 59 y.o. female.   Chief Complaint: L distal clavicle fracture HPI: s/p fall with L displaced distal clavicle fracture.  Past Medical History  Diagnosis Date  . Asthmatic bronchitis   . Allergic rhinitis   . Vaginitis   . Vocal cord paresis   . GERD (gastroesophageal reflux disease)   . Complete heart block   . Presence of permanent cardiac pacemaker 08-05-08    Medtronic Adapta  . Nonsustained ventricular tachycardia   . MRSA (methicillin resistant Staphylococcus aureus) 2013    left buttock    Past Surgical History  Procedure Laterality Date  . Vocal cord splint  2005  . Dilation and curettage of uterus  1978  . Hand surgery    . Pacemaker insertion  08/05/08    Medtronic Adapta  . Appendectomy  05-2010    Family History  Problem Relation Age of Onset  . Hypertension Mother   . Coronary artery disease Mother   . Diabetes Father   . Heart disease Father   . Hypertension Sister    Social History:  reports that Brandy [name] has never smoked. Brandy [name] does not have any smokeless tobacco history on file. Brandy [name] reports that Brandy [name] drinks alcohol. Her drug history is not on file.  Allergies:  Allergies  Allergen Reactions  . Erythromycin   . Sulfa Antibiotics Rash    Urticarial rash developed 1 day after completing a 14 day course of TMP/SMX    Medications Prior to Admission  Medication Sig Dispense Refill  . albuterol (PROAIR HFA) 108 (90 BASE) MCG/ACT inhaler Inhale 2 puffs into the lungs every 6 (six) hours as needed for wheezing or shortness of breath. 1 Inhaler prn  . ALPRAZolam (XANAX) 0.25 MG tablet Take 0.25 mg by mouth at bedtime as needed.    Marland Kitchen aspirin 81 MG tablet Take 81 mg by mouth daily.    . budesonide-formoterol (SYMBICORT) 160-4.5 MCG/ACT inhaler Inhale 2 puffs into the lungs 2 (two) times daily. Rinse mouth 1 Inhaler prn  . Calcium Carbonate-Vitamin D (CALCIUM 500 + D) 500-125 MG-UNIT TABS Take 1 tablet by mouth daily.      . cetirizine (ZYRTEC) 10 MG  tablet Take 10 mg by mouth daily.      Marland Kitchen FLUoxetine (PROZAC) 10 MG capsule     . FLUoxetine (PROZAC) 10 MG tablet Take 10 mg by mouth daily.      Marland Kitchen FLUoxetine (PROZAC) 20 MG capsule     . fluticasone (FLONASE) 50 MCG/ACT nasal spray USE 1 SPRAY IN EACH NOSTRIL DAILY 1 g prn  . metoprolol succinate (TOPROL-XL) 25 MG 24 hr tablet TAKE 1 TABLET BY MOUTH DAILY 30 tablet 9  . oxyCODONE-acetaminophen (PERCOCET/ROXICET) 5-325 MG per tablet Take by mouth every 4 (four) hours as needed for severe pain.    . benzonatate (TESSALON) 200 MG capsule TAKE 1 CAPSULE BY MOUTH THREE TIMES DAILY AS NEEDED 30 capsule 5    Results for orders placed or performed during the hospital encounter of 12/08/14 (from the past 48 hour(s))  I-STAT, chem 8     Status: None   Collection Time: 12/08/14 12:49 PM  Result Value Ref Range   Sodium 140 135 - 145 mmol/L   Potassium 3.8 3.5 - 5.1 mmol/L   Chloride 104 101 - 111 mmol/L   BUN 17 6 - 20 mg/dL   Creatinine, Ser 1.00 0.44 - 1.00 mg/dL   Glucose, Bld 92 65 - 99 mg/dL   Calcium, Ion 1.20  1.13 - 1.30 mmol/L   TCO2 26 0 - 100 mmol/L   Hemoglobin 13.9 12.0 - 15.0 g/dL   HCT 41.0 36.0 - 46.0 %   No results found.  Review of Systems  All other systems reviewed and are negative.   Blood pressure 133/91, pulse 72, temperature 98 F (36.7 C), temperature source Oral, resp. rate 18, height 5\' 7"  (1.702 m), weight 64.411 kg (142 lb), SpO2 100 %. Physical Exam  Constitutional: Brandy [name] is oriented to person, place, and time. Brandy [name] appears well-developed and well-nourished.  HENT:  Head: Atraumatic.  Eyes: EOM are normal.  Cardiovascular: Intact distal pulses.   Respiratory: Effort normal.  Musculoskeletal:  L shoulder prominent distal clavicle TTP. Skin intact.  NVID.  Neurological: Brandy [name] is alert and oriented to person, place, and time.  Skin: Skin is warm and dry.  Psychiatric: Brandy [name] has a normal mood and affect.     Assessment/Plan L displaced distal clavicle fx Plan  ORIF with CC lig fixation Risks / benefits of surgery discussed Consent on chart  NPO for OR Preop antibiotics   Arienna Benegas WILLIAM 12/08/2014, 1:08 PM

## 2014-12-10 NOTE — Anesthesia Procedure Notes (Addendum)
Anesthesia Regional Block:  Interscalene brachial plexus block  Pre-Anesthetic Checklist: ,, timeout performed, Correct Patient, Correct Site, Correct Laterality, Correct Procedure, Correct Position, site marked, Risks and benefits discussed,  Surgical consent,  Pre-op evaluation,  At surgeon's request and post-op pain management  Laterality: Upper and Left  Prep: chloraprep and alcohol swabs       Needles:  Injection technique: Single-shot  Needle Type: Stimulator Needle - 40     Needle Length: 4cm 4 cm Needle Gauge: 22 and 22 G  Needle insertion depth: 3 cm   Additional Needles:  Procedures: ultrasound guided (picture in chart) and nerve stimulator Interscalene brachial plexus block  Nerve Stimulator or Paresthesia:  Response: Twitch elicited, 0.5 mA, 0.3 ms,   Additional Responses:   Narrative:  Start time: 12/10/2014 7:00 AM End time: 12/10/2014 7:13 AM Injection made incrementally with aspirations every 5 mL.  Performed by: Personally  Anesthesiologist: MASSAGEE, TERRY  Additional Notes: Block assessed prior to start of surgery   Procedure Name: Intubation Date/Time: 12/10/2014 7:40 AM Performed by: Baxter Flattery Pre-anesthesia Checklist: Patient identified, Emergency Drugs available, Suction available and Patient being monitored Patient Re-evaluated:Patient Re-evaluated prior to inductionOxygen Delivery Method: Circle System Utilized Preoxygenation: Pre-oxygenation with 100% oxygen Intubation Type: IV induction Ventilation: Mask ventilation without difficulty Laryngoscope Size: Miller and 2 Grade View: Grade I Tube type: Oral Tube size: 6.5 mm Number of attempts: 1 Airway Equipment and Method: Stylet,  Oral airway and LTA kit utilized Placement Confirmation: ETT inserted through vocal cords under direct vision,  positive ETCO2 and breath sounds checked- equal and bilateral Secured at: 22 cm Tube secured with: Tape Dental Injury: Teeth and Oropharynx as per  pre-operative assessment

## 2014-12-10 NOTE — Anesthesia Postprocedure Evaluation (Signed)
  Anesthesia Post-op Note  Patient: Brandy [name]  Procedure(s) Performed: Procedure(s): LEFT CORACOCLAVICULAR RECONSTRUCTION WITH  ZIPLOOP (Left)  Patient Location: PACU  Anesthesia Type:GA combined with regional for post-op pain  Level of Consciousness: awake and alert   Airway and Oxygen Therapy: Patient Spontanous Breathing  Post-op Pain: none  Post-op Assessment: Post-op Vital signs reviewed              Post-op Vital Signs: stable  Last Vitals:  Filed Vitals:   12/10/14 1030  BP:   Pulse: 70  Temp:   Resp: 11    Complications: No apparent anesthesia complications

## 2014-12-10 NOTE — Anesthesia Preprocedure Evaluation (Signed)
Anesthesia Evaluation  Patient identified by MRN, date of birth, ID band Patient awake    Reviewed: Allergy & Precautions, NPO status , Patient's Chart, lab work & pertinent test results  History of Anesthesia Complications (+) history of anesthetic complications  Airway Mallampati: I  TM Distance: >3 FB Neck ROM: Full    Dental  (+) Teeth Intact, Dental Advisory Given   Pulmonary asthma ,    breath sounds clear to auscultation       Cardiovascular + dysrhythmias + pacemaker  Rhythm:Regular Rate:Normal  Pacemaker    Neuro/Psych    GI/Hepatic Neg liver ROS, GERD  ,  Endo/Other  negative endocrine ROS  Renal/GU negative Renal ROS     Musculoskeletal   Abdominal   Peds  Hematology negative hematology ROS (+)   Anesthesia Other Findings   Reproductive/Obstetrics                             Anesthesia Physical Anesthesia Plan  ASA: III  Anesthesia Plan: General   Post-op Pain Management: GA combined w/ Regional for post-op pain   Induction: Intravenous  Airway Management Planned: Oral ETT  Additional Equipment:   Intra-op Plan:   Post-operative Plan: Extubation in OR  Informed Consent: I have reviewed the patients History and Physical, chart, labs and discussed the procedure including the risks, benefits and alternatives for the proposed anesthesia with the patient or authorized representative who has indicated his/her understanding and acceptance.   Dental advisory given  Plan Discussed with: CRNA and Surgeon  Anesthesia Plan Comments:         Anesthesia Quick Evaluation

## 2014-12-10 NOTE — Discharge Instructions (Signed)

## 2014-12-10 NOTE — Progress Notes (Signed)
Medtronic rep here to interrogate pacer

## 2014-12-10 NOTE — Transfer of Care (Signed)
Immediate Anesthesia Transfer of Care Note  Patient: Brandy [name]  Procedure(s) Performed: Procedure(s): LEFT CORACOCLAVICULAR RECONSTRUCTION WITH  ZIPLOOP (Left)  Patient Location: PACU  Anesthesia Type:GA combined with regional for post-op pain  Level of Consciousness: awake, alert  and oriented  Airway & Oxygen Therapy: Patient Spontanous Breathing and Patient connected to face mask oxygen  Post-op Assessment: Report given to RN, Post -op Vital signs reviewed and stable and Patient moving all extremities  Post vital signs: Reviewed and stable  Last Vitals:  Filed Vitals:   12/10/14 0726  BP:   Pulse: 80  Temp:   Resp: 13    Complications: No apparent anesthesia complications

## 2014-12-10 NOTE — Op Note (Signed)
Procedure(s): LEFT CORACOCLAVICULAR RECONSTRUCTION WITH  ZIPLOOP Procedure Note  Brandy [name] female 59 y.o. 12/10/2014  Procedure(s) and Anesthesia Type:   #1 LEFT CORACOCLAVICULAR RECONSTRUCTION WITH  ZIPLOOP - General   #2 Open L distal clavicle excision  Surgeon(s) and Role:    * Tania Ade, MD - Primary   Indications:  59 y.o. female s/p fall with left displaced distal clavicle fracture with disruption of the coracoclavicular ligaments. Brandy [name] was indicated for surgical treatment to try and decrease risk of nonunion painful malunion and skin complications. Brandy [name] understood risks benefits alternatives procedure wished to go forward with surgery.     Surgeon: [name]   Assistants: Jeanmarie Hubert PA-C [facility] was present and scrubbed throughout the procedure and was essential in positioning, retraction, exposure, and closure)  Anesthesia: General endotracheal anesthesia with preoperative interscalene block given by the attending anesthesiologist    Procedure Detail  LEFT CORACOCLAVICULAR RECONSTRUCTION WITH  ZIPLOOP  Findings: The distal fragment was extremely comminuted and poor bone quality and I did not feel it would hold any screws were the plate. It measured less than 15 mm and after reconstruction of the coracoclavicular complex with a Biomet zip tight device I felt that leaving the distal fragment in was of little benefit in a simple distal clavicle excision here would eliminate risk of nonunion with very little downside. Therefore a distal clavicle excision was carried out. There was a fragment of bone inferiorly that was attached the coracoclavicular ligaments which was brought back into anatomic alignment with the zip tight configuration.  Estimated Blood Loss:  less than 50 mL         Drains: none  Blood Given: none         Specimens: none        Complications:  * No complications entered in OR log *         Disposition: PACU -  hemodynamically stable.         Condition: stable    Procedure:    DESCRIPTION OF PROCEDURE: The patient was identified in preoperative  holding area where I personally marked the operative site after  verifying site, side, and procedure with the patient. The patient was taken back  to the operating room where general anesthesia was induced without  complication and was placed in the beach-chair position with the back  elevated about 40 degrees and all extremities carefully padded and  positioned.  The left upper extremity was then prepped and  draped in a standard sterile fashion. The appropriate time-out  procedure was carried out. The patient did receive IV antibiotics  within 30 minutes of incision.  An incision was made in [employer] centered over the fracture site. Dissection was carried down through subcutaneous tissues and medial and lateral skin flaps were elevated.  The deltotrapezial fascia was then opened over the clavicle and the  medial and lateral fracture fragments were carefully exposed, taking great care to protect underlying neurovascular structures.  The distal fragment was noted to be comminuted with several longitudinal splits in the fragment. Bone quality was poor and I felt that this would not hold any screws or locking pegs with any reliability. I was able to reduce the more proximal fragment to the inferior piece which was still connected to the coracoclavicular ligaments and felt that with coracoclavicular reconstruction this would allow bone to bone healing. I did not feel platelets further augment this. The dorsal aspect of the coracoid was carefully exposed and a guidepin  was placed centrally and verified with fluoroscopic imaging. A 4.5 mm reamer was used to ream the central hole bicortically. The metallic button device was then passed and flipped beneath the coracoid with excellent fixation and verified with fluoroscopic imaging. The 4.5 reamer was then used to  ream bicortically on the distal clavicle. The sutures were then passed and the button was placed dorsally over the superior clavicle distally. This was medial to the fracture. Then while holding up her pressure on the elbow and down her pressure on the distal clavicle the metallic button device was reduced and secured. Fluoroscopic imaging showed anatomic reduction andvisually I was able to see that the fragment attached to the coracoclavicular ligaments was reduced back anatomically. At this point I again assessed the distal piece which was comminuted and I felt that leaving in place, even without a plate could lead to painful nonunion and felt that resection of this distal fragment was of minimal consequence as it was similar size to what would be resected in a standard [facility] procedure. Therefore the distal fragment was completely excised. At this point final fluoroscopic imaging demonstrated appropriate positioning of the on tunnels and reduction of the coracoclavicular distance.   The wound was copiously irrigated with normal saline and the deltotrapezial fascia was  then carefully closed over the construct with #0 vicryl sutures in  interrupted fashion. The skin was then closed with 2-0 Vicryl in a deep  dermal layer, 4-0 Monocryl for skin closure. Steri-Strips were applied.  10 mL of 0.25% Marcaine with epinephrine were infiltrated for  postoperative pain. Sterile dressings were applied including a medium  Mepilex dressing. The patient was then allowed to awaken from general  anesthesia, placed in a sling, transferred to stretcher and taken to the  recovery room in stable condition.   POSTOPERATIVE PLAN: shewill be kept overnight for pain control and  antibiotics, and will likely be discharged in the morning in a sling.  shewill remain in the sling for 6 weeks postoperatively with the  elbow, wrist, and hand motion only.

## 2014-12-10 NOTE — Interval H&P Note (Signed)
History and Physical Interval Note:  12/10/2014 7:24 AM  Brandy [name]  has presented today for surgery, with the diagnosis of LEFT CLAVICAL DISTAL FRACTURE  The various methods of treatment have been discussed with the patient and family. After consideration of risks, benefits and other options for treatment, the patient has consented to  Procedure(s): LEFT CORACOCLAVICULAR RECONSTRUCTION WITH POSSIBLE OPEN INTERNAL FIXTION (Left) as a surgical intervention .  The patient's history has been reviewed, patient examined, no change in status, stable for surgery.  I have reviewed the patient's chart and labs.  Questions were answered to the patient's satisfaction.     [name]

## 2014-12-11 ENCOUNTER — Encounter (HOSPITAL_BASED_OUTPATIENT_CLINIC_OR_DEPARTMENT_OTHER): Payer: Self-pay | Admitting: Orthopedic Surgery

## 2014-12-11 DIAGNOSIS — S42002A Fracture of unspecified part of left clavicle, initial encounter for closed fracture: Secondary | ICD-10-CM | POA: Diagnosis not present

## 2015-01-04 ENCOUNTER — Ambulatory Visit (INDEPENDENT_AMBULATORY_CARE_PROVIDER_SITE_OTHER): Payer: BC Managed Care – PPO | Admitting: *Deleted

## 2015-01-04 DIAGNOSIS — I442 Atrioventricular block, complete: Secondary | ICD-10-CM

## 2015-01-04 NOTE — Progress Notes (Signed)
Remote pacemaker transmission.   

## 2015-01-06 LAB — CUP PACEART REMOTE DEVICE CHECK
Battery Impedance: 636 Ohm
Battery Remaining Longevity: 67 mo
Battery Voltage: 2.79 V
Brady Statistic AP VP Percent: 94 %
Brady Statistic AS VP Percent: 6 %
Date Time Interrogation Session: 20161003130105
Lead Channel Impedance Value: 464 Ohm
Lead Channel Pacing Threshold Amplitude: 0.5 V
Lead Channel Pacing Threshold Amplitude: 0.625 V
Lead Channel Setting Pacing Amplitude: 2.5 V
Lead Channel Setting Pacing Pulse Width: 0.4 ms
Lead Channel Setting Sensing Sensitivity: 2.8 mV
MDC IDC MSMT LEADCHNL RA PACING THRESHOLD PULSEWIDTH: 0.4 ms
MDC IDC MSMT LEADCHNL RV IMPEDANCE VALUE: 626 Ohm
MDC IDC MSMT LEADCHNL RV PACING THRESHOLD PULSEWIDTH: 0.4 ms
MDC IDC SET LEADCHNL RA PACING AMPLITUDE: 2 V
MDC IDC STAT BRADY AP VS PERCENT: 0 %
MDC IDC STAT BRADY AS VS PERCENT: 0 %

## 2015-01-14 ENCOUNTER — Encounter: Payer: Self-pay | Admitting: Cardiology

## 2015-01-21 ENCOUNTER — Encounter: Payer: Self-pay | Admitting: Cardiovascular Disease

## 2015-02-05 ENCOUNTER — Ambulatory Visit: Payer: BC Managed Care – PPO

## 2015-02-05 ENCOUNTER — Ambulatory Visit (INDEPENDENT_AMBULATORY_CARE_PROVIDER_SITE_OTHER): Payer: BC Managed Care – PPO | Admitting: Family Medicine

## 2015-02-05 ENCOUNTER — Encounter: Payer: Self-pay | Admitting: Family Medicine

## 2015-02-05 ENCOUNTER — Ambulatory Visit (INDEPENDENT_AMBULATORY_CARE_PROVIDER_SITE_OTHER): Payer: BC Managed Care – PPO

## 2015-02-05 VITALS — BP 118/82 | HR 85 | Temp 97.9°F | Resp 18 | Ht 66.25 in | Wt 142.4 lb

## 2015-02-05 DIAGNOSIS — S4992XA Unspecified injury of left shoulder and upper arm, initial encounter: Secondary | ICD-10-CM | POA: Diagnosis not present

## 2015-02-05 DIAGNOSIS — S42032S Displaced fracture of lateral end of left clavicle, sequela: Secondary | ICD-10-CM

## 2015-02-05 DIAGNOSIS — Z23 Encounter for immunization: Secondary | ICD-10-CM

## 2015-02-05 NOTE — Progress Notes (Addendum)
Subjective:    Patient ID: Brandy [name], female    DOB: 1955-09-28, 59 y.o.   MRN: [id number] This chart was scribed for Delman Cheadle, MD by Zola Button, Medical Scribe. This patient was seen in Room 14 and the patient's care was started at 2:20 PM.   Chief Complaint  Patient presents with  . Shoulder Injury    left shoulder    HPI HPI Comments: Brandy [name] is a 59 y.o. female who presents to the [facility] complaining of fall onto her left shoulder that occurred earlier today after she was yanked by one of her dogs who had lost control. She just had her left coracoclavicular reconstruction on 9/8 by Dr. Tamera Punt. She had a displaced clavicular fracture after a fall. Since the surgery, she has been doing better. She is going through physical therapy. She would like a flu shot today.  Past Medical History  Diagnosis Date  . Asthmatic bronchitis   . Allergic rhinitis   . Vaginitis   . Vocal cord paresis   . GERD (gastroesophageal reflux disease)   . Complete heart block ([place])   . Presence of permanent cardiac pacemaker 08-05-08    Medtronic Adapta  . Nonsustained ventricular tachycardia ([place])   . MRSA (methicillin resistant Staphylococcus aureus) 2013    left buttock   Past Surgical History  Procedure Laterality Date  . Vocal cord splint  2005  . Dilation and curettage of uterus  1978  . Hand surgery    . Pacemaker insertion  08/05/08    Medtronic Adapta  . Appendectomy  05-2010  . Acromio-clavicular joint repair Left 12/10/2014    Procedure: LEFT CORACOCLAVICULAR RECONSTRUCTION WITH  ZIPLOOP;  Surgeon: Tania Ade, MD;  Location: [place];  Service: Orthopedics;  Laterality: Left;   Current Outpatient Prescriptions on File Prior to Visit  Medication Sig Dispense Refill  . albuterol (PROAIR HFA) 108 (90 BASE) MCG/ACT inhaler Inhale 2 puffs into the lungs every 6 (six) hours as needed for wheezing or shortness of breath. 1 Inhaler prn  .  ALPRAZolam (XANAX) 0.25 MG tablet Take 0.25 mg by mouth at bedtime as needed.    Marland Kitchen aspirin 81 MG tablet Take 81 mg by mouth daily.    . benzonatate (TESSALON) 200 MG capsule TAKE 1 CAPSULE BY MOUTH THREE TIMES DAILY AS NEEDED 30 capsule 5  . budesonide-formoterol (SYMBICORT) 160-4.5 MCG/ACT inhaler Inhale 2 puffs into the lungs 2 (two) times daily. Rinse mouth 1 Inhaler prn  . Calcium Carbonate-Vitamin D (CALCIUM 500 + D) 500-125 MG-UNIT TABS Take 1 tablet by mouth daily.      . cetirizine (ZYRTEC) 10 MG tablet Take 10 mg by mouth daily.      Marland Kitchen FLUoxetine (PROZAC) 10 MG capsule     . fluticasone (FLONASE) 50 MCG/ACT nasal spray USE 1 SPRAY IN EACH NOSTRIL DAILY 1 g prn  . metoprolol succinate (TOPROL-XL) 25 MG 24 hr tablet TAKE 1 TABLET BY MOUTH DAILY 30 tablet 9  . oxyCODONE-acetaminophen (PERCOCET/ROXICET) 5-325 MG per tablet Take 1-2 tablets by mouth every 4 (four) hours as needed for severe pain. 30 tablet 0   No current facility-administered medications on file prior to visit.   Allergies  Allergen Reactions  . Erythromycin   . Sulfa Antibiotics Rash    Urticarial rash developed 1 day after completing a 14 day course of TMP/SMX   Family History  Problem Relation Age of Onset  . Hypertension Mother   .  Coronary artery disease Mother   . Diabetes Father   . Heart disease Father   . Hypertension Sister    Social History   Social History  . Marital Status: Married    Spouse Name: N/A  . Number of Children: N/A  . Years of Education: N/A   Social History Main Topics  . Smoking status: Never Smoker   . Smokeless tobacco: None  . Alcohol Use: Yes  . Drug Use: None  . Sexual Activity: Not Asked   Other Topics Concern  . None   Social History Narrative    Review of Systems  Constitutional: Negative for fever and chills.  Musculoskeletal: Positive for myalgias, back pain, arthralgias and neck stiffness. Negative for joint swelling, gait problem and neck pain.  Skin:  Positive for rash and wound.  Neurological: Positive for weakness. Negative for numbness.       Objective:  BP 118/82 mmHg  Pulse 85  Temp(Src) 97.9 F (36.6 C) (Oral)  Resp 18  Ht 5' 6.25" (1.683 m)  Wt 142 lb 6.4 oz (64.592 kg)  BMI 22.80 kg/m2  SpO2 94%  Physical Exam  Constitutional: She is oriented to person, place, and time. She appears well-developed and well-nourished. No distress.  HENT:  Head: Normocephalic and atraumatic.  Mouth/Throat: Oropharynx is clear and moist. No oropharyngeal exudate.  Eyes: Pupils are equal, round, and reactive to light.  Neck: Neck supple.  Cardiovascular: Normal rate.   Pulmonary/Chest: Effort normal.  Musculoskeletal: She exhibits no edema.  Neurological: She is alert and oriented to person, place, and time. No cranial nerve deficit.  Skin: Skin is warm and dry. No rash noted.  Psychiatric: She has a normal mood and affect. Her behavior is normal.  Nursing note and vitals reviewed.  [facility] reading (PRIMARY) by  Dr. Brigitte Pulse. Left shoulder - healing well from prior clavicular surgery. No new acute bony inj from fall today. humerus normal.  Dg Shoulder Left  02/05/2015  CLINICAL DATA:  59 year old who fell down 2 steps and injured the left shoulder while walking her dogs earlier today. Recent coracoclavicular ligament reconstruction 12/10/2014. EXAM: LEFT SHOULDER - 2+ VIEW COMPARISON:  Intraoperative left shoulder image 12/10/2014. FINDINGS: No evidence of acute fracture or glenohumeral dislocation. Post surgical changes related to coracoclavicular ligament repair. Postsurgical resection of the head of the clavicle. No evidence of acromioclavicular separation. Battery generator for the patient's indwelling pacemaker overlies the lower scapula on the AP image. IMPRESSION: 1. No acute osseous abnormality. 2. No complicating features post coracoclavicular ligament repair and resection of the head of the clavicle. Electronically Signed   By: Evangeline Dakin M.D.   On: 02/05/2015 15:59    Assessment & Plan:   1. Shoulder injury, left, initial encounter   2. Closed fracture of distal clavicle, left, sequela   3. Need for prophylactic vaccination and inoculation against influenza   No sign of acute injury to recent surg site, rice. rtc if ses cont.  Orders Placed This Encounter  Procedures  . DG Shoulder Left    Standing Status: Future     Number of Occurrences: 1     Standing Expiration Date: 02/05/2016    Order Specific Question:  Reason for Exam (SYMPTOM  OR DIAGNOSIS REQUIRED)    Answer:  fell down steps and landed on area of recent ORIF after clavicle fracture 2 mos prior    Order Specific Question:  Preferred imaging location?    Answer:  External  . Flu Vaccine QUAD  36+ mos IM     I personally performed the services described in this documentation, which was scribed in my presence. The recorded information has been reviewed and considered, and addended by me as needed.  Delman Cheadle, MD MPH   By signing my name below, I, Zola Button, attest that this documentation has been prepared under the direction and in the presence of Delman Cheadle, MD.  Electronically Signed: Zola Button, Medical Scribe. 02/05/2015. 2:38 PM.

## 2015-02-16 ENCOUNTER — Encounter: Payer: Self-pay | Admitting: Internal Medicine

## 2015-04-02 ENCOUNTER — Other Ambulatory Visit: Payer: Self-pay | Admitting: *Deleted

## 2015-04-02 MED ORDER — BENZONATATE 200 MG PO CAPS
ORAL_CAPSULE | ORAL | Status: DC
Start: 2015-04-02 — End: 2015-09-17

## 2015-04-06 ENCOUNTER — Telehealth: Payer: Self-pay | Admitting: Cardiology

## 2015-04-06 ENCOUNTER — Ambulatory Visit (INDEPENDENT_AMBULATORY_CARE_PROVIDER_SITE_OTHER): Payer: BC Managed Care – PPO | Admitting: *Deleted

## 2015-04-06 DIAGNOSIS — I442 Atrioventricular block, complete: Secondary | ICD-10-CM

## 2015-04-06 NOTE — Telephone Encounter (Signed)
Spoke with pt and reminded pt of remote transmission that is due today. Pt verbalized understanding.   

## 2015-04-07 NOTE — Progress Notes (Signed)
Remote pacemaker transmission.   

## 2015-04-14 ENCOUNTER — Encounter: Payer: Self-pay | Admitting: Cardiology

## 2015-04-14 LAB — CUP PACEART REMOTE DEVICE CHECK
Battery Remaining Longevity: 63 mo
Brady Statistic AS VS Percent: 0 %
Implantable Lead Implant Date: 20100505
Lead Channel Impedance Value: 569 Ohm
Lead Channel Pacing Threshold Amplitude: 0.625 V
Lead Channel Pacing Threshold Pulse Width: 0.4 ms
Lead Channel Setting Pacing Amplitude: 2 V
Lead Channel Setting Sensing Sensitivity: 2.8 mV
MDC IDC LEAD IMPLANT DT: 20100505
MDC IDC LEAD LOCATION: 753859
MDC IDC LEAD LOCATION: 753860
MDC IDC MSMT BATTERY IMPEDANCE: 661 Ohm
MDC IDC MSMT BATTERY VOLTAGE: 2.79 V
MDC IDC MSMT LEADCHNL RA IMPEDANCE VALUE: 446 Ohm
MDC IDC MSMT LEADCHNL RA PACING THRESHOLD AMPLITUDE: 0.5 V
MDC IDC MSMT LEADCHNL RA PACING THRESHOLD PULSEWIDTH: 0.4 ms
MDC IDC SESS DTM: 20170103203104
MDC IDC SET LEADCHNL RV PACING AMPLITUDE: 2.5 V
MDC IDC SET LEADCHNL RV PACING PULSEWIDTH: 0.4 ms
MDC IDC STAT BRADY AP VP PERCENT: 86 %
MDC IDC STAT BRADY AP VS PERCENT: 0 %
MDC IDC STAT BRADY AS VP PERCENT: 14 %

## 2015-04-24 ENCOUNTER — Other Ambulatory Visit: Payer: Self-pay | Admitting: Cardiovascular Disease

## 2015-04-27 NOTE — Telephone Encounter (Signed)
Rx request sent to pharmacy.  

## 2015-06-11 ENCOUNTER — Other Ambulatory Visit (HOSPITAL_COMMUNITY): Payer: Self-pay | Admitting: Family Medicine

## 2015-06-11 ENCOUNTER — Ambulatory Visit (HOSPITAL_COMMUNITY)
Admission: RE | Admit: 2015-06-11 | Discharge: 2015-06-11 | Disposition: A | Discharge status: Home / Self Care | Payer: BC Managed Care – PPO | Source: Ambulatory Visit | Attending: Cardiovascular Disease | Admitting: Cardiovascular Disease

## 2015-06-11 ENCOUNTER — Encounter (HOSPITAL_COMMUNITY): Payer: BC Managed Care – PPO

## 2015-06-11 ENCOUNTER — Ambulatory Visit (INDEPENDENT_AMBULATORY_CARE_PROVIDER_SITE_OTHER): Payer: BC Managed Care – PPO | Admitting: Cardiovascular Disease

## 2015-06-11 ENCOUNTER — Encounter: Payer: Self-pay | Admitting: Cardiovascular Disease

## 2015-06-11 VITALS — BP 115/73 | HR 75 | Ht 67.0 in | Wt 150.7 lb

## 2015-06-11 DIAGNOSIS — M7989 Other specified soft tissue disorders: Secondary | ICD-10-CM | POA: Diagnosis not present

## 2015-06-11 DIAGNOSIS — Z950 Presence of cardiac pacemaker: Secondary | ICD-10-CM

## 2015-06-11 DIAGNOSIS — R609 Edema, unspecified: Secondary | ICD-10-CM

## 2015-06-11 DIAGNOSIS — Z79899 Other long term (current) drug therapy: Secondary | ICD-10-CM

## 2015-06-11 DIAGNOSIS — I472 Ventricular tachycardia: Secondary | ICD-10-CM

## 2015-06-11 DIAGNOSIS — R0989 Other specified symptoms and signs involving the circulatory and respiratory systems: Secondary | ICD-10-CM

## 2015-06-11 DIAGNOSIS — I480 Paroxysmal atrial fibrillation: Secondary | ICD-10-CM

## 2015-06-11 DIAGNOSIS — I442 Atrioventricular block, complete: Secondary | ICD-10-CM

## 2015-06-11 DIAGNOSIS — I4729 Other ventricular tachycardia: Secondary | ICD-10-CM

## 2015-06-11 NOTE — Progress Notes (Signed)
Patient ID: Brandy [name], female   DOB: [birth year], 60 y.o.   MRN: [id number]     Cardiology Office Note    Date:  06/11/2015   ID:  Brandy [name], DOB 04-Oct-1955, MRN [id number]  PCP:  Tamsen Roers, MD  Cardiologist:   Sanda Klein, MD   Chief Complaint  Patient presents with  . Pacemaker Check    Medtronic (GRAY) Annual visit pt c/o occasional SOB--asthma, swelling in left leg/ankle    History of Present Illness:  Brandy [name] is a 60 y.o. female with complete heart block who presents for pacemaker follow-up. She also has episodes of asymptomatic ventricular tachycardia (nonsustained) and paroxysmal atrial fibrillation that have been detected by her device.  She has very infrequent and very brief palpitations but denies other cardiovascular complaints. She specifically denies exertional angina or dyspnea and has not expressed syncope, focal neurological events, bleeding problems or claudication. She has had intermittent edema limited to her left lower extremity. This is now more persistent and more severe. There is minimal discomfort in the right groin area when she performs sudden bending or crouching motions. The left leg edema has lead to previous duplex venous ultrasound scanning on 2 occasions in 2014 and 2016. Neither one showed evidence of DVT.  She has not had fever or chills or skin rashes. She does not have groin lymphadenopathy. She denies abdominal pain, change in bowel pattern, gastrointestinal bleeding, flank pain, hematuria, dysuria, cough, hemoptysis or pleurisy.  Pacemaker interrogation shows normal device function. She has virtually 100% ventricular pacing but also has 87% atrial pacing. In the last 6 months there has been 1 new episode of lengthy paroxysmal atrial fibrillation (over 14 hours on December 9) but she has had several other episodes of atrial tachyarrhythmia lasting between a few seconds and 33 minutes. The overall burden of atrial mode switch is  1%. As before, the device is also recorded a small number of episodes of nonsustained ventricular tachycardia. 4 events have been recorded in the last 6 months with ventricular rates of 283 bpm. The longest one was only 5 seconds in duration. Generator voltage suggests remaining battery longevity of 5 years. Lead parameters are excellent. Heart rate histogram distribution appears fair.  Past Medical History  Diagnosis Date  . Asthmatic bronchitis   . Allergic rhinitis   . Vaginitis   . Vocal cord paresis   . GERD (gastroesophageal reflux disease)   . Complete heart block ([place])   . Presence of permanent cardiac pacemaker 08-05-08    Medtronic Adapta  . Nonsustained ventricular tachycardia ([place])   . MRSA (methicillin resistant Staphylococcus aureus) 2013    left buttock    Past Surgical History  Procedure Laterality Date  . Vocal cord splint  2005  . Dilation and curettage of uterus  1978  . Hand surgery    . Pacemaker insertion  08/05/08    Medtronic Adapta  . Appendectomy  05-2010  . Acromio-clavicular joint repair Left 12/10/2014    Procedure: LEFT CORACOCLAVICULAR RECONSTRUCTION WITH  ZIPLOOP;  Surgeon: Tania Ade, MD;  Location: [facility];  Service: Orthopedics;  Laterality: Left;    Outpatient Prescriptions Prior to Visit  Medication Sig Dispense Refill  . albuterol (PROAIR HFA) 108 (90 BASE) MCG/ACT inhaler Inhale 2 puffs into the lungs every 6 (six) hours as needed for wheezing or shortness of breath. 1 Inhaler prn  . ALPRAZolam (XANAX) 0.25 MG tablet Take 0.25 mg by mouth at bedtime as needed.    Marland Kitchen  aspirin 81 MG tablet Take 81 mg by mouth daily.    . benzonatate (TESSALON) 200 MG capsule TAKE 1 CAPSULE BY MOUTH THREE TIMES DAILY AS NEEDED 30 capsule 0  . Calcium Carbonate-Vitamin D (CALCIUM 500 + D) 500-125 MG-UNIT TABS Take 1 tablet by mouth daily.      . cetirizine (ZYRTEC) 10 MG tablet Take 10 mg by mouth daily.      Marland Kitchen FLUoxetine (PROZAC) 10 MG capsule       . fluticasone (FLONASE) 50 MCG/ACT nasal spray USE 1 SPRAY IN EACH NOSTRIL DAILY 1 g prn  . metoprolol succinate (TOPROL-XL) 25 MG 24 hr tablet TAKE 1 TABLET BY MOUTH DAILY 30 tablet 3  . oxyCODONE-acetaminophen (PERCOCET/ROXICET) 5-325 MG per tablet Take 1-2 tablets by mouth every 4 (four) hours as needed for severe pain. 30 tablet 0  . budesonide-formoterol (SYMBICORT) 160-4.5 MCG/ACT inhaler Inhale 2 puffs into the lungs 2 (two) times daily. Rinse mouth 1 Inhaler prn   No facility-administered medications prior to visit.     Allergies:   Erythromycin and Sulfa antibiotics   Social History   Social History  . Marital Status: Married    Spouse Name: N/A  . Number of Children: N/A  . Years of Education: N/A   Social History Main Topics  . Smoking status: Never Smoker   . Smokeless tobacco: None  . Alcohol Use: Yes  . Drug Use: None  . Sexual Activity: Not Asked   Other Topics Concern  . None   Social History Narrative     Family History:  The patient's family history includes Coronary artery disease in her mother; Diabetes in her father; Heart disease in her father; Hypertension in her mother and sister.   ROS:   Please see the history of present illness.    ROS All other systems reviewed and are negative.   PHYSICAL EXAM:   VS:  BP 115/73 mmHg  Pulse 75  Ht 5\' 7"  (1.702 m)  Wt 68.357 kg (150 lb 11.2 oz)  BMI 23.60 kg/m2   GEN: Well nourished, well developed, in no acute distress HEENT: normal Neck: no JVD, carotid bruits, or masses Cardiac: RRR; no murmurs, rubs, or gallops. There is 2+ pitting edema of the left lower extremity up to the knee and even involving the knee area. There is no swelling on the right side. There are 2+ pulses symmetrically in the radials and all nerves, brachials, dorsalis pedis and posterior tibials bilaterally.  Respiratory:  clear to auscultation bilaterally, normal work of breathing GI: soft, nontender, nondistended, + BS MS: no  deformity or atrophy Skin: warm and dry, no rash Neuro:  Alert and Oriented x 3, Strength and sensation are intact Psych: euthymic mood, full affect  Wt Readings from Last 3 Encounters:  06/11/15 68.357 kg (150 lb 11.2 oz)  02/05/15 64.592 kg (142 lb 6.4 oz)  12/10/14 64.411 kg (142 lb)      Studies/Labs Reviewed:   EKG:  EKG is ordered today.  The ekg ordered today demonstrates atrioventricular sequential pacing  Recent Labs: 12/08/2014: BUN 17; Creatinine, Ser 1.00; Hemoglobin 13.9; Potassium 3.8; Sodium 140   Lipid Panel No results found for: CHOL, TRIG, HDL, CHOLHDL, VLDL, LDLCALC, LDLDIRECT    ASSESSMENT:    1. CHB (complete heart block) (HCC)   2. Pacemaker   3. NSVT (nonsustained ventricular tachycardia) ([facility])   4. Paroxysmal atrial fibrillation ([place])   5. Left leg swelling   6. Polypharmacy  PLAN:  In order of problems listed above:  1. CHB: Pacemaker dependent 2. PPM: Normally functioning dual-chamber Medtronic device. Continue [facility] downloads every 3 months and yearly office visits. 3. NSVT: Episodes are very brief and very infrequent, most associating palpitations but no other worrisome symptoms. The frequency and overall prevalence of ventricular arrhythmia has not increased over time. He does not have significant structural heart disease. Continue beta blockers. 4. AFib: The burden of atrial fibrillation is low at about 1%. Rate control was not an issue due to complete heart block. Anticoagulation options were discussed. Her embolic risk is still very low and aspirin 81 mg daily should be sufficient at this time. 5. Left leg edema: She has had recurrent problems with swelling limited to the left lower limb over the last 3 years or so. It is now more obvious and more persistent. 2 previous venous Doppler studies have not shown evidence of DVT. I wonder whether the swelling could represent either extrinsic venous compression from a pelvic mass or even  lymphedema. Have recommended that she undergo a CT scan of the abdomen and pelvis. Arterial and venous Doppler studies have been reordered by Dr. Rex Kras.    Medication Adjustments/Labs and Tests Ordered: Current medicines are reviewed at length with the patient today.  Concerns regarding medicines are outlined above.  Medication changes, Labs and Tests ordered today are listed in the Patient Instructions below. Patient Instructions  Your physician recommends that you return for lab work at least 2 days prior to the CT scan.  Dr Sallyanne Kuster has ordered a CT scan of you abdomen and pelvis. This will be done at [facility].  Your physician recommends that you schedule a follow-up appointment in: 3 months for phone pacercheck and 1 year with him in the office.      Mikael Spray, MD  06/11/2015 1:00 PM    [address], [place], [place]  [address] Phone: [phone]; Fax: [phone]

## 2015-06-11 NOTE — Patient Instructions (Signed)
Your physician recommends that you return for lab work at least 2 days prior to the CT scan.  Dr Sallyanne Kuster has ordered a CT scan of you abdomen and pelvis. This will be done at [place].  Your physician recommends that you schedule a follow-up appointment in: 3 months for phone pacercheck and 1 year with him in the office.

## 2015-06-14 ENCOUNTER — Telehealth: Payer: Self-pay | Admitting: Cardiovascular Disease

## 2015-06-14 NOTE — Telephone Encounter (Signed)
Spoke with patient who reports her lab work was ordered by her PCP - not in EPIC. Advised that she should have the lab work done as her test in Wednesday 3/15 so that the MD can review the lab work prior. She voiced understanding.

## 2015-06-14 NOTE — Telephone Encounter (Addendum)
New message      Pt is due to have labs drawn today.  She had labs drawn on 06-10-15 that included kidney function at a lab at [address].  She is at the lab now but remembered that she may have already had these labs drawn last thurs.  Did we get the labs from last week?  Will she need to have it drawn again?

## 2015-06-15 ENCOUNTER — Ambulatory Visit (HOSPITAL_COMMUNITY)
Admission: RE | Admit: 2015-06-15 | Discharge: 2015-06-15 | Disposition: A | Discharge status: Home / Self Care | Payer: BC Managed Care – PPO | Source: Ambulatory Visit | Attending: Cardiovascular Disease | Admitting: Cardiovascular Disease

## 2015-06-15 DIAGNOSIS — I700 Atherosclerosis of aorta: Secondary | ICD-10-CM | POA: Insufficient documentation

## 2015-06-15 DIAGNOSIS — M7989 Other specified soft tissue disorders: Secondary | ICD-10-CM | POA: Insufficient documentation

## 2015-06-15 DIAGNOSIS — I708 Atherosclerosis of other arteries: Secondary | ICD-10-CM | POA: Insufficient documentation

## 2015-06-15 DIAGNOSIS — R0989 Other specified symptoms and signs involving the circulatory and respiratory systems: Secondary | ICD-10-CM | POA: Diagnosis not present

## 2015-06-15 LAB — BASIC METABOLIC PANEL
BUN: 16 mg/dL (ref 7–25)
CALCIUM: 10.2 mg/dL (ref 8.6–10.4)
CO2: 29 mmol/L (ref 20–31)
CREATININE: 1 mg/dL — AB (ref 0.50–0.99)
Chloride: 104 mmol/L (ref 98–110)
GLUCOSE: 78 mg/dL (ref 65–99)
Potassium: 5.8 mmol/L — ABNORMAL HIGH (ref 3.5–5.3)
SODIUM: 141 mmol/L (ref 135–146)

## 2015-06-16 ENCOUNTER — Telehealth: Payer: Self-pay | Admitting: *Deleted

## 2015-06-16 ENCOUNTER — Ambulatory Visit (HOSPITAL_COMMUNITY)
Admission: RE | Admit: 2015-06-16 | Discharge: 2015-06-16 | Disposition: A | Discharge status: Home / Self Care | Payer: BC Managed Care – PPO | Source: Ambulatory Visit | Attending: Cardiovascular Disease | Admitting: Cardiovascular Disease

## 2015-06-16 ENCOUNTER — Encounter (HOSPITAL_COMMUNITY): Payer: Self-pay

## 2015-06-16 DIAGNOSIS — M7989 Other specified soft tissue disorders: Secondary | ICD-10-CM

## 2015-06-16 DIAGNOSIS — E875 Hyperkalemia: Secondary | ICD-10-CM

## 2015-06-16 MED ORDER — IOHEXOL 300 MG/ML  SOLN
80.0000 mL | Freq: Once | INTRAMUSCULAR | Status: AC | PRN
  Administered 2015-06-16: 100 mL via INTRAVENOUS

## 2015-06-16 MED ORDER — BARIUM SULFATE 2.1 % PO SUSP
ORAL | Status: AC
  Filled 2015-06-16: qty 2

## 2015-06-16 NOTE — Telephone Encounter (Signed)
Spoke with pt, aware of results. Lab orders mailed to the pt

## 2015-06-16 NOTE — Telephone Encounter (Signed)
-----   Message from Sanda Klein, MD sent at 06/15/2015  3:06 PM EDT ----- Renal function normal. The K is a little high, but I suspect this is an artifact due to hemolysis. Would repeat K in several weeks.

## 2015-06-24 LAB — CUP PACEART INCLINIC DEVICE CHECK
Battery Remaining Longevity: 63 mo
Battery Voltage: 2.79 V
Brady Statistic AS VS Percent: 0 %
Implantable Lead Implant Date: 20100505
Implantable Lead Implant Date: 20100505
Implantable Lead Location: 753860
Implantable Lead Model: 5076
Lead Channel Impedance Value: 441 Ohm
Lead Channel Impedance Value: 544 Ohm
Lead Channel Setting Pacing Amplitude: 2 V
Lead Channel Setting Pacing Amplitude: 2.5 V
Lead Channel Setting Sensing Sensitivity: 2.8 mV
MDC IDC LEAD LOCATION: 753859
MDC IDC MSMT BATTERY IMPEDANCE: 661 Ohm
MDC IDC SESS DTM: 20170310155123
MDC IDC SET LEADCHNL RV PACING PULSEWIDTH: 0.4 ms
MDC IDC STAT BRADY AP VP PERCENT: 87 %
MDC IDC STAT BRADY AP VS PERCENT: 0 %
MDC IDC STAT BRADY AS VP PERCENT: 13 %

## 2015-06-29 ENCOUNTER — Encounter: Payer: BC Managed Care – PPO | Admitting: Cardiovascular Disease

## 2015-07-15 LAB — BASIC METABOLIC PANEL
BUN: 16 mg/dL (ref 7–25)
CALCIUM: 9.2 mg/dL (ref 8.6–10.4)
CHLORIDE: 105 mmol/L (ref 98–110)
CO2: 28 mmol/L (ref 20–31)
CREATININE: 0.99 mg/dL (ref 0.50–0.99)
GLUCOSE: 88 mg/dL (ref 65–99)
Potassium: 4.6 mmol/L (ref 3.5–5.3)
Sodium: 137 mmol/L (ref 135–146)

## 2015-08-20 ENCOUNTER — Encounter: Payer: Self-pay | Admitting: Internal Medicine

## 2015-08-20 ENCOUNTER — Ambulatory Visit: Payer: BC Managed Care – PPO | Admitting: Internal Medicine

## 2015-08-20 VITALS — BP 118/72 | HR 82 | Ht 67.0 in | Wt 149.2 lb

## 2015-08-20 DIAGNOSIS — J4520 Mild intermittent asthma, uncomplicated: Secondary | ICD-10-CM

## 2015-08-20 MED ORDER — ALBUTEROL SULFATE HFA 108 (90 BASE) MCG/ACT IN AERS: 2.0000 | INHALATION_SPRAY | Freq: Four times a day (QID) | RESPIRATORY_TRACT | Status: DC | PRN

## 2015-08-20 MED ORDER — FLUTICASONE PROPIONATE 50 MCG/ACT NA SUSP: NASAL | Status: DC

## 2015-08-20 NOTE — Patient Instructions (Signed)
Ok to stay off Symbicort now  Refills sent for albuterol rescue inhaler and for flonase nasal spray  Consider trying true Sudafed (sign for it at pharmacy counter) as needed for nasal congestion  Please call as needed

## 2015-08-20 NOTE — Progress Notes (Signed)
Patient ID: Brandy [name], female    DOB: January 03, 1956, 60 y.o.   MRN: [id number]  HPI 11/24/10-female never smoker followed for allergic rhinitis, asthmatic bronchitis, complicated by GERD, vocal cord paresis/ splint, complete heart block/atrial fibrillation/pacemaker Last here May 19, 2010. Has had an appendectomy since last year. 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 New HVAC system being installed, so construction issues now, but she expects this to be eventually an improvement at her school building..  She had done well but is now starting to notice some head congestion and cough. She continues Symbicort, rescue HFA, and she likes tesalon for cough.. She feels these meds, with generic zyrtec are sufficient.   06/12/11- female never smoker followed for allergic rhinitis, asthmatic bronchitis, complicated by GERD, vocal cord paresis/ splint, complete heart block/atrial fibrillation/pacemaker   PCP Dr Tamsen Roers Winter with little laryngitis and no bad colds. Doing well now. Occasional panic attacks related to stress at work. I had diagnosed depression years ago and she has taken Prozac. She asks to try lorazepam.  12/21/11- 65  female never smoker followed for allergic rhinitis, asthmatic bronchitis, complicated by GERD, Hx vocal cord paresis/ splint, complete heart block/atrial fibrillation/pacemaker    PCP Dr Tamsen Roers Doing okay at this time; had flare ups with weather changes. Gets flu vaccine at school. Carpet removed from classroom floors now - she thinks that helps a lot. Frequent exposure to colds for her school. In the last few days she has had increased postnasal drip, cough, hoarseness, moving into her chest. Husband has had a bad cold.  12/09/12- 4  female never smoker followed for allergic rhinitis, asthmatic bronchitis, complicated by GERD, Hx vocal cord paresis/ splint, complete heart block/atrial fibrillation/pacemaker FOLLOWS FOR: having a flare up for past 10 days-losing  voice, cough, PND. Acute illness, 10 days, laryngitis x5 days. She thinks this is allergy, not a cold. No chest tightness. Some sore throat with postnasal drip.  08/20/14- 46  female never smoker followed for allergic rhinitis, asthmatic bronchitis, complicated by GERD, Hx vocal cord paresis/ splint, complete heart block/atrial fibrillation/pacemaker Follows: PT. IS DOING WELL,  Retired- less exposure to coughs and colds. Minor pollen rhinitis this spring. No change in chronic vocal cord splints, or pacemaker.   08/20/2015-60 year old female never smoker followed for allergic rhinitis, asthmatic bronchitis, complicated by GERD history vocal cord paresis/ splint, CHB/AFib/pacemaker FOLLOWS FOR: increased headaches and nasal drainge due to allergies; Pt states her insurance will not cover Symbicort since Jan 2017-she has went without it since then and states she is doing well with out it. Cough but not bad.     Review of Systems-see HPI Constitutional:   No-   weight loss, night sweats, fevers, chills, fatigue, lassitude. HEENT:   No-  headaches, difficulty swallowing, tooth/dental problems, sore throat,       No-  sneezing, itching, ear ache,+ mild-nasal congestion, +post nasal drip,  CV:  No-   chest pain, orthopnea, PND, swelling in lower extremities, anasarca, dizziness, palpitations Resp: No-   shortness of breath with exertion or at rest.              No-   productive cough,  no- non-productive cough,  No-  coughing up of blood.              No-   change in color of mucus.  No- wheezing.   Skin: No-   rash or lesions. GI:  No-   heartburn, indigestion, abdominal pain, nausea, vomiting,  GU:  MS:  No-   joint pain or swelling.   Neuro- nothing unusual  Psych:  No- change in mood or affect. No depression or anxiety.  No memory loss.    Objective:   Physical Exam General- Alert, Oriented, Affect-appropriate, Distress- none acute Skin- rash-none, lesions- none, excoriation-  none Lymphadenopathy- none Head- atraumatic            Eyes- Gross vision intact, PERRLA, conjunctivae clear secretions            Ears- Hearing, canals normal            Nose- +sniffing, No Septal dev, mucus, polyps, erosion, perforation             Throat- Mallampati II , mucosa -clear, not red , drainage- none, tonsils- atrophic;  +voice quality hoarse Neck- flexible , trachea midline, no stridor , thyroid nl, carotid no bruit Chest - symmetrical excursion , unlabored           Heart/CV- RRR , no murmur , no gallop  , no rub, nl s1 s2                           - JVD- none , edema- none, stasis changes- none, varices- none           Lung- clear to P&A, wheeze- none, cough- none , dullness-none, rub- none           Chest wall- +L pacemaker Abd-  Br/ Gen/ Rectal- Not done, not indicated Extrem- cyanosis- none, clubbing, none, atrophy- none, strength- nl Neuro- grossly intact to observation

## 2015-09-06 ENCOUNTER — Other Ambulatory Visit: Payer: Self-pay | Admitting: Cardiovascular Disease

## 2015-09-13 ENCOUNTER — Ambulatory Visit (INDEPENDENT_AMBULATORY_CARE_PROVIDER_SITE_OTHER): Payer: BC Managed Care – PPO | Admitting: *Deleted

## 2015-09-13 DIAGNOSIS — I442 Atrioventricular block, complete: Secondary | ICD-10-CM | POA: Diagnosis not present

## 2015-09-13 NOTE — Progress Notes (Signed)
Remote pacemaker transmission.   

## 2015-09-16 LAB — CUP PACEART REMOTE DEVICE CHECK
Battery Impedance: 735 Ohm
Battery Remaining Longevity: 60 mo
Battery Voltage: 2.79 V
Brady Statistic AP VP Percent: 88 %
Brady Statistic AS VS Percent: 0 %
Date Time Interrogation Session: 20170610003631
Implantable Lead Implant Date: 20100505
Implantable Lead Location: 753860
Lead Channel Impedance Value: 446 Ohm
Lead Channel Impedance Value: 553 Ohm
Lead Channel Setting Pacing Amplitude: 2 V
Lead Channel Setting Pacing Amplitude: 2.5 V
Lead Channel Setting Pacing Pulse Width: 0.4 ms
Lead Channel Setting Sensing Sensitivity: 2.8 mV
MDC IDC LEAD IMPLANT DT: 20100505
MDC IDC LEAD LOCATION: 753859
MDC IDC MSMT LEADCHNL RA PACING THRESHOLD AMPLITUDE: 0.375 V
MDC IDC MSMT LEADCHNL RA PACING THRESHOLD PULSEWIDTH: 0.4 ms
MDC IDC MSMT LEADCHNL RV PACING THRESHOLD AMPLITUDE: 0.625 V
MDC IDC MSMT LEADCHNL RV PACING THRESHOLD PULSEWIDTH: 0.4 ms
MDC IDC STAT BRADY AP VS PERCENT: 0 %
MDC IDC STAT BRADY AS VP PERCENT: 11 %

## 2015-09-17 ENCOUNTER — Other Ambulatory Visit: Payer: Self-pay | Admitting: *Deleted

## 2015-09-17 MED ORDER — BENZONATATE 200 MG PO CAPS: ORAL_CAPSULE | ORAL | Status: DC

## 2015-09-21 ENCOUNTER — Telehealth: Payer: Self-pay | Admitting: Internal Medicine

## 2015-09-21 MED ORDER — PREDNISONE 10 MG PO TABS: ORAL_TABLET | ORAL | Status: DC

## 2015-09-21 MED ORDER — AZITHROMYCIN 250 MG PO TABS: ORAL_TABLET | ORAL | Status: DC

## 2015-09-21 NOTE — Telephone Encounter (Signed)
Ok offer Z pak,  Prednisone 10 mg, # 20, 4 X 2 DAYS, 3 X 2 DAYS, 2 X 2 DAYS, 1 X 2 DAYS  Please tell her we hope her husband does ok

## 2015-09-21 NOTE — Telephone Encounter (Signed)
Pt states that she has chronic bronchitis and she is now symptomatic. States that she has been sick x 3 weeks.  Pt c/ cough with chest congestion and unable to get mucus up. Using Mucinex for congestion. Pt notes a fever 4 days ago (99.5) - pt states her normal is 97.6. Pt states that Pred taper and Zpak has worked in the past. Pt states that her husband is in the middle of chemo treatments  Please advise Dr Annamaria Boots. Thanks.   Allergies  Allergen Reactions  . Erythromycin   . Sulfa Antibiotics Rash    Urticarial rash developed 1 day after completing a 14 day course of TMP/SMX     Medication List       This list is accurate as of: 09/21/15 12:37 PM.  Always use your most recent med list.               albuterol 108 (90 Base) MCG/ACT inhaler  Commonly known as:  PROAIR HFA  Inhale 2 puffs into the lungs every 6 (six) hours as needed for wheezing or shortness of breath.     ALPRAZolam 0.25 MG tablet  Commonly known as:  XANAX  Take 0.25 mg by mouth at bedtime as needed.     aspirin 81 MG tablet  Take 81 mg by mouth daily.     benzonatate 200 MG capsule  Commonly known as:  TESSALON  TAKE 1 CAPSULE BY MOUTH THREE TIMES DAILY AS NEEDED     CALCIUM 500 + D 500-125 MG-UNIT Tabs  Generic drug:  Calcium Carbonate-Vitamin D  Take 1 tablet by mouth daily.     cetirizine 10 MG tablet  Commonly known as:  ZYRTEC  Take 10 mg by mouth daily.     FLUoxetine 10 MG capsule  Commonly known as:  PROZAC     fluticasone 50 MCG/ACT nasal spray  Commonly known as:  FLONASE  USE 1 SPRAY IN EACH NOSTRIL DAILY     metoprolol succinate 25 MG 24 hr tablet  Commonly known as:  TOPROL-XL  TAKE 1 TABLET BY MOUTH DAILY

## 2015-09-21 NOTE — Telephone Encounter (Signed)
Spoke with the pt and notified of recs per CDY  She verbalized understanding  Nothing further needed Rxs sent

## 2015-09-23 ENCOUNTER — Encounter: Payer: Self-pay | Admitting: Cardiology

## 2015-12-13 ENCOUNTER — Ambulatory Visit (INDEPENDENT_AMBULATORY_CARE_PROVIDER_SITE_OTHER): Payer: BC Managed Care – PPO | Admitting: *Deleted

## 2015-12-13 ENCOUNTER — Telehealth: Payer: Self-pay | Admitting: Cardiology

## 2015-12-13 DIAGNOSIS — I442 Atrioventricular block, complete: Secondary | ICD-10-CM

## 2015-12-13 NOTE — Telephone Encounter (Signed)
Spoke with pt and reminded pt of remote transmission that is due today. Pt verbalized understanding.   

## 2015-12-15 LAB — CUP PACEART REMOTE DEVICE CHECK
Battery Impedance: 887 Ohm
Brady Statistic AP VS Percent: 0 %
Brady Statistic AS VP Percent: 11 %
Brady Statistic AS VS Percent: 1 %
Implantable Lead Location: 753859
Implantable Lead Model: 5076
Lead Channel Impedance Value: 549 Ohm
Lead Channel Pacing Threshold Amplitude: 0.5 V
Lead Channel Pacing Threshold Pulse Width: 0.4 ms
Lead Channel Setting Pacing Amplitude: 2.5 V
Lead Channel Setting Sensing Sensitivity: 2.8 mV
MDC IDC LEAD IMPLANT DT: 20100505
MDC IDC LEAD IMPLANT DT: 20100505
MDC IDC LEAD LOCATION: 753860
MDC IDC MSMT BATTERY REMAINING LONGEVITY: 54 mo
MDC IDC MSMT BATTERY VOLTAGE: 2.79 V
MDC IDC MSMT LEADCHNL RA IMPEDANCE VALUE: 464 Ohm
MDC IDC MSMT LEADCHNL RA PACING THRESHOLD AMPLITUDE: 0.5 V
MDC IDC MSMT LEADCHNL RV PACING THRESHOLD PULSEWIDTH: 0.4 ms
MDC IDC SESS DTM: 20170911201849
MDC IDC SET LEADCHNL RA PACING AMPLITUDE: 2 V
MDC IDC SET LEADCHNL RV PACING PULSEWIDTH: 0.4 ms
MDC IDC STAT BRADY AP VP PERCENT: 89 %

## 2015-12-15 NOTE — Progress Notes (Signed)
Remote pacemaker transmission.   

## 2015-12-16 ENCOUNTER — Encounter: Payer: Self-pay | Admitting: Cardiology

## 2015-12-31 ENCOUNTER — Telehealth: Payer: Self-pay | Admitting: Internal Medicine

## 2015-12-31 MED ORDER — AMOXICILLIN-POT CLAVULANATE 875-125 MG PO TABS: 1.0000 | ORAL_TABLET | Freq: Two times a day (BID) | ORAL | 0 refills | Status: DC

## 2015-12-31 NOTE — Telephone Encounter (Signed)
Called spoke with pt. She reports she thinks she has a sinus infection. C/o nasal congestion, chest congestion, facial pressure, prod cough (yellow-green phlem) Using sinus rinse and she gets bloody mucus out. Taking tess pearls, OTC generic mucinex.  Please advise Dr. Annamaria Boots thanks  Allergies  Allergen Reactions  . Erythromycin   . Sulfa Antibiotics Rash    Urticarial rash developed 1 day after completing a 14 day course of TMP/SMX     Current Outpatient Prescriptions on File Prior to Visit  Medication Sig Dispense Refill  . albuterol (PROAIR HFA) 108 (90 Base) MCG/ACT inhaler Inhale 2 puffs into the lungs every 6 (six) hours as needed for wheezing or shortness of breath. 1 Inhaler prn  . ALPRAZolam (XANAX) 0.25 MG tablet Take 0.25 mg by mouth at bedtime as needed.    Marland Kitchen aspirin 81 MG tablet Take 81 mg by mouth daily.    Marland Kitchen azithromycin (ZITHROMAX Z-PAK) 250 MG tablet As directed 6 each 0  . benzonatate (TESSALON) 200 MG capsule TAKE 1 CAPSULE BY MOUTH THREE TIMES DAILY AS NEEDED 30 capsule 12  . Calcium Carbonate-Vitamin D (CALCIUM 500 + D) 500-125 MG-UNIT TABS Take 1 tablet by mouth daily.      . cetirizine (ZYRTEC) 10 MG tablet Take 10 mg by mouth daily.      Marland Kitchen FLUoxetine (PROZAC) 10 MG capsule     . fluticasone (FLONASE) 50 MCG/ACT nasal spray USE 1 SPRAY IN EACH NOSTRIL DAILY 1 g prn  . metoprolol succinate (TOPROL-XL) 25 MG 24 hr tablet TAKE 1 TABLET BY MOUTH DAILY 30 tablet 10  . predniSONE (DELTASONE) 10 MG tablet 4 x 2 days, 3 x 2 days, 2 x 2 days,  1 x 2 days, 20 tablet 0   No current facility-administered medications on file prior to visit.

## 2015-12-31 NOTE — Telephone Encounter (Signed)
Called spoke with pt. Aware of recs. Rx sent in. Nothing further needed

## 2015-12-31 NOTE — Telephone Encounter (Signed)
Offer augmentin 875, # 20, 1 twice daily x 10 days

## 2016-03-13 ENCOUNTER — Telehealth: Payer: Self-pay | Admitting: Cardiology

## 2016-03-13 ENCOUNTER — Ambulatory Visit (INDEPENDENT_AMBULATORY_CARE_PROVIDER_SITE_OTHER): Payer: BC Managed Care – PPO | Admitting: *Deleted

## 2016-03-13 DIAGNOSIS — I442 Atrioventricular block, complete: Secondary | ICD-10-CM

## 2016-03-13 NOTE — Telephone Encounter (Signed)
LMOVM reminding pt to send remote transmission.   

## 2016-03-14 NOTE — Progress Notes (Signed)
Remote pacemaker transmission.   

## 2016-03-17 ENCOUNTER — Encounter: Payer: Self-pay | Admitting: Cardiology

## 2016-03-24 LAB — CUP PACEART REMOTE DEVICE CHECK
Battery Impedance: 964 Ohm
Brady Statistic AP VS Percent: 0 %
Brady Statistic AS VS Percent: 1 %
Date Time Interrogation Session: 20171211231815
Implantable Lead Implant Date: 20100505
Implantable Lead Location: 753859
Implantable Lead Model: 5076
Lead Channel Impedance Value: 584 Ohm
Lead Channel Pacing Threshold Amplitude: 0.625 V
Lead Channel Pacing Threshold Pulse Width: 0.4 ms
Lead Channel Pacing Threshold Pulse Width: 0.4 ms
MDC IDC LEAD IMPLANT DT: 20100505
MDC IDC LEAD LOCATION: 753860
MDC IDC MSMT BATTERY REMAINING LONGEVITY: 52 mo
MDC IDC MSMT BATTERY VOLTAGE: 2.78 V
MDC IDC MSMT LEADCHNL RA IMPEDANCE VALUE: 440 Ohm
MDC IDC MSMT LEADCHNL RA PACING THRESHOLD AMPLITUDE: 0.375 V
MDC IDC PG IMPLANT DT: 20100505
MDC IDC SET LEADCHNL RA PACING AMPLITUDE: 2 V
MDC IDC SET LEADCHNL RV PACING AMPLITUDE: 2.5 V
MDC IDC SET LEADCHNL RV PACING PULSEWIDTH: 0.4 ms
MDC IDC SET LEADCHNL RV SENSING SENSITIVITY: 2 mV
MDC IDC STAT BRADY AP VP PERCENT: 88 %
MDC IDC STAT BRADY AS VP PERCENT: 11 %

## 2016-06-21 ENCOUNTER — Ambulatory Visit (INDEPENDENT_AMBULATORY_CARE_PROVIDER_SITE_OTHER): Payer: BC Managed Care – PPO | Admitting: Cardiovascular Disease

## 2016-06-21 ENCOUNTER — Encounter: Payer: Self-pay | Admitting: Cardiovascular Disease

## 2016-06-21 VITALS — BP 112/72 | HR 75 | Ht 67.0 in | Wt 149.4 lb

## 2016-06-21 DIAGNOSIS — Z950 Presence of cardiac pacemaker: Secondary | ICD-10-CM

## 2016-06-21 DIAGNOSIS — I472 Ventricular tachycardia: Secondary | ICD-10-CM

## 2016-06-21 DIAGNOSIS — I442 Atrioventricular block, complete: Secondary | ICD-10-CM

## 2016-06-21 DIAGNOSIS — I480 Paroxysmal atrial fibrillation: Secondary | ICD-10-CM | POA: Diagnosis not present

## 2016-06-21 DIAGNOSIS — I4729 Other ventricular tachycardia: Secondary | ICD-10-CM

## 2016-06-21 LAB — CUP PACEART INCLINIC DEVICE CHECK
Battery Remaining Longevity: 49 mo
Brady Statistic AP VS Percent: 0 %
Brady Statistic AS VS Percent: 0 %
Implantable Lead Implant Date: 20100505
Implantable Lead Location: 753859
Implantable Lead Model: 5076
Implantable Pulse Generator Implant Date: 20100505
Lead Channel Pacing Threshold Amplitude: 0.5 V
Lead Channel Pacing Threshold Amplitude: 0.625 V
Lead Channel Pacing Threshold Pulse Width: 0.4 ms
Lead Channel Pacing Threshold Pulse Width: 0.4 ms
Lead Channel Pacing Threshold Pulse Width: 0.4 ms
Lead Channel Pacing Threshold Pulse Width: 0.4 ms
Lead Channel Sensing Intrinsic Amplitude: 1 mV
Lead Channel Setting Pacing Amplitude: 2 V
Lead Channel Setting Pacing Pulse Width: 0.4 ms
MDC IDC LEAD IMPLANT DT: 20100505
MDC IDC LEAD LOCATION: 753860
MDC IDC MSMT BATTERY IMPEDANCE: 1041 Ohm
MDC IDC MSMT BATTERY VOLTAGE: 2.78 V
MDC IDC MSMT LEADCHNL RA IMPEDANCE VALUE: 446 Ohm
MDC IDC MSMT LEADCHNL RA PACING THRESHOLD AMPLITUDE: 0.375 V
MDC IDC MSMT LEADCHNL RV IMPEDANCE VALUE: 544 Ohm
MDC IDC MSMT LEADCHNL RV PACING THRESHOLD AMPLITUDE: 0.75 V
MDC IDC MSMT LEADCHNL RV SENSING INTR AMPL: 5.6 mV
MDC IDC SESS DTM: 20180321125048
MDC IDC SET LEADCHNL RV PACING AMPLITUDE: 2.5 V
MDC IDC SET LEADCHNL RV SENSING SENSITIVITY: 2 mV
MDC IDC STAT BRADY AP VP PERCENT: 88 %
MDC IDC STAT BRADY AS VP PERCENT: 11 %

## 2016-06-21 NOTE — Patient Instructions (Signed)
Dr Sallyanne Kuster recommends that you continue on your current medications as directed. Please refer to the Current Medication list given to you today.  Remote monitoring is used to monitor your Pacemaker of ICD from home. This monitoring reduces the number of office visits required to check your device to one time per year. It allows [place] to keep an eye on the functioning of your device to ensure it is working properly. You are scheduled for a device check from home on Wednesday, June 20th, 2018. You may send your transmission at any time that day. If you have a wireless device, the transmission will be sent automatically. After your physician reviews your transmission, you will receive a postcard with your next transmission date.  Dr Sallyanne Kuster recommends that you schedule a follow-up appointment in 12 months with a pacemaker check. You will receive a reminder letter in the mail two months in advance. If you don't receive a letter, please call our office to schedule the follow-up appointment.  If you need a refill on your cardiac medications before your next appointment, please call your pharmacy.

## 2016-06-21 NOTE — Progress Notes (Signed)
Patient ID: Brandy [name], female   DOB: [birth year], 61 y.o.   MRN: [id number]     Cardiology Office Note    Date:  06/21/2016   ID:  Brandy [name], DOB [birth year], MRN [id number]  PCP:  Tamsen Roers, MD  Cardiologist:   Sanda Klein, MD   Chief Complaint  Patient presents with  . yearly exam    last thursday around dinnertime she had ache in right side of chest x4 - resolved on its own; shortness of breath with exertion (asthma related?)    History of Present Illness:  Brandy [name] is a 61 y.o. female with complete heart block who presents for pacemaker follow-up. She also has episodes of asymptomatic ventricular tachycardia (nonsustained) and paroxysmal atrial fibrillation that have been detected by her device.  The last year she has been very preoccupied by her husband's cancer diagnosis. He is currently in remission and overall her level of emotional stress has diminished.  She has very infrequent and very brief palpitations but denies other cardiovascular complaints. She specifically denies exertional angina or dyspnea and has not expressed syncope, focal neurological events, bleeding problems or claudication. She has had intermittent edema limited to her left lower extremity, pattern unchanged from the past. She has not had fever or chills or skin rashes. She does not have groin lymphadenopathy. She denies abdominal pain, change in bowel pattern, gastrointestinal bleeding, flank pain, hematuria, dysuria, cough, hemoptysis or pleurisy.  Pacemaker interrogation shows normal device function. Current device was implanted in 2010 and has an expected generator longevity of 4 years. She has 99.4% ventricular pacing but also has 88% atrial pacing.   Her device has recorded infrequent episodes of nonsustained ventricular tachycardia at around 280-100 90 bpm for a maximum of 11 beats. She does describe occasional palpitations. Her device has also recorded a relatively low burden of  paroxysmal atrial fibrillation overall 0.4% of the time. The last more significant episode occurred in November, 47 hours. The most recent episode lasted only 4 minutes in December. She has not had atrial fibrillation in the last 3 months.  Lead parameters are excellent. Heart rate histogram distribution appears fair.  Past Medical History:  Diagnosis Date  . Allergic rhinitis   . Asthmatic bronchitis   . Complete heart block ([place])   . GERD (gastroesophageal reflux disease)   . MRSA (methicillin resistant Staphylococcus aureus) 2013   left buttock  . Nonsustained ventricular tachycardia ([place])   . 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 Vaginitis   . Vocal cord paresis     Past Surgical History:  Procedure Laterality Date  . ACROMIO-CLAVICULAR JOINT REPAIR Left 12/10/2014   Procedure: LEFT CORACOCLAVICULAR RECONSTRUCTION WITH  ZIPLOOP;  Surgeon: Tania Ade, MD;  Location: [facility];  Service: Orthopedics;  Laterality: Left;  . APPENDECTOMY  05-2010  . DILATION AND CURETTAGE OF UTERUS  1978  . HAND SURGERY    . PACEMAKER INSERTION  08/05/08   Medtronic Adapta  . Vocal cord splint  2005    Outpatient Medications Prior to Visit  Medication Sig Dispense Refill  . albuterol (PROAIR HFA) 108 (90 Base) MCG/ACT inhaler Inhale 2 puffs into the lungs every 6 (six) hours as needed for wheezing or shortness of breath. 1 Inhaler prn  . ALPRAZolam (XANAX) 0.25 MG tablet Take 0.25 mg by mouth at bedtime as needed.    Marland Kitchen aspirin 81 MG tablet Take 81 mg by mouth daily.    Marland Kitchen  benzonatate (TESSALON) 200 MG capsule TAKE 1 CAPSULE BY MOUTH THREE TIMES DAILY AS NEEDED 30 capsule 12  . Calcium Carbonate-Vitamin D (CALCIUM 500 + D) 500-125 MG-UNIT TABS Take 1 tablet by mouth daily.      . cetirizine (ZYRTEC) 10 MG tablet Take 10 mg by mouth daily.      . fluticasone (FLONASE) 50 MCG/ACT nasal spray USE 1 SPRAY IN EACH NOSTRIL DAILY 1 g prn  . metoprolol  succinate (TOPROL-XL) 25 MG 24 hr tablet TAKE 1 TABLET BY MOUTH DAILY 30 tablet 10  . FLUoxetine (PROZAC) 10 MG capsule     . amoxicillin-clavulanate (AUGMENTIN) 875-125 MG tablet Take 1 tablet by mouth 2 (two) times daily. 20 tablet 0  . azithromycin (ZITHROMAX Z-PAK) 250 MG tablet As directed 6 each 0  . predniSONE (DELTASONE) 10 MG tablet 4 x 2 days, 3 x 2 days, 2 x 2 days,  1 x 2 days, 20 tablet 0   No facility-administered medications prior to visit.      Allergies:   Erythromycin and Sulfa antibiotics   Social History   Social History  . Marital status: Married    Spouse name: N/A  . Number of children: N/A  . Years of education: N/A   Social History Main Topics  . Smoking status: Never Smoker  . Smokeless tobacco: Never Used  . Alcohol use Yes  . Drug use: Unknown  . Sexual activity: Not Asked   Other Topics Concern  . None   Social History Narrative  . None     Family History:  The patient's family history includes Coronary artery disease in her mother; Diabetes in her father; Heart disease in her father; Hypertension in her mother and sister.   ROS:   Please see the history of present illness.    ROS All other systems reviewed and are negative.   PHYSICAL EXAM:   VS:  BP 112/72   Pulse 75   Ht 5\' 7"  (1.702 m)   Wt 67.8 kg (149 lb 6.4 oz)   BMI 23.40 kg/m    GEN: Well nourished, well developed, in no acute distress  HEENT: normal  Neck: no JVD, carotid bruits, or masses Cardiac: RRR; no murmurs, rubs, or gallops. There is 2+ pitting edema of the left lower extremity up to the knee and even involving the knee area. There is no swelling on the right side. There are 2+ pulses symmetrically in the radials and all nerves, brachials, dorsalis pedis and posterior tibials bilaterally.  Respiratory:  clear to auscultation bilaterally, normal work of breathing GI: soft, nontender, nondistended, + BS MS: no deformity or atrophy  Skin: warm and dry, no rash Neuro:   Alert and Oriented x 3, Strength and sensation are intact Psych: euthymic mood, full affect  Wt Readings from Last 3 Encounters:  06/21/16 67.8 kg (149 lb 6.4 oz)  08/20/15 67.7 kg (149 lb 3.2 oz)  06/11/15 68.4 kg (150 lb 11.2 oz)      Studies/Labs Reviewed:   EKG:  EKG is ordered today.  The ekg ordered today demonstrates atrioventricular sequential pacing  Recent Labs: 07/14/2015: BUN 16; Creat 0.99; Potassium 4.6; Sodium 137   Lipid Panel No results found for: CHOL, TRIG, HDL, CHOLHDL, VLDL, LDLCALC, LDLDIRECT    ASSESSMENT:    No diagnosis found.   PLAN:  In order of problems listed above:  1. CHB: Pacemaker dependent 2. PPM: Normally functioning dual-chamber Medtronic device. Continue [facility] downloads every 3 months and  yearly office visits. 3. NSVT: Episodes are very brief and very infrequent, most associating palpitations but no other worrisome symptoms. The frequency and overall prevalence of ventricular arrhythmia has not increased over time. She does not have significant structural heart disease. Continue beta blockers. 4. AFib: The burden of atrial fibrillation is low at <1%. Rate control is not an issue due to complete heart block. Anticoagulation options were discussed. CHADSVasc 1 (gender only). Her embolic risk is still very low and aspirin 81 mg daily should be sufficient at this time. She knows that she needs to call promptly if she develops even very brief focal neurological symptoms. They also need to consider anticoagulation if she develops congestive heart failure, hypertension or diabetes mellitus.    Medication Adjustments/Labs and Tests Ordered: Current medicines are reviewed at length with the patient today.  Concerns regarding medicines are outlined above.  Medication changes, Labs and Tests ordered today are listed in the Patient Instructions below. Patient Instructions  Dr Sallyanne Kuster recommends that you continue on your current medications as  directed. Please refer to the Current Medication list given to you today.  Remote monitoring is used to monitor your Pacemaker of ICD from home. This monitoring reduces the number of office visits required to check your device to one time per year. It allows [place] to keep an eye on the functioning of your device to ensure it is working properly. You are scheduled for a device check from home on Wednesday, June 20th, 2018. You may send your transmission at any time that day. If you have a wireless device, the transmission will be sent automatically. After your physician reviews your transmission, you will receive a postcard with your next transmission date.  Dr Sallyanne Kuster recommends that you schedule a follow-up appointment in 12 months with a pacemaker check. You will receive a reminder letter in the mail two months in advance. If you don't receive a letter, please call our office to schedule the follow-up appointment.  If you need a refill on your cardiac medications before your next appointment, please call your pharmacy.      Signed, Sanda Klein, MD  06/21/2016 10:05 AM    [facility] [address], [place], [place]  [address] Phone: [phone]; Fax: [phone]

## 2016-06-23 DIAGNOSIS — I480 Paroxysmal atrial fibrillation: Secondary | ICD-10-CM | POA: Insufficient documentation

## 2016-08-04 ENCOUNTER — Other Ambulatory Visit: Payer: Self-pay | Admitting: Cardiovascular Disease

## 2016-08-09 ENCOUNTER — Encounter: Payer: Self-pay | Admitting: Internal Medicine

## 2016-08-09 ENCOUNTER — Ambulatory Visit (INDEPENDENT_AMBULATORY_CARE_PROVIDER_SITE_OTHER): Payer: BC Managed Care – PPO | Admitting: Internal Medicine

## 2016-08-09 VITALS — BP 118/68 | HR 79 | Resp 14 | Ht 67.0 in | Wt 148.2 lb

## 2016-08-09 DIAGNOSIS — J301 Allergic rhinitis due to pollen: Secondary | ICD-10-CM

## 2016-08-09 DIAGNOSIS — I442 Atrioventricular block, complete: Secondary | ICD-10-CM

## 2016-08-09 DIAGNOSIS — J3800 Paralysis of vocal cords and larynx, unspecified: Secondary | ICD-10-CM | POA: Diagnosis not present

## 2016-08-09 DIAGNOSIS — J209 Acute bronchitis, unspecified: Secondary | ICD-10-CM

## 2016-08-09 MED ORDER — ALBUTEROL SULFATE HFA 108 (90 BASE) MCG/ACT IN AERS: 2.0000 | INHALATION_SPRAY | Freq: Four times a day (QID) | RESPIRATORY_TRACT | 99 refills | Status: DC | PRN

## 2016-08-09 MED ORDER — FLUTICASONE PROPIONATE 50 MCG/ACT NA SUSP: NASAL | 99 refills | Status: DC

## 2016-08-09 NOTE — Patient Instructions (Addendum)
Glad you are doing so well !  I have updated the Proair rescue inhaler and Flonase in the computer so you can call for refills if needed  Please call if we can help  Vaccination- TDP   In advance of mission trip

## 2016-08-09 NOTE — Progress Notes (Signed)
Patient ID: Brandy [name], female    DOB: 1955-07-14, 61 y.o.   MRN: [id number]  HPI female never smoker followed for allergic rhinitis, asthmatic bronchitis, complicated by GERD history vocal cord paresis/ splint, CHB/AFib/pacemaker ---------------------------------------------------------------------------------------------  08/20/2015-61 year old female never smoker followed for allergic rhinitis, asthmatic bronchitis, complicated by GERD history vocal cord paresis/ splint, CHB/AFib/pacemaker FOLLOWS FOR: increased headaches and nasal drainge due to allergies; Pt states her insurance will not cover Symbicort since Jan 2017-she has went without it since then and states she is doing well with out it. Cough but not bad.  08/09/16- 61 year old female never smoker followed for allergic rhinitis, asthmatic bronchitis, complicated by GERD history vocal cord paresis/ splint, CHB/AFib/pacemaker FOLLOW UP FOR patient is here for a follow up for her asthma and bronchitis she states that she is doing very well,patient has only had to use her inhaler a couple on times Rare need for rescue inhaler and no recent significant exacerbations. Upcoming mission trip-discussed needs.  Review of Systems-see HPI Constitutional:   No-   weight loss, night sweats, fevers, chills, fatigue, lassitude. HEENT:   No-  headaches, difficulty swallowing, tooth/dental problems, sore throat,       No-  sneezing, itching, ear ache,+ mild-nasal congestion, +post nasal drip,  CV:  No-   chest pain, orthopnea, PND, swelling in lower extremities, anasarca, dizziness, palpitations Resp: No-   shortness of breath with exertion or at rest.              No-   productive cough,  no- non-productive cough,  No-  coughing up of blood.              No-   change in color of mucus.  No- wheezing.   Skin: No-   rash or lesions. GI:  No-   heartburn, indigestion, abdominal pain, nausea, vomiting,  GU:  MS:  No-   joint pain or swelling.    Neuro- nothing unusual  Psych:  No- change in mood or affect. No depression or anxiety.  No memory loss.    Objective:   Physical Exam General- Alert, Oriented, Affect-appropriate, Distress- none acute Skin- rash-none, lesions- none, excoriation- none Lymphadenopathy- none Head- atraumatic            Eyes- Gross vision intact, PERRLA, conjunctivae clear secretions            Ears- Hearing, canals normal            Nose- No sniffing, No Septal dev, mucus, polyps, erosion, perforation             Throat- Mallampati II , mucosa -clear, not red , drainage- none, tonsils- atrophic;  +voice quality less hoarse Neck- flexible , trachea midline, no stridor , thyroid nl, carotid no bruit Chest - symmetrical excursion , unlabored           Heart/CV- RRR , no murmur , no gallop  , no rub, nl s1 s2                           - JVD- none , edema- none, stasis changes- none, varices- none           Lung- clear to P&A, wheeze- none, cough- none , dullness-none, rub- none           Chest wall- +L pacemaker Abd-  Br/ Gen/ Rectal- Not done, not indicated Extrem- cyanosis- none, clubbing, none, atrophy- none, strength- nl Neuro-  grossly intact to observation

## 2016-08-12 NOTE — Assessment & Plan Note (Signed)
No recent issues. She has better voice quality at visit today.

## 2016-08-12 NOTE — Assessment & Plan Note (Signed)
Adequate control so far through early spring season using antihistamines.

## 2016-08-12 NOTE — Assessment & Plan Note (Signed)
She hasn't had a significant exacerbation in months but had some concern about being prepared while on [place] trip in [place]. We discussed medications to take.

## 2016-08-12 NOTE — Assessment & Plan Note (Signed)
She says she is stable, continuing management by cardiology

## 2016-08-14 ENCOUNTER — Telehealth: Payer: Self-pay | Admitting: Internal Medicine

## 2016-08-14 NOTE — Telephone Encounter (Signed)
Dr. Rico Ala office requesting last OV notes to be faxed to their office at below verified fax #.  This has been printed and sent.  Nothing further needed.

## 2016-08-24 LAB — HM DEXA SCAN

## 2016-09-19 DIAGNOSIS — H9113 Presbycusis, bilateral: Secondary | ICD-10-CM | POA: Insufficient documentation

## 2016-09-19 DIAGNOSIS — H9313 Tinnitus, bilateral: Secondary | ICD-10-CM | POA: Insufficient documentation

## 2016-09-20 ENCOUNTER — Ambulatory Visit (INDEPENDENT_AMBULATORY_CARE_PROVIDER_SITE_OTHER): Payer: BC Managed Care – PPO | Admitting: *Deleted

## 2016-09-20 DIAGNOSIS — I442 Atrioventricular block, complete: Secondary | ICD-10-CM

## 2016-09-21 NOTE — Progress Notes (Signed)
Remote pacemaker transmission.   

## 2016-09-22 ENCOUNTER — Encounter: Payer: Self-pay | Admitting: Cardiology

## 2016-09-27 LAB — CUP PACEART REMOTE DEVICE CHECK
Battery Impedance: 1172 Ohm
Battery Remaining Longevity: 46 mo
Battery Voltage: 2.78 V
Date Time Interrogation Session: 20180620165547
Implantable Lead Implant Date: 20100505
Implantable Lead Location: 753859
Implantable Lead Location: 753860
Implantable Lead Model: 5076
Implantable Lead Model: 5076
Implantable Pulse Generator Implant Date: 20100505
Lead Channel Pacing Threshold Pulse Width: 0.4 ms
Lead Channel Setting Pacing Amplitude: 2 V
Lead Channel Setting Pacing Amplitude: 2.5 V
Lead Channel Setting Pacing Pulse Width: 0.4 ms
MDC IDC LEAD IMPLANT DT: 20100505
MDC IDC MSMT LEADCHNL RA IMPEDANCE VALUE: 465 Ohm
MDC IDC MSMT LEADCHNL RA PACING THRESHOLD AMPLITUDE: 0.5 V
MDC IDC MSMT LEADCHNL RA PACING THRESHOLD PULSEWIDTH: 0.4 ms
MDC IDC MSMT LEADCHNL RV IMPEDANCE VALUE: 636 Ohm
MDC IDC MSMT LEADCHNL RV PACING THRESHOLD AMPLITUDE: 0.5 V
MDC IDC SET LEADCHNL RV SENSING SENSITIVITY: 2 mV
MDC IDC STAT BRADY AP VP PERCENT: 90 %
MDC IDC STAT BRADY AP VS PERCENT: 0 %
MDC IDC STAT BRADY AS VP PERCENT: 9 %
MDC IDC STAT BRADY AS VS PERCENT: 0 %

## 2016-10-30 ENCOUNTER — Other Ambulatory Visit: Payer: Self-pay | Admitting: Internal Medicine

## 2016-10-30 DIAGNOSIS — Z9109 Other allergy status, other than to drugs and biological substances: Secondary | ICD-10-CM | POA: Insufficient documentation

## 2016-10-30 DIAGNOSIS — I519 Heart disease, unspecified: Secondary | ICD-10-CM | POA: Insufficient documentation

## 2016-12-25 ENCOUNTER — Ambulatory Visit (INDEPENDENT_AMBULATORY_CARE_PROVIDER_SITE_OTHER): Payer: BC Managed Care – PPO | Admitting: *Deleted

## 2016-12-25 ENCOUNTER — Telehealth: Payer: Self-pay | Admitting: Cardiology

## 2016-12-25 DIAGNOSIS — I442 Atrioventricular block, complete: Secondary | ICD-10-CM

## 2016-12-25 NOTE — Telephone Encounter (Signed)
Spoke with pt and reminded pt of remote transmission that is due today. Pt verbalized understanding.   

## 2016-12-26 LAB — CUP PACEART REMOTE DEVICE CHECK
Battery Remaining Longevity: 44 mo
Battery Voltage: 2.77 V
Brady Statistic AS VP Percent: 8 %
Date Time Interrogation Session: 20180924224136
Implantable Lead Implant Date: 20100505
Implantable Lead Location: 753859
Implantable Pulse Generator Implant Date: 20100505
Lead Channel Pacing Threshold Amplitude: 0.375 V
Lead Channel Pacing Threshold Amplitude: 0.625 V
Lead Channel Pacing Threshold Pulse Width: 0.4 ms
Lead Channel Pacing Threshold Pulse Width: 0.4 ms
Lead Channel Setting Pacing Amplitude: 2 V
Lead Channel Setting Pacing Pulse Width: 0.4 ms
Lead Channel Setting Sensing Sensitivity: 2 mV
MDC IDC LEAD IMPLANT DT: 20100505
MDC IDC LEAD LOCATION: 753860
MDC IDC MSMT BATTERY IMPEDANCE: 1277 Ohm
MDC IDC MSMT LEADCHNL RA IMPEDANCE VALUE: 459 Ohm
MDC IDC MSMT LEADCHNL RV IMPEDANCE VALUE: 632 Ohm
MDC IDC SET LEADCHNL RV PACING AMPLITUDE: 2.5 V
MDC IDC STAT BRADY AP VP PERCENT: 91 %
MDC IDC STAT BRADY AP VS PERCENT: 0 %
MDC IDC STAT BRADY AS VS PERCENT: 0 %

## 2016-12-26 NOTE — Progress Notes (Signed)
Remote pacemaker transmission.   

## 2016-12-28 ENCOUNTER — Encounter: Payer: Self-pay | Admitting: Cardiology

## 2017-03-28 ENCOUNTER — Telehealth: Payer: Self-pay | Admitting: Cardiology

## 2017-03-28 ENCOUNTER — Encounter: Payer: BC Managed Care – PPO | Admitting: *Deleted

## 2017-03-28 NOTE — Telephone Encounter (Signed)
LMOVM reminding pt to send remote transmission.   

## 2017-03-29 ENCOUNTER — Encounter: Payer: Self-pay | Admitting: Cardiology

## 2017-06-15 ENCOUNTER — Telehealth: Payer: Self-pay | Admitting: *Deleted

## 2017-06-15 NOTE — Telephone Encounter (Signed)
°  1. Has your device fired? No   2. Is you device beeping? no  3. Are you experiencing draining or swelling at device site? no  4. Are you calling to see if we received your device transmission? no  5. Have you passed out? No   Patient would like to verify if she needs a remote check before appt with Dr. Sallyanne Kuster in April.   Please route to [place]

## 2017-06-15 NOTE — Telephone Encounter (Signed)
Spoke with Brandy [name] and informed her that she did not have a remote transmission scheduled prior to seeing Dr. Loletha Grayer in April Brandy [name] voiced understanding.

## 2017-07-11 ENCOUNTER — Ambulatory Visit: Payer: BC Managed Care – PPO | Admitting: Cardiovascular Disease

## 2017-07-11 ENCOUNTER — Encounter: Payer: Self-pay | Admitting: Cardiovascular Disease

## 2017-07-11 VITALS — BP 118/70 | HR 78 | Ht 67.0 in | Wt 150.6 lb

## 2017-07-11 DIAGNOSIS — I442 Atrioventricular block, complete: Secondary | ICD-10-CM | POA: Diagnosis not present

## 2017-07-11 DIAGNOSIS — Z950 Presence of cardiac pacemaker: Secondary | ICD-10-CM

## 2017-07-11 DIAGNOSIS — R072 Precordial pain: Secondary | ICD-10-CM

## 2017-07-11 DIAGNOSIS — I480 Paroxysmal atrial fibrillation: Secondary | ICD-10-CM | POA: Diagnosis not present

## 2017-07-11 DIAGNOSIS — I472 Ventricular tachycardia, unspecified: Secondary | ICD-10-CM

## 2017-07-11 MED ORDER — METOPROLOL SUCCINATE ER 50 MG PO TB24: 50.0000 mg | ORAL_TABLET | Freq: Every day | ORAL | 3 refills | Status: DC

## 2017-07-11 NOTE — Progress Notes (Signed)
Patient ID: Brandy [name], female   DOB: [birth year], 62 y.o.   MRN: [id number]     Cardiology Office Note    Date:  07/15/2017   ID:  Brandy [name], DOB Apr 13, 1955, MRN [id number]  PCP:  Tamsen Roers, MD  Cardiologist:   Sanda Klein, MD   Chief Complaint  Patient presents with  . Follow-up    pt reports fall 2 weeks ago (bruised ribs left side). husband has new ca dx, feels her symptoms may be r/t stress.    History of Present Illness:  Brandy [name] is a 62 y.o. female with complete heart block who presents for pacemaker follow-up. She also has episodes of asymptomatic ventricular tachycardia (nonsustained) and paroxysmal atrial fibrillation that have been detected by her device.  Unfortunately, her husband Clair Gulling has recurrent lung cancer.  He is receiving chemotherapy.  He is often very tired.  She is planning another mission trip to the [place] in [place] this August.  She is under a lot of emotional stress even though she tries to put on a good face.  She sometimes has chest discomfort at rest.  It never occurs with housework he does not engage in any routine physical exercise.  She had a recent mechanical fall, thankfully without serious injury.  She had some rib pain related to this, but the chest pressure is a different sensation  She has very infrequent and very brief palpitations but denies other cardiovascular complaints. She specifically denies exertional angina or dyspnea and has not expressed syncope, focal neurological events, bleeding problems or claudication. She has had intermittent edema limited to her left lower extremity, pattern unchanged from the past. She has not had fever or chills or skin rashes. She does not have groin lymphadenopathy. She denies abdominal pain, change in bowel pattern, gastrointestinal bleeding, flank pain, hematuria, dysuria, cough, hemoptysis or pleurisy.  Pacemaker interrogation shows normal device function. Current  device was implanted in 2010 and has an expected generator longevity of 3 years. She has 99.7 % ventricular pacing but also has 91 % atrial pacing.  She is pacemaker dependent  Her device to show occasional brief episodes of nonsustained ventricular tachycardia, the longest of which did last for 28 beats.  There has been no recent atrial fibrillation. Lead parameters are excellent. Heart rate histogram distribution appears fair.  Past Medical History:  Diagnosis Date  . Allergic rhinitis   . Asthmatic bronchitis   . Complete heart block ([facility])   . GERD (gastroesophageal reflux disease)   . MRSA (methicillin resistant Staphylococcus aureus) 2013   left buttock  . Nonsustained ventricular tachycardia ([place])   . 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 Vaginitis   . Vocal cord paresis     Past Surgical History:  Procedure Laterality Date  . ACROMIO-CLAVICULAR JOINT REPAIR Left 12/10/2014   Procedure: LEFT CORACOCLAVICULAR RECONSTRUCTION WITH  ZIPLOOP;  Surgeon: Tania Ade, MD;  Location: [facility];  Service: Orthopedics;  Laterality: Left;  . APPENDECTOMY  05-2010  . DILATION AND CURETTAGE OF UTERUS  1978  . HAND SURGERY    . PACEMAKER INSERTION  08/05/08   Medtronic Adapta  . Vocal cord splint  2005    Outpatient Medications Prior to Visit  Medication Sig Dispense Refill  . ALPRAZolam (XANAX) 0.25 MG tablet Take 0.25 mg by mouth at bedtime as needed.    Marland Kitchen aspirin 81 MG tablet Take 81 mg by mouth daily.    Marland Kitchen  benzonatate (TESSALON) 200 MG capsule TAKE 1 CAPSULE BY MOUTH THREE TIMES DAILY AS NEEDED 30 capsule 12  . Calcium Carbonate-Vitamin D (CALCIUM 500 + D) 500-125 MG-UNIT TABS Take 1 tablet by mouth daily.      . cetirizine (ZYRTEC) 10 MG tablet Take 10 mg by mouth daily.      Marland Kitchen FLUoxetine (PROZAC) 20 MG capsule Take 1 capsule by mouth daily.    . fluticasone (FLONASE) 50 MCG/ACT nasal spray USE 1 SPRAY IN EACH NOSTRIL DAILY 16 g 5  .  valACYclovir (VALTREX) 1000 MG tablet Take 1 tablet by mouth daily as needed.    . metoprolol succinate (TOPROL-XL) 25 MG 24 hr tablet Take 1 tablet (25 mg total) by mouth daily. 30 tablet 11  . albuterol (PROAIR HFA) 108 (90 Base) MCG/ACT inhaler Inhale 2 puffs into the lungs every 6 (six) hours as needed for wheezing or shortness of breath. (Patient not taking: Reported on 07/11/2017) 1 Inhaler prn   No facility-administered medications prior to visit.      Allergies:   Erythromycin and Sulfa antibiotics   Social History   Socioeconomic History  . Marital status: Married    Spouse name: Not on file  . Number of children: Not on file  . Years of education: Not on file  . Highest education level: Not on file  Occupational History  . Not on file  Social Needs  . Financial resource strain: Not on file  . Food insecurity:    Worry: Not on file    Inability: Not on file  . Transportation needs:    Medical: Not on file    Non-medical: Not on file  Tobacco Use  . Smoking status: Never Smoker  . Smokeless tobacco: Never Used  Substance and Sexual Activity  . Alcohol use: Yes  . Drug use: Not on file  . Sexual activity: Not on file  Lifestyle  . Physical activity:    Days per week: Not on file    Minutes per session: Not on file  . Stress: Not on file  Relationships  . Social connections:    Talks on phone: Not on file    Gets together: Not on file    Attends religious service: Not on file    Active member of club or organization: Not on file    Attends meetings of clubs or organizations: Not on file    Relationship status: Not on file  Other Topics Concern  . Not on file  Social History Narrative  . Not on file     Family History:  The patient's family history includes Coronary artery disease in her mother; Diabetes in her father; Heart disease in her father; Hypertension in her mother and sister.   ROS:   Please see the history of present illness.    ROS All other  systems reviewed and are negative.   PHYSICAL EXAM:   VS:  BP 118/70   Pulse 78   Ht 5\' 7"  (1.702 m)   Wt 150 lb 9.6 oz (68.3 kg)   BMI 23.59 kg/m     General: Alert, oriented x3, no distress, appears well Head: no evidence of trauma, PERRL, EOMI, no exophtalmos or lid lag, no myxedema, no xanthelasma; normal ears, nose and oropharynx Neck: normal jugular venous pulsations and no hepatojugular reflux; brisk carotid pulses without delay and no carotid bruits Chest: clear to auscultation, no signs of consolidation by percussion or palpation, normal fremitus, symmetrical and full respiratory excursions.  Healthy left subclavian pacemaker site. Cardiovascular: normal position and quality of the apical impulse, regular rhythm, normal first and paradoxically split second heart sounds, no murmurs, rubs or gallops Abdomen: no tenderness or distention, no masses by palpation, no abnormal pulsatility or arterial bruits, normal bowel sounds, no hepatosplenomegaly Extremities: no clubbing, cyanosis or edema; 2+ radial, ulnar and brachial pulses bilaterally; 2+ right femoral, posterior tibial and dorsalis pedis pulses; 2+ left femoral, posterior tibial and dorsalis pedis pulses; no subclavian or femoral bruits Neurological: grossly nonfocal Psych: Normal mood and affect   Wt Readings from Last 3 Encounters:  07/11/17 150 lb 9.6 oz (68.3 kg)  08/09/16 148 lb 3.2 oz (67.2 kg)  06/21/16 149 lb 6.4 oz (67.8 kg)      Studies/Labs Reviewed:   EKG:  EKG is ordered today.  The ekg ordered today demonstrates atrioventricular sequential pacing  Recent Labs: 07/13/2017: BUN 16; Creatinine, Ser 0.91; Potassium 5.4; Sodium 140   Lipid Panel No results found for: CHOL, TRIG, HDL, CHOLHDL, VLDL, LDLCALC, LDLDIRECT    ASSESSMENT:    1. Precordial chest pain   2. VT (ventricular tachycardia) ([facility])   3. CHB (complete heart block) ([facility])   4. Pacemaker   5. Paroxysmal atrial fibrillation ([facility])       PLAN:  In order of problems listed above:  1. Chest pain: The symptoms occur at rest, but is now in her mid 28s and has episodes of VT.  I think it is worth performing a coronary CT angiogram to exclude significant coronary artery disease. 2. NSVT: The burden of ventricular tachycardia has been low and has remained constant over time.  She is on beta-blockers. 3. CHB: Pacemaker dependent.  She should not go through the metal detector at the airport. 4. PPM: Normal device function.  Continue remote downloads every 3 months and yearly office visits. 5. AFib: Her burden of atrial fibrillation is very low and she has not had a significant event in over. CHADSVasc 1 (gender only). Her embolic risk is still very low.   We discussed the fact that the embolic risk is higher as she gets older and will also increase if she develops other cardiac risk factors of structural heart disease.  The time being, ASA remains an appropriate choice   Medication Adjustments/Labs and Tests Ordered: Current medicines are reviewed at length with the patient today.  Concerns regarding medicines are outlined above.  Medication changes, Labs and Tests ordered today are listed in the Patient Instructions below. Patient Instructions  Dr Sallyanne Kuster has recommended making the following medication changes: 1. INCREASE Metoprolol to 50 mg daily  Your physician has requested that you have an echocardiogram. Echocardiography is a painless test that uses sound waves to create images of your heart. It provides your doctor with information about the size and shape of your heart and how well your heart's chambers and valves are working. This procedure takes approximately one hour. There are no restrictions for this procedure. >>This will be performed at our [facility] location [place], [place] [address] [phone]  Your physician has requested that you have cardiac CT. Cardiac computed tomography (CT) is a  painless test that uses an x-ray machine to take clear, detailed pictures of your heart. For further information please visit [facility]ugeFiesta.tn. Please follow instruction sheet as given. >>This will be performed at [facility] will be called to schedule once we receive insurance approval.  Remote monitoring is used to monitor your  Pacemaker or ICD from home. This monitoring reduces the number of office visits required to check your device to one time per year. It allows [place] to keep an eye on the functioning of your device to ensure it is working properly. You are scheduled for a device check from home on Wednesday, July 10th, 2019. You may send your transmission at any time that day. If you have a wireless device, the transmission will be sent automatically. After your physician reviews your transmission, you will receive a notification with your next transmission date.  Dr Sallyanne Kuster recommends that you schedule a follow-up appointment in 12 months with a pacemaker check. You will receive a reminder letter in the mail two months in advance. If you don't receive a letter, please call our office to schedule the follow-up appointment.  If you need a refill on your cardiac medications before your next appointment, please call your pharmacy.      Signed, Sanda Klein, MD  07/15/2017 [facility] [place], [place], [place]  [address] Phone: [phone]; Fax: [phone]

## 2017-07-11 NOTE — Patient Instructions (Signed)
Dr Sallyanne Kuster has recommended making the following medication changes: 1. INCREASE Metoprolol to 50 mg daily  Your physician has requested that you have an echocardiogram. Echocardiography is a painless test that uses sound waves to create images of your heart. It provides your doctor with information about the size and shape of your heart and how well your heart's chambers and valves are working. This procedure takes approximately one hour. There are no restrictions for this procedure. >>This will be performed at our [facility] location [place], [place] [address] [phone]  Your physician has requested that you have cardiac CT. Cardiac computed tomography (CT) is a painless test that uses an x-ray machine to take clear, detailed pictures of your heart. For further information please visit [facility]ugeFiesta[facility]tn. Please follow instruction sheet as given. >>This will be performed at [facility] will be called to schedule once we receive insurance approval.  Remote monitoring is used to monitor your Pacemaker or ICD from home. This monitoring reduces the number of office visits required to check your device to one time per year. It allows [place] to keep an eye on the functioning of your device to ensure it is working properly. You are scheduled for a device check from home on Wednesday, July 10th, 2019. You may send your transmission at any time that day. If you have a wireless device, the transmission will be sent automatically. After your physician reviews your transmission, you will receive a notification with your next transmission date.  Dr Sallyanne Kuster recommends that you schedule a follow-up appointment in 12 months with a pacemaker check. You will receive a reminder letter in the mail two months in advance. If you don't receive a letter, please call our office to schedule the follow-up appointment.  If you need a refill on your cardiac medications before your next  appointment, please call your pharmacy.

## 2017-07-13 LAB — BASIC METABOLIC PANEL
BUN / CREAT RATIO: 18 (ref 12–28)
BUN: 16 mg/dL (ref 8–27)
CO2: 26 mmol/L (ref 20–29)
CREATININE: 0.91 mg/dL (ref 0.57–1.00)
Calcium: 9.5 mg/dL (ref 8.7–10.3)
Chloride: 102 mmol/L (ref 96–106)
GFR calc Af Amer: 78 mL/min/{1.73_m2} (ref >59)
GFR calc non Af Amer: 67 mL/min/{1.73_m2} (ref >59)
GLUCOSE: 82 mg/dL (ref 65–99)
POTASSIUM: 5.4 mmol/L — AB (ref 3.5–5.2)
Sodium: 140 mmol/L (ref 134–144)

## 2017-07-20 ENCOUNTER — Telehealth: Payer: Self-pay

## 2017-07-20 DIAGNOSIS — E875 Hyperkalemia: Secondary | ICD-10-CM

## 2017-07-20 NOTE — Telephone Encounter (Signed)
-----   Message from Sanda Klein, MD sent at 07/13/2017  9:07 PM EDT ----- K a little high, please repeat K level on day of procedure. Suspect it is artifactual. MCr

## 2017-07-20 NOTE — Telephone Encounter (Signed)
Patient returned call for results. Results reviewed. Patient verbalized understanding and agreed with plan. Once coronary CT is scheduled, she has been advised to have her blood work here in the office, to which patient agreed.   Message sent to admin for update on CT scheduling.  Repeat lab ordered.  Patient will be out of town next week and requested to be contacted on her cell if necessary, [phone].

## 2017-07-24 ENCOUNTER — Other Ambulatory Visit (HOSPITAL_COMMUNITY): Payer: BC Managed Care – PPO

## 2017-07-31 ENCOUNTER — Other Ambulatory Visit: Payer: Self-pay

## 2017-07-31 ENCOUNTER — Telehealth: Payer: Self-pay

## 2017-07-31 ENCOUNTER — Ambulatory Visit (HOSPITAL_COMMUNITY): Payer: BC Managed Care – PPO | Attending: Cardiovascular Disease

## 2017-07-31 DIAGNOSIS — I472 Ventricular tachycardia, unspecified: Secondary | ICD-10-CM

## 2017-07-31 DIAGNOSIS — I420 Dilated cardiomyopathy: Secondary | ICD-10-CM | POA: Insufficient documentation

## 2017-07-31 DIAGNOSIS — R072 Precordial pain: Secondary | ICD-10-CM

## 2017-07-31 DIAGNOSIS — I5030 Unspecified diastolic (congestive) heart failure: Secondary | ICD-10-CM | POA: Diagnosis not present

## 2017-07-31 DIAGNOSIS — I081 Rheumatic disorders of both mitral and tricuspid valves: Secondary | ICD-10-CM | POA: Diagnosis not present

## 2017-07-31 MED ORDER — FUROSEMIDE 20 MG PO TABS: 20.0000 mg | ORAL_TABLET | Freq: Every day | ORAL | 3 refills | Status: DC

## 2017-07-31 NOTE — Telephone Encounter (Signed)
-----   Message from Sanda Klein, MD sent at 07/31/2017  2:10 PM EDT ----- Reassuring echo. Heart pumping function remains normal. There are some borderline signs of increased fluid build-up. Would suggest a trial of diuretic 20 mg daily and see if that helps with dyspnea. MCr

## 2017-07-31 NOTE — Telephone Encounter (Signed)
Called patient with results. Patient verbalized understanding. Rx(s) sent to pharmacy electronically.

## 2017-08-24 LAB — BASIC METABOLIC PANEL
BUN / CREAT RATIO: 18 (ref 12–28)
BUN: 16 mg/dL (ref 8–27)
CO2: 25 mmol/L (ref 20–29)
Calcium: 9.4 mg/dL (ref 8.7–10.3)
Chloride: 102 mmol/L (ref 96–106)
Creatinine, Ser: 0.9 mg/dL (ref 0.57–1.00)
GFR calc Af Amer: 79 mL/min/{1.73_m2} (ref >59)
GFR, EST NON AFRICAN AMERICAN: 68 mL/min/{1.73_m2} (ref >59)
Glucose: 76 mg/dL (ref 65–99)
Potassium: 4.9 mmol/L (ref 3.5–5.2)
SODIUM: 144 mmol/L (ref 134–144)

## 2017-08-28 LAB — HM MAMMOGRAPHY

## 2017-09-04 ENCOUNTER — Telehealth: Payer: Self-pay | Admitting: Cardiovascular Disease

## 2017-09-04 ENCOUNTER — Ambulatory Visit (HOSPITAL_COMMUNITY)
Admission: RE | Admit: 2017-09-04 | Discharge: 2017-09-04 | Disposition: A | Discharge status: Home / Self Care | Payer: BC Managed Care – PPO | Source: Ambulatory Visit | Attending: Cardiovascular Disease | Admitting: Cardiovascular Disease

## 2017-09-04 ENCOUNTER — Encounter (HOSPITAL_COMMUNITY): Payer: Self-pay

## 2017-09-04 ENCOUNTER — Ambulatory Visit (HOSPITAL_COMMUNITY): Payer: BC Managed Care – PPO

## 2017-09-04 DIAGNOSIS — R072 Precordial pain: Secondary | ICD-10-CM

## 2017-09-04 DIAGNOSIS — I472 Ventricular tachycardia, unspecified: Secondary | ICD-10-CM

## 2017-09-04 NOTE — Telephone Encounter (Signed)
Spoke to patient, patient states she went for her coronary CTA this AM and they were unable to do it because of her HR.   She states they informed her that someone will have to be present at the scan to decrease her pacemaker rate.  She states she is set 60-100 but they need someone there to decrease the rate for the scan and then increase it back.    Advised I would sent to primary nurse to address.

## 2017-09-04 NOTE — Progress Notes (Signed)
Dr Aundra Dubin contacted pt pacing heart rate in the 80's.  MD states to have pt contact her Cardiologist office, office will determine if rate can be lowered to rate where test can be preformed.

## 2017-09-04 NOTE — Telephone Encounter (Signed)
Pt calling   Pt had an appt for a CT this morning and they couldn't do the test because the way her pacermaker was set it needed to be reset and lowered and nobody was there to do that. Please advise pt

## 2017-10-03 ENCOUNTER — Ambulatory Visit (HOSPITAL_COMMUNITY)
Admission: RE | Admit: 2017-10-03 | Discharge: 2017-10-03 | Disposition: A | Discharge status: Home / Self Care | Payer: BC Managed Care – PPO | Source: Ambulatory Visit | Attending: Cardiovascular Disease | Admitting: Cardiovascular Disease

## 2017-10-03 DIAGNOSIS — R079 Chest pain, unspecified: Secondary | ICD-10-CM | POA: Diagnosis not present

## 2017-10-03 DIAGNOSIS — R072 Precordial pain: Secondary | ICD-10-CM | POA: Diagnosis present

## 2017-10-03 DIAGNOSIS — I472 Ventricular tachycardia: Secondary | ICD-10-CM | POA: Diagnosis present

## 2017-10-03 MED ORDER — IOPAMIDOL (ISOVUE-370) INJECTION 76%
INTRAVENOUS | Status: AC
  Administered 2017-10-03: 80 mL
  Filled 2017-10-03: qty 100

## 2017-10-03 MED ORDER — NITROGLYCERIN 0.4 MG SL SUBL
0.8000 mg | SUBLINGUAL_TABLET | Freq: Once | SUBLINGUAL | Status: AC
  Administered 2017-10-03: 0.8 mg via SUBLINGUAL
  Filled 2017-10-03: qty 25

## 2017-10-03 MED ORDER — NITROGLYCERIN 0.4 MG SL SUBL
SUBLINGUAL_TABLET | SUBLINGUAL | Status: AC
  Filled 2017-10-03: qty 2

## 2017-10-03 NOTE — Progress Notes (Signed)
Phone call made to [employer] Guardian rep. for pacemaker needs. She states that she was not aware of the pt but can come over however she is in the office so the test may be delayed. Informed pt, she verbalized understanding.

## 2017-10-05 LAB — CUP PACEART INCLINIC DEVICE CHECK
Date Time Interrogation Session: 20190705145359
Implantable Lead Implant Date: 20100505
Implantable Lead Location: 753859
Implantable Lead Model: 5076
Implantable Pulse Generator Implant Date: 20100505
Lead Channel Setting Pacing Pulse Width: 0.4 ms
Lead Channel Setting Sensing Sensitivity: 2 mV
MDC IDC LEAD IMPLANT DT: 20100505
MDC IDC LEAD LOCATION: 753860
MDC IDC SET LEADCHNL RA PACING AMPLITUDE: 2 V
MDC IDC SET LEADCHNL RV PACING AMPLITUDE: 2.5 V

## 2017-10-10 ENCOUNTER — Telehealth: Payer: Self-pay | Admitting: Cardiology

## 2017-10-10 ENCOUNTER — Ambulatory Visit (INDEPENDENT_AMBULATORY_CARE_PROVIDER_SITE_OTHER): Payer: BC Managed Care – PPO | Admitting: *Deleted

## 2017-10-10 DIAGNOSIS — I442 Atrioventricular block, complete: Secondary | ICD-10-CM

## 2017-10-10 NOTE — Telephone Encounter (Signed)
LMOVM reminding pt to send remote transmission.   

## 2017-10-11 NOTE — Progress Notes (Signed)
Remote pacemaker transmission.   

## 2017-11-08 LAB — CUP PACEART REMOTE DEVICE CHECK
Battery Impedance: 1691 Ohm
Battery Remaining Longevity: 34 mo
Battery Voltage: 2.76 V
Brady Statistic AP VP Percent: 95 %
Implantable Lead Implant Date: 20100505
Implantable Lead Location: 753860
Implantable Lead Model: 5076
Implantable Lead Model: 5076
Implantable Pulse Generator Implant Date: 20100505
Lead Channel Impedance Value: 442 Ohm
Lead Channel Pacing Threshold Amplitude: 0.5 V
Lead Channel Setting Pacing Amplitude: 2 V
Lead Channel Setting Pacing Amplitude: 2.5 V
Lead Channel Setting Pacing Pulse Width: 0.4 ms
Lead Channel Setting Sensing Sensitivity: 2 mV
MDC IDC LEAD IMPLANT DT: 20100505
MDC IDC LEAD LOCATION: 753859
MDC IDC MSMT LEADCHNL RA PACING THRESHOLD PULSEWIDTH: 0.4 ms
MDC IDC MSMT LEADCHNL RV IMPEDANCE VALUE: 626 Ohm
MDC IDC MSMT LEADCHNL RV PACING THRESHOLD AMPLITUDE: 0.625 V
MDC IDC MSMT LEADCHNL RV PACING THRESHOLD PULSEWIDTH: 0.4 ms
MDC IDC SESS DTM: 20190710222213
MDC IDC STAT BRADY AP VS PERCENT: 0 %
MDC IDC STAT BRADY AS VP PERCENT: 5 %
MDC IDC STAT BRADY AS VS PERCENT: 0 %

## 2017-11-19 ENCOUNTER — Other Ambulatory Visit: Payer: Self-pay | Admitting: Cardiovascular Disease

## 2017-12-18 LAB — HM PAP SMEAR: HM PAP: NEGATIVE

## 2018-01-09 ENCOUNTER — Ambulatory Visit (INDEPENDENT_AMBULATORY_CARE_PROVIDER_SITE_OTHER): Payer: BC Managed Care – PPO | Admitting: *Deleted

## 2018-01-09 DIAGNOSIS — I442 Atrioventricular block, complete: Secondary | ICD-10-CM | POA: Diagnosis not present

## 2018-01-10 NOTE — Progress Notes (Signed)
Remote pacemaker transmission.   

## 2018-02-19 LAB — CUP PACEART REMOTE DEVICE CHECK
Battery Remaining Longevity: 32 mo
Brady Statistic AP VP Percent: 90 %
Brady Statistic AS VP Percent: 10 %
Brady Statistic AS VS Percent: 0 %
Implantable Lead Implant Date: 20100505
Implantable Lead Implant Date: 20100505
Implantable Lead Location: 753859
Implantable Pulse Generator Implant Date: 20100505
Lead Channel Impedance Value: 501 Ohm
Lead Channel Impedance Value: 671 Ohm
Lead Channel Pacing Threshold Amplitude: 0.5 V
Lead Channel Pacing Threshold Amplitude: 0.625 V
Lead Channel Pacing Threshold Pulse Width: 0.4 ms
Lead Channel Setting Pacing Amplitude: 2 V
Lead Channel Setting Sensing Sensitivity: 2 mV
MDC IDC LEAD LOCATION: 753860
MDC IDC MSMT BATTERY IMPEDANCE: 1858 Ohm
MDC IDC MSMT BATTERY VOLTAGE: 2.76 V
MDC IDC MSMT LEADCHNL RA PACING THRESHOLD PULSEWIDTH: 0.4 ms
MDC IDC SESS DTM: 20191009162423
MDC IDC SET LEADCHNL RV PACING AMPLITUDE: 2.5 V
MDC IDC SET LEADCHNL RV PACING PULSEWIDTH: 0.4 ms
MDC IDC STAT BRADY AP VS PERCENT: 0 %

## 2018-02-20 NOTE — Progress Notes (Signed)
Subjective:    Patient ID: Brandy [name], female    DOB: [birth year], 62 y.o.   MRN: [id number]  HPI:  Brandy [name] is here to establish as a new pt.  She is a pleasant 62 year old female. PMH:HTN, CHB treated with Medtronic Dual Chamber PPM inserted 2010 Depression that has been worsening lately r/t family stressor's (husband's lung ca returned, helping home school grandchildren, several close friends failing health). NSVT- currently on Metoprolol 50mg  QD which was increased in April 2019 by cardiology/Dr. Sallyanne Kuster Acute anxiety treated with alprazolam 0.25mg , she reports taking 1-2 tablets every 2 weeks and has been using for >20 years without SE She reports sig increase in fatigue since Jan due to caring for her husband, he requires daily lovenox injections and "port procedures" twice weekly. She is followed by Dr. Sallyanne Kuster - currently on Metoprolol 50mg  QD (which he increased in April 2019) and Furosemide 20mg  QD She walks 30 mins 2-3 times/week She drinks water, hot tea and follows a heart health diet She abstains from tobacco/vape/ETOH use She reports her sister and father both also requiring PPMs for heart blocks  Patient Care Team    Relationship Specialty Notifications Start End  Brandy Grandchild, NP PCP - General Family Medicine  02/21/18     Patient Active Problem List   Diagnosis Date Noted  . Healthcare maintenance 02/21/2018  . Depression, major, recurrent, moderate ([place]) 02/21/2018  . Paroxysmal atrial fibrillation ([place]) 06/23/2016  . Closed fracture of distal clavicle 12/10/2014  . CHB (complete heart block) ([facility]) 06/09/2013  . Pacemaker - Medtronic dual-chamber implanted 2010 06/09/2013  . NSVT (nonsustained ventricular tachycardia) ([place]) 06/09/2013  . Acute laryngitis and tracheitis 12/15/2012  . Acute upper respiratory infections of unspecified site 06/10/2012  . Anxiety attack 06/12/2011  . RHINOSINUSITIS, ACUTE 12/28/2009  . CANDIDIASIS OF THE ESOPHAGUS  10/02/2007  . VAGINITIS, RECURRENT 10/02/2007  . VAGINAL DISCHARGE 10/02/2007  . ASTHMATIC BRONCHITIS, ACUTE 06/05/2007  . Seasonal allergic rhinitis 06/05/2007  . Vocal cord paralysis 06/05/2007  . Esophageal reflux 06/05/2007     Past Medical History:  Diagnosis Date  . Allergic rhinitis   . Asthmatic bronchitis   . Complete heart block ([place])   . GERD (gastroesophageal reflux disease)   . MRSA (methicillin resistant Staphylococcus aureus) 2013   left buttock  . Nonsustained ventricular tachycardia ([facility])   . 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 Vaginitis   . Vocal cord paresis      Past Surgical History:  Procedure Laterality Date  . ACROMIO-CLAVICULAR JOINT REPAIR Left 12/10/2014   Procedure: LEFT CORACOCLAVICULAR RECONSTRUCTION WITH  ZIPLOOP;  Surgeon: Tania Ade, MD;  Location: [facility];  Service: Orthopedics;  Laterality: Left;  . APPENDECTOMY  05-2010  . DILATION AND CURETTAGE OF UTERUS  1978  . HAND SURGERY    . PACEMAKER INSERTION  08/05/08   Medtronic Adapta  . Vocal cord splint  2005     Family History  Problem Relation Age of Onset  . Hypertension Mother   . Coronary artery disease Mother   . Depression Mother   . Heart disease Mother   . Diabetes Father   . Heart disease Father   . Depression Father   . Hypertension Father   . Hypertension Sister   . Alcohol abuse Sister   . Depression Sister   . Diabetes Paternal Uncle   . Cancer Maternal Grandmother  colon  . Heart disease Maternal Grandfather   . Diabetes Maternal Grandfather   . Alcohol abuse Paternal Grandfather   . Heart disease Paternal Grandfather      Social History   Substance and Sexual Activity  Drug Use Never     Social History   Substance and Sexual Activity  Alcohol Use Not Currently     Social History   Tobacco Use  Smoking Status Never Smoker  Smokeless Tobacco Never Used     Outpatient Encounter  Medications as of 02/21/2018  Medication Sig  . ALPRAZolam (XANAX) 0.25 MG tablet Take 0.25 mg by mouth at bedtime as needed.  Marland Kitchen aspirin 81 MG tablet Take 81 mg by mouth daily.  . Calcium Carbonate-Vitamin D (CALCIUM 500 + D) 500-125 MG-UNIT TABS Take 1 tablet by mouth daily.    Marland Kitchen FLUoxetine (PROZAC) 20 MG capsule Take 1 capsule by mouth daily.  . fluticasone (FLONASE) 50 MCG/ACT nasal spray USE 1 SPRAY IN EACH NOSTRIL DAILY  . furosemide (LASIX) 20 MG tablet TAKE 1 TABLET BY MOUTH DAILY  . metoprolol succinate (TOPROL-XL) 50 MG 24 hr tablet Take 1 tablet (50 mg total) by mouth daily.  . valACYclovir (VALTREX) 1000 MG tablet Take 1 tablet by mouth daily as needed.  . [DISCONTINUED] benzonatate (TESSALON) 200 MG capsule TAKE 1 CAPSULE BY MOUTH THREE TIMES DAILY AS NEEDED  . [DISCONTINUED] cetirizine (ZYRTEC) 10 MG tablet Take 10 mg by mouth daily.     No facility-administered encounter medications on file as of 02/21/2018.     Allergies: Erythromycin and Sulfa antibiotics  Body mass index is 24.2 kg/m.  Blood pressure 120/75, pulse 89, temperature 98.2 F (36.8 C), temperature source Oral, height 5' 6.25" (1.683 m), weight 151 lb 1.6 oz (68.5 kg), SpO2 97 %.  Review of Systems  Constitutional: Positive for fatigue. Negative for activity change, appetite change, chills, diaphoresis, fever and unexpected weight change.  Respiratory: Negative for cough, chest tightness, shortness of breath, wheezing and stridor.   Cardiovascular: Negative for chest pain, palpitations and leg swelling.  Gastrointestinal: Negative for abdominal distention, abdominal pain, blood in stool, constipation, diarrhea, nausea and vomiting.  Endocrine: Negative for cold intolerance, heat intolerance, polydipsia, polyphagia and polyuria.  Genitourinary: Negative for difficulty urinating and dysuria.  Musculoskeletal: Negative for arthralgias, back pain, gait problem, joint swelling, myalgias, neck pain and neck  stiffness.  Neurological: Negative for dizziness and headaches.  Hematological: Does not bruise/bleed easily.  Psychiatric/Behavioral: Positive for dysphoric mood and sleep disturbance. Negative for agitation, behavioral problems, confusion, decreased concentration, hallucinations, self-injury and suicidal ideas. The patient is nervous/anxious. The patient is not hyperactive.        Objective:   Physical Exam  Constitutional: She is oriented to person, place, and time. She appears well-developed and well-nourished. No distress.  HENT:  Head: Normocephalic and atraumatic.  Right Ear: External ear normal.  Left Ear: External ear normal.  Nose: Nose normal.  Mouth/Throat: Oropharynx is clear and moist.  Eyes: Pupils are equal, round, and reactive to light. Conjunctivae and EOM are normal.  Cardiovascular: Normal rate, regular rhythm, normal heart sounds and intact distal pulses.  No murmur heard. Pulmonary/Chest: Effort normal and breath sounds normal. No stridor. No respiratory distress. She has no wheezes. She has no rales. She exhibits no tenderness.  Neurological: She is alert and oriented to person, place, and time.  Skin: Skin is warm and dry. Capillary refill takes less than 2 seconds. No rash noted. She is not diaphoretic.  No erythema. No pallor.  Psychiatric: She has a normal mood and affect. Her speech is normal and behavior is normal. Judgment and thought content normal. Cognition and memory are normal.  Well groomed Tearful at times when speaking about her husband's illness or her sick friends   Nursing note and vitals reviewed.     Assessment & Plan:   1. Healthcare maintenance   2. Need for zoster vaccination   3. Depression, major, recurrent, moderate ([place])   4. CHB (complete heart block) ([place])     Healthcare maintenance Continue all medications as directed, Continue to stay well hydrated and follow Mediterranean diet. Continue regular exercise. Continue with the  Education officer, museum at the [employer] to address depression/axiety. Please schedule complete physical in 4 weeks, please schedule fasting lab app the week prior.  Depression, major, recurrent, moderate ([place]) She declined referral to [facility]. Continue with the Education officer, museum at the [employer] to address depression/axiety.   CHB (complete heart block) Medtronic Dual Chamber Implanted 2010 Rate today 89    FOLLOW-UP:  Return in about 4 weeks (around 03/21/2018) for CPE, Fasting Labs.

## 2018-02-21 ENCOUNTER — Encounter: Payer: Self-pay | Admitting: Adult Health

## 2018-02-21 ENCOUNTER — Ambulatory Visit (INDEPENDENT_AMBULATORY_CARE_PROVIDER_SITE_OTHER): Payer: BC Managed Care – PPO | Admitting: Adult Health

## 2018-02-21 VITALS — BP 120/75 | HR 89 | Temp 98.2°F | Ht 66.25 in | Wt 151.1 lb

## 2018-02-21 DIAGNOSIS — I442 Atrioventricular block, complete: Secondary | ICD-10-CM | POA: Diagnosis not present

## 2018-02-21 DIAGNOSIS — F331 Major depressive disorder, recurrent, moderate: Secondary | ICD-10-CM

## 2018-02-21 DIAGNOSIS — Z0000 Encounter for general adult medical examination without abnormal findings: Secondary | ICD-10-CM

## 2018-02-21 DIAGNOSIS — I472 Ventricular tachycardia: Secondary | ICD-10-CM | POA: Diagnosis not present

## 2018-02-21 DIAGNOSIS — Z23 Encounter for immunization: Secondary | ICD-10-CM

## 2018-02-21 DIAGNOSIS — I4729 Other ventricular tachycardia: Secondary | ICD-10-CM

## 2018-02-21 NOTE — Assessment & Plan Note (Signed)
Continue all medications as directed, Continue to stay well hydrated and follow Mediterranean diet. Continue regular exercise. Continue with the Education officer, museum at the [employer] to address depression/axiety. Please schedule complete physical in 4 weeks, please schedule fasting lab app the week prior.

## 2018-02-21 NOTE — Assessment & Plan Note (Signed)
Medtronic Dual Chamber Implanted 2010 Rate today 89

## 2018-02-21 NOTE — Assessment & Plan Note (Signed)
She declined referral to [facility]. Continue with the Education officer, museum at the [employer] to address depression/axiety.

## 2018-02-21 NOTE — Patient Instructions (Signed)
Mediterranean Diet A Mediterranean diet refers to food and lifestyle choices that are based on the traditions of countries located on the [place] Sea. This way of eating has been shown to help prevent certain conditions and improve outcomes for people who have chronic diseases, like kidney disease and heart disease. What are tips for following this plan? Lifestyle  Cook and eat meals together with your family, when possible.  Drink enough fluid to keep your urine clear or pale yellow.  Be physically active every day. This includes: ? Aerobic exercise like running or swimming. ? Leisure activities like gardening, walking, or housework.  Get 7-8 hours of sleep each night.  If recommended by your health care provider, drink red wine in moderation. This means 1 glass a day for nonpregnant women and 2 glasses a day for men. A glass of wine equals 5 oz (150 mL). Reading food labels  Check the serving size of packaged foods. For foods such as rice and pasta, the serving size refers to the amount of cooked product, not dry.  Check the total fat in packaged foods. Avoid foods that have saturated fat or trans fats.  Check the ingredients list for added sugars, such as corn syrup. Shopping  At the grocery store, buy most of your food from the areas near the walls of the store. This includes: ? Fresh fruits and vegetables (produce). ? Grains, beans, nuts, and seeds. Some of these may be available in unpackaged forms or large amounts (in bulk). ? Fresh seafood. ? Poultry and eggs. ? Low-fat dairy products.  Buy whole ingredients instead of prepackaged foods.  Buy fresh fruits and vegetables in-season from local farmers markets.  Buy frozen fruits and vegetables in resealable bags.  If you do not have access to quality fresh seafood, buy precooked frozen shrimp or canned fish, such as tuna, salmon, or sardines.  Buy small amounts of raw or cooked vegetables, salads, or olives from the  deli or salad bar at your store.  Stock your pantry so you always have certain foods on hand, such as olive oil, canned tuna, canned tomatoes, rice, pasta, and beans. Cooking  Cook foods with extra-virgin olive oil instead of using butter or other vegetable oils.  Have meat as a side dish, and have vegetables or grains as your main dish. This means having meat in small portions or adding small amounts of meat to foods like pasta or stew.  Use beans or vegetables instead of meat in common dishes like chili or lasagna.  Experiment with different cooking methods. Try roasting or broiling vegetables instead of steaming or sauteing them.  Add frozen vegetables to soups, stews, pasta, or rice.  Add nuts or seeds for added healthy fat at each meal. You can add these to yogurt, salads, or vegetable dishes.  Marinate fish or vegetables using olive oil, lemon juice, garlic, and fresh herbs. Meal planning  Plan to eat 1 vegetarian meal one day each week. Try to work up to 2 vegetarian meals, if possible.  Eat seafood 2 or more times a week.  Have healthy snacks readily available, such as: ? Vegetable sticks with hummus. ? Greek yogurt. ? Fruit and nut trail mix.  Eat balanced meals throughout the week. This includes: ? Fruit: 2-3 servings a day ? Vegetables: 4-5 servings a day ? Low-fat dairy: 2 servings a day ? Fish, poultry, or lean meat: 1 serving a day ? Beans and legumes: 2 or more servings a week ? Nuts   and seeds: 1-2 servings a day ? Whole grains: 6-8 servings a day ? Extra-virgin olive oil: 3-4 servings a day  Limit red meat and sweets to only a few servings a month What are my food choices?  Mediterranean diet ? Recommended ? Grains: Whole-grain pasta. Brown rice. Bulgar wheat. Polenta. Couscous. Whole-wheat bread. [place]. ? Vegetables: Artichokes. Beets. Broccoli. Cabbage. Carrots. Eggplant. Green beans. Chard. Kale. Spinach. Onions. Leeks. Peas. Squash.  Tomatoes. Peppers. Radishes. ? Fruits: Apples. Apricots. Avocado. Berries. Bananas. Cherries. Dates. Figs. Grapes. Lemons. Melon. Oranges. Peaches. Plums. Pomegranate. ? Meats and other protein foods: Beans. Almonds. Sunflower seeds. Pine nuts. Peanuts. [place]. Salmon. Scallops. Shrimp. [place]. Tilapia. Clams. Oysters. Eggs. ? Dairy: Low-fat milk. Cheese. Greek yogurt. ? Beverages: Water. Red wine. Herbal tea. ? Fats and oils: Extra virgin olive oil. Avocado oil. Grape seed oil. ? Sweets and desserts: [place] yogurt with honey. Baked apples. Poached pears. Trail mix. ? Seasoning and other foods: Basil. Cilantro. Coriander. Cumin. Mint. Parsley. Sage. Rosemary. Tarragon. Garlic. Oregano. Thyme. Pepper. Balsalmic vinegar. Tahini. Hummus. Tomato sauce. Olives. Mushrooms. ? Limit these ? Grains: Prepackaged pasta or rice dishes. Prepackaged cereal with added sugar. ? Vegetables: Deep fried potatoes (french fries). ? Fruits: Fruit canned in syrup. ? Meats and other protein foods: Beef. Pork. Lamb. Poultry with skin. Hot dogs. Berniece Salines. ? Dairy: Ice cream. Sour cream. Whole milk. ? Beverages: Juice. Sugar-sweetened soft drinks. Beer. Liquor and spirits. ? Fats and oils: Butter. Canola oil. Vegetable oil. Beef fat (tallow). Lard. ? Sweets and desserts: Cookies. Cakes. Pies. Candy. ? Seasoning and other foods: Mayonnaise. Premade sauces and marinades. ? The items listed may not be a complete list. Talk with your dietitian about what dietary choices are right for you. Summary  The Mediterranean diet includes both food and lifestyle choices.  Eat a variety of fresh fruits and vegetables, beans, nuts, seeds, and whole grains.  Limit the amount of red meat and sweets that you eat.  Talk with your health care provider about whether it is safe for you to drink red wine in moderation. This means 1 glass a day for nonpregnant women and 2 glasses a day for men. A glass of wine equals 5 oz (150 mL). This information  is not intended to replace advice given to you by your health care provider. Make sure you discuss any questions you have with your health care provider. Document Released: 11/11/2015 Document Revised: 12/14/2015 Document Reviewed: 11/11/2015 Elsevier Interactive Patient Education  2018 [place] all medications as directed, Continue to stay well hydrated and follow Mediterranean diet. Continue regular exercise. Continue with the Education officer, museum at the [employer] to address depression/axiety. Please schedule complete physical in 4 weeks, please schedule fasting lab app the week prior. WELCOME TO THE PRACTICE!

## 2018-02-21 NOTE — Assessment & Plan Note (Signed)
Currently on Metoprolol 50mg  QD -which he increased in April 2019 by cards/Dr. Sallyanne Kuster

## 2018-02-25 ENCOUNTER — Encounter: Payer: Self-pay | Admitting: Adult Health

## 2018-03-28 ENCOUNTER — Other Ambulatory Visit (INDEPENDENT_AMBULATORY_CARE_PROVIDER_SITE_OTHER): Payer: BC Managed Care – PPO

## 2018-03-28 DIAGNOSIS — Z0000 Encounter for general adult medical examination without abnormal findings: Secondary | ICD-10-CM

## 2018-03-28 NOTE — Progress Notes (Signed)
Subjective:    Patient ID: Brandy [name], female    DOB: 01/08/1956, 62 y.o.   MRN: [id number]  HPI:  02/21/18 OV: Brandy [name] is here to establish as a new pt.  She is a pleasant 62 year old female. PMH:HTN, CHB treated with Medtronic Dual Chamber PPM inserted 2010 Depression that has been worsening lately r/t family stressor's (husband's lung ca returned, helping home school grandchildren, several close friends failing health). NSVT- currently on Metoprolol 50mg  QD which was increased in April 2019 by cardiology/Dr. Sallyanne Kuster Acute anxiety treated with alprazolam 0.25mg , she reports taking 1-2 tablets every 2 weeks and has been using for >20 years without SE She reports sig increase in fatigue since Jan due to caring for her husband, he requires daily lovenox injections and "port procedures" twice weekly. She is followed by Dr. Sallyanne Kuster - currently on Metoprolol 50mg  QD (which he increased in April 2019) and Furosemide 20mg  QD She walks 30 mins 2-3 times/week She drinks water, hot tea and follows a heart health diet She abstains from tobacco/vape/ETOH use She reports her sister and father both also requiring PPMs for heart blocks   04/01/18 OV: Brandy [name] presents for CPE She reports remaining active- walking, house/yard work She continues to struggle with early morning waking- been an issue for years Reviewed recent labs- A1c-5.9 The 10-year ASCVD risk score Mikey Bussing DC Brooke Bonito., et al., 2013) is: 5.6%   Values used to calculate the score:     Age: 40 years     Sex: Female     Is Non-Hispanic African American: No     Diabetic: No     Tobacco smoker: No     Systolic Blood Pressure: 606 mmHg     Is BP treated: No     HDL Cholesterol: 61 mg/dL     Total Cholesterol: 215 mg/dL  LDL-134 Prefers to improve diet and increase regular exercise to normalize numbers- will re-check in 4 months  Healthcare Maintenance: PAP-UTD, 12/2017, last normal Mammogram-UTD, 08/2017, last  normal Immunizations-needs Zoster  AAA Screening-N/A LDCT-N/A  Patient Care Team    Relationship Specialty Notifications Start End  [place] Grandchild, NP PCP - General Family Medicine  02/21/18   Gastroenterology, [facility]  Gastroenterology  02/25/18   Lady Gary, [facility]    02/25/18     Patient Active Problem List   Diagnosis Date Noted  . Elevated LDL cholesterol level 04/01/2018  . Decreased GFR 04/01/2018  . Healthcare maintenance 02/21/2018  . Depression, major, recurrent, moderate ([place]) 02/21/2018  . Paroxysmal atrial fibrillation ([place]) 06/23/2016  . Closed fracture of distal clavicle 12/10/2014  . CHB (complete heart block) ([place]) 06/09/2013  . Pacemaker - Medtronic dual-chamber implanted 2010 06/09/2013  . NSVT (nonsustained ventricular tachycardia) ([place]) 06/09/2013  . Acute laryngitis and tracheitis 12/15/2012  . Acute upper respiratory infections of unspecified site 06/10/2012  . Anxiety attack 06/12/2011  . RHINOSINUSITIS, ACUTE 12/28/2009  . CANDIDIASIS OF THE ESOPHAGUS 10/02/2007  . VAGINITIS, RECURRENT 10/02/2007  . VAGINAL DISCHARGE 10/02/2007  . ASTHMATIC BRONCHITIS, ACUTE 06/05/2007  . Seasonal allergic rhinitis 06/05/2007  . Vocal cord paralysis 06/05/2007  . Esophageal reflux 06/05/2007     Past Medical History:  Diagnosis Date  . Allergic rhinitis   . Asthmatic bronchitis   . Complete heart block ([place])   . GERD (gastroesophageal reflux disease)   . MRSA (methicillin resistant Staphylococcus aureus) 2013   left buttock  . Nonsustained ventricular tachycardia ([place])   . Presence  of permanent cardiac pacemaker 08-05-08   Medtronic Adapta  . Vaginitis   . Vocal cord paresis      Past Surgical History:  Procedure Laterality Date  . ACROMIO-CLAVICULAR JOINT REPAIR Left 12/10/2014   Procedure: LEFT CORACOCLAVICULAR RECONSTRUCTION WITH  ZIPLOOP;  Surgeon: Tania Ade, MD;  Location: [facility];  Service: Orthopedics;   Laterality: Left;  . APPENDECTOMY  05-2010  . DILATION AND CURETTAGE OF UTERUS  1978  . HAND SURGERY    . PACEMAKER INSERTION  08/05/08   Medtronic Adapta  . Vocal cord splint  2005     Family History  Problem Relation Age of Onset  . Hypertension Mother   . Coronary artery disease Mother   . Depression Mother   . Heart disease Mother   . Diabetes Father   . Heart disease Father   . Depression Father   . Hypertension Father   . Hypertension Sister   . Alcohol abuse Sister   . Depression Sister   . Diabetes Paternal Uncle   . Cancer Maternal Grandmother         colon  . Heart disease Maternal Grandfather   . Diabetes Maternal Grandfather   . Alcohol abuse Paternal Grandfather   . Heart disease Paternal Grandfather      Social History   Substance and Sexual Activity  Drug Use Never     Social History   Substance and Sexual Activity  Alcohol Use Not Currently     Social History   Tobacco Use  Smoking Status Never Smoker  Smokeless Tobacco Never Used     Outpatient Encounter Medications as of 04/01/2018  Medication Sig  . ALPRAZolam (XANAX) 0.25 MG tablet Take 0.25 mg by mouth at bedtime as needed.  Marland Kitchen aspirin 81 MG tablet Take 81 mg by mouth daily.  . Calcium Carbonate-Vitamin D (CALCIUM 500 + D) 500-125 MG-UNIT TABS Take 1 tablet by mouth daily.    Marland Kitchen FLUoxetine (PROZAC) 20 MG capsule Take 1 capsule by mouth daily.  . fluticasone (FLONASE) 50 MCG/ACT nasal spray USE 1 SPRAY IN EACH NOSTRIL DAILY  . metoprolol succinate (TOPROL-XL) 50 MG 24 hr tablet Take 1 tablet (50 mg total) by mouth daily.  . valACYclovir (VALTREX) 1000 MG tablet Take 1 tablet by mouth daily as needed.  . [DISCONTINUED] furosemide (LASIX) 20 MG tablet TAKE 1 TABLET BY MOUTH DAILY   No facility-administered encounter medications on file as of 04/01/2018.     Allergies: Erythromycin and Sulfa antibiotics  Body mass index is 23.82 kg/m.  Blood pressure 136/67, pulse 86, temperature  98.3 F (36.8 C), temperature source Oral, height 5' 6.25" (1.683 m), weight 148 lb 11.2 oz (67.4 kg), SpO2 100 %.  Review of Systems  Constitutional: Positive for fatigue. Negative for activity change, appetite change, chills, diaphoresis, fever and unexpected weight change.  Respiratory: Negative for cough, chest tightness, shortness of breath, wheezing and stridor.   Cardiovascular: Negative for chest pain, palpitations and leg swelling.  Gastrointestinal: Negative for abdominal distention, abdominal pain, blood in stool, constipation, diarrhea, nausea and vomiting.  Endocrine: Negative for cold intolerance, heat intolerance, polydipsia, polyphagia and polyuria.  Genitourinary: Negative for difficulty urinating and dysuria.  Musculoskeletal: Negative for arthralgias, back pain, gait problem, joint swelling, myalgias, neck pain and neck stiffness.  Neurological: Negative for dizziness and headaches.  Hematological: Does not bruise/bleed easily.  Psychiatric/Behavioral: Positive for dysphoric mood and sleep disturbance. Negative for agitation, behavioral problems, confusion, decreased concentration, hallucinations, self-injury and suicidal  ideas. The patient is nervous/anxious. The patient is not hyperactive.        Objective:   Physical Exam Vitals signs and nursing note reviewed.  Constitutional:      General: She is not in acute distress.    Appearance: She is well-developed. She is not diaphoretic.  HENT:     Head: Normocephalic and atraumatic.     Right Ear: External ear normal.     Left Ear: External ear normal.     Nose: Nose normal.  Eyes:     Conjunctiva/sclera: Conjunctivae normal.     Pupils: Pupils are equal, round, and reactive to light.  Cardiovascular:     Rate and Rhythm: Normal rate and regular rhythm.     Heart sounds: Normal heart sounds. No murmur.  Pulmonary:     Effort: Pulmonary effort is normal. No respiratory distress.     Breath sounds: Normal breath  sounds. No stridor. No wheezing or rales.  Chest:     Chest wall: No tenderness.  Skin:    General: Skin is warm and dry.     Capillary Refill: Capillary refill takes less than 2 seconds.     Coloration: Skin is not pale.     Findings: No erythema or rash.  Neurological:     Mental Status: She is alert and oriented to person, place, and time.  Psychiatric:        Speech: Speech normal.        Behavior: Behavior normal.        Thought Content: Thought content normal.        Judgment: Judgment normal.     Comments: Well groomed Tearful at times when speaking about her husband's illness or her sick friends        Assessment & Plan:   1. Healthcare maintenance   2. Elevated LDL cholesterol level   3. Decreased GFR   4. Depression, major, recurrent, moderate ([place])     Healthcare maintenance Continue all medications as directed. Continue to drink plenty of water and reduce sugar/carbohydrates/saturated fats to improve blood sugar and LDL (bad) cholesterol. Continue regular exercise. Continue with regular Cardiology follow-up. Follow-up here in 4 months, please come fasting we can re-check lab work. Wonderful that you are establishing with therapist and [place] support group!  Elevated LDL cholesterol level The 10-year ASCVD risk score Mikey Bussing DC Jr., et al., 2013) is: 5.6%   Values used to calculate the score:     Age: 61 years     Sex: Female     Is Non-Hispanic African American: No     Diabetic: No     Tobacco smoker: No     Systolic Blood Pressure: 539 mmHg     Is BP treated: No     HDL Cholesterol: 61 mg/dL     Total Cholesterol: 215 mg/dL LDL-134 TLC to normalize levels, we re-check lipids in 4 months   Decreased GFR 08/24/17 GFR 68 03/28/18 GFR 53 We recheck in 4 months   Depression, major, recurrent, moderate ([place]) Will establish with therapist next week and [place] support group next month Continue fluoxetine 20mg  QD Denies thoughts of harming  herself/others     FOLLOW-UP:  Return in about 4 months (around 08/01/2018) for Fasting Labs, Regular Follow Up.

## 2018-03-29 LAB — COMPREHENSIVE METABOLIC PANEL
A/G RATIO: 2 (ref 1.2–2.2)
ALT: 16 IU/L (ref 0–32)
AST: 17 IU/L (ref 0–40)
Albumin: 4.2 g/dL (ref 3.6–4.8)
Alkaline Phosphatase: 84 IU/L (ref 39–117)
BUN/Creatinine Ratio: 17 (ref 12–28)
BUN: 19 mg/dL (ref 8–27)
Bilirubin Total: 0.3 mg/dL (ref 0.0–1.2)
CALCIUM: 9.8 mg/dL (ref 8.7–10.3)
CO2: 25 mmol/L (ref 20–29)
Chloride: 104 mmol/L (ref 96–106)
Creatinine, Ser: 1.1 mg/dL — ABNORMAL HIGH (ref 0.57–1.00)
GFR, EST AFRICAN AMERICAN: 61 mL/min/{1.73_m2} (ref >59)
GFR, EST NON AFRICAN AMERICAN: 53 mL/min/{1.73_m2} — AB (ref >59)
Globulin, Total: 2.1 g/dL (ref 1.5–4.5)
Glucose: 87 mg/dL (ref 65–99)
POTASSIUM: 4.6 mmol/L (ref 3.5–5.2)
Sodium: 144 mmol/L (ref 134–144)
TOTAL PROTEIN: 6.3 g/dL (ref 6.0–8.5)

## 2018-03-29 LAB — CBC WITH DIFFERENTIAL/PLATELET
BASOS: 1 %
Basophils Absolute: 0 10*3/uL (ref 0.0–0.2)
EOS (ABSOLUTE): 0.2 10*3/uL (ref 0.0–0.4)
Eos: 4 %
HEMATOCRIT: 38.3 % (ref 34.0–46.6)
Hemoglobin: 12.9 g/dL (ref 11.1–15.9)
IMMATURE GRANS (ABS): 0 10*3/uL (ref 0.0–0.1)
Immature Granulocytes: 0 %
LYMPHS: 35 %
Lymphocytes Absolute: 1.6 10*3/uL (ref 0.7–3.1)
MCH: 32.6 pg (ref 26.6–33.0)
MCHC: 33.7 g/dL (ref 31.5–35.7)
MCV: 97 fL (ref 79–97)
Monocytes Absolute: 0.4 10*3/uL (ref 0.1–0.9)
Monocytes: 8 %
NEUTROS ABS: 2.3 10*3/uL (ref 1.4–7.0)
Neutrophils: 52 %
Platelets: 261 10*3/uL (ref 150–450)
RBC: 3.96 x10E6/uL (ref 3.77–5.28)
RDW: 12.1 % — ABNORMAL LOW (ref 12.3–15.4)
WBC: 4.4 10*3/uL (ref 3.4–10.8)

## 2018-03-29 LAB — HEMOGLOBIN A1C
ESTIMATED AVERAGE GLUCOSE: 123 mg/dL
Hgb A1c MFr Bld: 5.9 % — ABNORMAL HIGH (ref 4.8–5.6)

## 2018-03-29 LAB — TSH: TSH: 2.12 u[IU]/mL (ref 0.450–4.500)

## 2018-03-29 LAB — LIPID PANEL
CHOLESTEROL TOTAL: 215 mg/dL — AB (ref 100–199)
Chol/HDL Ratio: 3.5 ratio (ref 0.0–4.4)
HDL: 61 mg/dL (ref >39)
LDL Calculated: 134 mg/dL — ABNORMAL HIGH (ref 0–99)
TRIGLYCERIDES: 101 mg/dL (ref 0–149)
VLDL Cholesterol Cal: 20 mg/dL (ref 5–40)

## 2018-04-01 ENCOUNTER — Ambulatory Visit (INDEPENDENT_AMBULATORY_CARE_PROVIDER_SITE_OTHER): Payer: BC Managed Care – PPO | Admitting: Adult Health

## 2018-04-01 ENCOUNTER — Encounter: Payer: Self-pay | Admitting: Adult Health

## 2018-04-01 VITALS — BP 136/67 | HR 86 | Temp 98.3°F | Ht 66.25 in | Wt 148.7 lb

## 2018-04-01 DIAGNOSIS — Z0000 Encounter for general adult medical examination without abnormal findings: Secondary | ICD-10-CM | POA: Diagnosis not present

## 2018-04-01 DIAGNOSIS — F331 Major depressive disorder, recurrent, moderate: Secondary | ICD-10-CM

## 2018-04-01 DIAGNOSIS — E7800 Pure hypercholesterolemia, unspecified: Secondary | ICD-10-CM | POA: Diagnosis not present

## 2018-04-01 DIAGNOSIS — R944 Abnormal results of kidney function studies: Secondary | ICD-10-CM | POA: Diagnosis not present

## 2018-04-01 NOTE — Assessment & Plan Note (Signed)
Will establish with therapist next week and [place] support group next month Continue fluoxetine 20mg  QD Denies thoughts of harming herself/others

## 2018-04-01 NOTE — Assessment & Plan Note (Signed)
08/24/17 GFR 68 03/28/18 GFR 53 We recheck in 4 months

## 2018-04-01 NOTE — Assessment & Plan Note (Signed)
The 10-year ASCVD risk score Mikey Bussing DC Brooke Bonito., et al., 2013) is: 5.6%   Values used to calculate the score:     Age: 62 years     Sex: Female     Is Non-Hispanic African American: No     Diabetic: No     Tobacco smoker: No     Systolic Blood Pressure: 099 mmHg     Is BP treated: No     HDL Cholesterol: 61 mg/dL     Total Cholesterol: 215 mg/dL LDL-134 TLC to normalize levels, we re-check lipids in 4 months

## 2018-04-01 NOTE — Assessment & Plan Note (Signed)
Continue all medications as directed. Continue to drink plenty of water and reduce sugar/carbohydrates/saturated fats to improve blood sugar and LDL (bad) cholesterol. Continue regular exercise. Continue with regular Cardiology follow-up. Follow-up here in 4 months, please come fasting we can re-check lab work. Wonderful that you are establishing with therapist and [place] support group!

## 2018-04-01 NOTE — Patient Instructions (Signed)
Preventive Care for Adults, Female  A healthy lifestyle and preventive care can promote health and wellness. Preventive health guidelines for women include the following key practices.   A routine yearly physical is a good way to check with your health care provider about your health and preventive screening. It is a chance to share any concerns and updates on your health and to receive a thorough exam.   Visit your dentist for a routine exam and preventive care every 6 months. Brush your teeth twice a day and floss once a day. Good oral hygiene prevents tooth decay and gum disease.   The frequency of eye exams is based on your age, health, family medical history, use of contact lenses, and other factors. Follow your health care provider's recommendations for frequency of eye exams.   Eat a healthy diet. Foods like vegetables, fruits, whole grains, low-fat dairy products, and lean protein foods contain the nutrients you need without too many calories. Decrease your intake of foods high in solid fats, added sugars, and salt. Eat the right amount of calories for you.Get information about a proper diet from your health care provider, if necessary.   Regular physical exercise is one of the most important things you can do for your health. Most adults should get at least 150 minutes of moderate-intensity exercise (any activity that increases your heart rate and causes you to sweat) each week. In addition, most adults need muscle-strengthening exercises on 2 or more days a week.   Maintain a healthy weight. The body mass index (BMI) is a screening tool to identify possible weight problems. It provides an estimate of body fat based on height and weight. Your health care provider can find your BMI, and can help you achieve or maintain a healthy weight.For adults 20 years and older:   - A BMI below 18.5 is considered underweight.   - A BMI of 18.5 to 24.9 is normal.   - A BMI of 25 to 29.9 is  considered overweight.   - A BMI of 30 and above is considered obese.   Maintain normal blood lipids and cholesterol levels by exercising and minimizing your intake of trans and saturated fats.  Eat a balanced diet with plenty of fruit and vegetables. Blood tests for lipids and cholesterol should begin at age 20 and be repeated every 5 years minimum.  If your lipid or cholesterol levels are high, you are over 40, or you are at high risk for heart disease, you may need your cholesterol levels checked more frequently.Ongoing high lipid and cholesterol levels should be treated with medicines if diet and exercise are not working.   If you smoke, find out from your health care provider how to quit. If you do not use tobacco, do not start.   Lung cancer screening is recommended for adults aged 55-80 years who are at high risk for developing lung cancer because of a history of smoking. A yearly low-dose CT scan of the lungs is recommended for people who have at least a 30-pack-year history of smoking and are a current smoker or have quit within the past 15 years. A pack year of smoking is smoking an average of 1 pack of cigarettes a day for 1 year (for example: 1 pack a day for 30 years or 2 packs a day for 15 years). Yearly screening should continue until the smoker has stopped smoking for at least 15 years. Yearly screening should be stopped for people who develop a   health problem that would prevent them from having lung cancer treatment.   If you are pregnant, do not drink alcohol. If you are breastfeeding, be very cautious about drinking alcohol. If you are not pregnant and choose to drink alcohol, do not have more than 1 drink per day. One drink is considered to be 12 ounces (355 mL) of beer, 5 ounces (148 mL) of wine, or 1.5 ounces (44 mL) of liquor.   Avoid use of street drugs. Do not share needles with anyone. Ask for help if you need support or instructions about stopping the use of  drugs.   High blood pressure causes heart disease and increases the risk of stroke. Your blood pressure should be checked at least yearly.  Ongoing high blood pressure should be treated with medicines if weight loss and exercise do not work.   If you are 69-55 years old, ask your health care provider if you should take aspirin to prevent strokes.   Diabetes screening involves taking a blood sample to check your fasting blood sugar level. This should be done once every 3 years, after age 38, if you are within normal weight and without risk factors for diabetes. Testing should be considered at a younger age or be carried out more frequently if you are overweight and have at least 1 risk factor for diabetes.   Breast cancer screening is essential preventive care for women. You should practice "breast self-awareness."  This means understanding the normal appearance and feel of your breasts and may include breast self-examination.  Any changes detected, no matter how small, should be reported to a health care provider.  Women in their 80s and 30s should have a clinical breast exam (CBE) by a health care provider as part of a regular health exam every 1 to 3 years.  After age 66, women should have a CBE every year.  Starting at age 1, women should consider having a mammogram (breast X-ray test) every year.  Women who have a family history of breast cancer should talk to their health care provider about genetic screening.  Women at a high risk of breast cancer should talk to their health care providers about having an MRI and a mammogram every year.   -Breast cancer gene (BRCA)-related cancer risk assessment is recommended for women who have family members with BRCA-related cancers. BRCA-related cancers include breast, ovarian, tubal, and peritoneal cancers. Having family members with these cancers may be associated with an increased risk for harmful changes (mutations) in the breast cancer genes BRCA1 and  BRCA2. Results of the assessment will determine the need for genetic counseling and BRCA1 and BRCA2 testing.   The Pap test is a screening test for cervical cancer. A Pap test can show cell changes on the cervix that might become cervical cancer if left untreated. A Pap test is a procedure in which cells are obtained and examined from the lower end of the uterus (cervix).   - Women should have a Pap test starting at age 57.   - Between ages 90 and 70, Pap tests should be repeated every 2 years.   - Beginning at age 63, you should have a Pap test every 3 years as long as the past 3 Pap tests have been normal.   - Some women have medical problems that increase the chance of getting cervical cancer. Talk to your health care provider about these problems. It is especially important to talk to your health care provider if a  new problem develops soon after your last Pap test. In these cases, your health care provider may recommend more frequent screening and Pap tests.   - The above recommendations are the same for women who have or have not gotten the vaccine for human papillomavirus (HPV).   - If you had a hysterectomy for a problem that was not cancer or a condition that could lead to cancer, then you no longer need Pap tests. Even if you no longer need a Pap test, a regular exam is a good idea to make sure no other problems are starting.   - If you are between ages 36 and 66 years, and you have had normal Pap tests going back 10 years, you no longer need Pap tests. Even if you no longer need a Pap test, a regular exam is a good idea to make sure no other problems are starting.   - If you have had past treatment for cervical cancer or a condition that could lead to cancer, you need Pap tests and screening for cancer for at least 20 years after your treatment.   - If Pap tests have been discontinued, risk factors (such as a new sexual partner) need to be reassessed to determine if screening should  be resumed.   - The HPV test is an additional test that may be used for cervical cancer screening. The HPV test looks for the virus that can cause the cell changes on the cervix. The cells collected during the Pap test can be tested for HPV. The HPV test could be used to screen women aged 70 years and older, and should be used in women of any age who have unclear Pap test results. After the age of 67, women should have HPV testing at the same frequency as a Pap test.   Colorectal cancer can be detected and often prevented. Most routine colorectal cancer screening begins at the age of 57 years and continues through age 26 years. However, your health care provider may recommend screening at an earlier age if you have risk factors for colon cancer. On a yearly basis, your health care provider may provide home test kits to check for hidden blood in the stool.  Use of a small camera at the end of a tube, to directly examine the colon (sigmoidoscopy or colonoscopy), can detect the earliest forms of colorectal cancer. Talk to your health care provider about this at age 23, when routine screening begins. Direct exam of the colon should be repeated every 5 -10 years through age 49 years, unless early forms of pre-cancerous polyps or small growths are found.   People who are at an increased risk for hepatitis B should be screened for this virus. You are considered at high risk for hepatitis B if:  -You were born in a country where hepatitis B occurs often. Talk with your health care provider about which countries are considered high risk.  - Your parents were born in a high-risk country and you have not received a shot to protect against hepatitis B (hepatitis B vaccine).  - You have HIV or AIDS.  - You use needles to inject street drugs.  - You live with, or have sex with, someone who has Hepatitis B.  - You get hemodialysis treatment.  - You take certain medicines for conditions like cancer, organ  transplantation, and autoimmune conditions.   Hepatitis C blood testing is recommended for all people born from [birth year] through 1965 and any individual  with known risks for hepatitis C.   Practice safe sex. Use condoms and avoid high-risk sexual practices to reduce the spread of sexually transmitted infections (STIs). STIs include gonorrhea, chlamydia, syphilis, trichomonas, herpes, HPV, and human immunodeficiency virus (HIV). Herpes, HIV, and HPV are viral illnesses that have no cure. They can result in disability, cancer, and death. Sexually active women aged 25 years and younger should be checked for chlamydia. Older women with new or multiple partners should also be tested for chlamydia. Testing for other STIs is recommended if you are sexually active and at increased risk.   Osteoporosis is a disease in which the bones lose minerals and strength with aging. This can result in serious bone fractures or breaks. The risk of osteoporosis can be identified using a bone density scan. Women ages 65 years and over and women at risk for fractures or osteoporosis should discuss screening with their health care providers. Ask your health care provider whether you should take a calcium supplement or vitamin D to There are also several preventive steps women can take to avoid osteoporosis and resulting fractures or to keep osteoporosis from worsening. -->Recommendations include:  Eat a balanced diet high in fruits, vegetables, calcium, and vitamins.  Get enough calcium. The recommended total intake of is 1,200 mg daily; for best absorption, if taking supplements, divide doses into 250-500 mg doses throughout the day. Of the two types of calcium, calcium carbonate is best absorbed when taken with food but calcium citrate can be taken on an empty stomach.  Get enough vitamin D. NAMS and the National Osteoporosis Foundation recommend at least 1,000 IU per day for women age 50 and over who are at risk of vitamin D  deficiency. Vitamin D deficiency can be caused by inadequate sun exposure (for example, those who live in northern latitudes).  Avoid alcohol and smoking. Heavy alcohol intake (more than 7 drinks per week) increases the risk of falls and hip fracture and women smokers tend to lose bone more rapidly and have lower bone mass than nonsmokers. Stopping smoking is one of the most important changes women can make to improve their health and decrease risk for disease.  Be physically active every day. Weight-bearing exercise (for example, fast walking, hiking, jogging, and weight training) may strengthen bones or slow the rate of bone loss that comes with aging. Balancing and muscle-strengthening exercises can reduce the risk of falling and fracture.  Consider therapeutic medications. Currently, several types of effective drugs are available. Healthcare providers can recommend the type most appropriate for each woman.  Eliminate environmental factors that may contribute to accidents. Falls cause nearly 90% of all osteoporotic fractures, so reducing this risk is an important bone-health strategy. Measures include ample lighting, removing obstructions to walking, using nonskid rugs on floors, and placing mats and/or grab bars in showers.  Be aware of medication side effects. Some common medicines make bones weaker. These include a type of steroid drug called glucocorticoids used for arthritis and asthma, some antiseizure drugs, certain sleeping pills, treatments for endometriosis, and some cancer drugs. An overactive thyroid gland or using too much thyroid hormone for an underactive thyroid can also be a problem. If you are taking these medicines, talk to your doctor about what you can do to help protect your bones.reduce the rate of osteoporosis.    Menopause can be associated with physical symptoms and risks. Hormone replacement therapy is available to decrease symptoms and risks. You should talk to your  health care provider   about whether hormone replacement therapy is right for you.   Use sunscreen. Apply sunscreen liberally and repeatedly throughout the day. You should seek shade when your shadow is shorter than you. Protect yourself by wearing long sleeves, pants, a wide-brimmed hat, and sunglasses year round, whenever you are outdoors.   Once a month, do a whole body skin exam, using a mirror to look at the skin on your back. Tell your health care provider of new moles, moles that have irregular borders, moles that are larger than a pencil eraser, or moles that have changed in shape or color.   -Stay current with required vaccines (immunizations).   Influenza vaccine. All adults should be immunized every year.  Tetanus, diphtheria, and acellular pertussis (Td, Tdap) vaccine. Pregnant women should receive 1 dose of Tdap vaccine during each pregnancy. The dose should be obtained regardless of the length of time since the last dose. Immunization is preferred during the 27th 36th week of gestation. An adult who has not previously received Tdap or who does not know her vaccine status should receive 1 dose of Tdap. This initial dose should be followed by tetanus and diphtheria toxoids (Td) booster doses every 10 years. Adults with an unknown or incomplete history of completing a 3-dose immunization series with Td-containing vaccines should begin or complete a primary immunization series including a Tdap dose. Adults should receive a Td booster every 10 years.  Varicella vaccine. An adult without evidence of immunity to varicella should receive 2 doses or a second dose if she has previously received 1 dose. Pregnant females who do not have evidence of immunity should receive the first dose after pregnancy. This first dose should be obtained before leaving the health care facility. The second dose should be obtained 4 8 weeks after the first dose.  Human papillomavirus (HPV) vaccine. Females aged 13 26  years who have not received the vaccine previously should obtain the 3-dose series. The vaccine is not recommended for use in pregnant females. However, pregnancy testing is not needed before receiving a dose. If a female is found to be pregnant after receiving a dose, no treatment is needed. In that case, the remaining doses should be delayed until after the pregnancy. Immunization is recommended for any person with an immunocompromised condition through the age of 26 years if she did not get any or all doses earlier. During the 3-dose series, the second dose should be obtained 4 8 weeks after the first dose. The third dose should be obtained 24 weeks after the first dose and 16 weeks after the second dose.  Zoster vaccine. One dose is recommended for adults aged 60 years or older unless certain conditions are present.  Measles, mumps, and rubella (MMR) vaccine. Adults born before 1957 generally are considered immune to measles and mumps. Adults born in 1957 or later should have 1 or more doses of MMR vaccine unless there is a contraindication to the vaccine or there is laboratory evidence of immunity to each of the three diseases. A routine second dose of MMR vaccine should be obtained at least 28 days after the first dose for students attending postsecondary schools, health care workers, or international travelers. People who received inactivated measles vaccine or an unknown type of measles vaccine during 1963 1967 should receive 2 doses of MMR vaccine. People who received inactivated mumps vaccine or an unknown type of mumps vaccine before 1979 and are at high risk for mumps infection should consider immunization with 2 doses of   MMR vaccine. For females of childbearing age, rubella immunity should be determined. If there is no evidence of immunity, females who are not pregnant should be vaccinated. If there is no evidence of immunity, females who are pregnant should delay immunization until after pregnancy.  Unvaccinated health care workers born before [birth year] who lack laboratory evidence of measles, mumps, or rubella immunity or laboratory confirmation of disease should consider measles and mumps immunization with 2 doses of MMR vaccine or rubella immunization with 1 dose of MMR vaccine.  Pneumococcal 13-valent conjugate (PCV13) vaccine. When indicated, a person who is uncertain of her immunization history and has no record of immunization should receive the PCV13 vaccine. An adult aged 54 years or older who has certain medical conditions and has not been previously immunized should receive 1 dose of PCV13 vaccine. This PCV13 should be followed with a dose of pneumococcal polysaccharide (PPSV23) vaccine. The PPSV23 vaccine dose should be obtained at least 8 weeks after the dose of PCV13 vaccine. An adult aged 58 years or older who has certain medical conditions and previously received 1 or more doses of PPSV23 vaccine should receive 1 dose of PCV13. The PCV13 vaccine dose should be obtained 1 or more years after the last PPSV23 vaccine dose.  Pneumococcal polysaccharide (PPSV23) vaccine. When PCV13 is also indicated, PCV13 should be obtained first. All adults aged 58 years and older should be immunized. An adult younger than age 65 years who has certain medical conditions should be immunized. Any person who resides in a nursing home or long-term care facility should be immunized. An adult smoker should be immunized. People with an immunocompromised condition and certain other conditions should receive both PCV13 and PPSV23 vaccines. People with human immunodeficiency virus (HIV) infection should be immunized as soon as possible after diagnosis. Immunization during chemotherapy or radiation therapy should be avoided. Routine use of PPSV23 vaccine is not recommended for American Indians, [place] Natives, or people younger than 65 years unless there are medical conditions that require PPSV23 vaccine. When indicated,  people who have unknown immunization and have no record of immunization should receive PPSV23 vaccine. One-time revaccination 5 years after the first dose of PPSV23 is recommended for people aged 70 64 years who have chronic kidney failure, nephrotic syndrome, asplenia, or immunocompromised conditions. People who received 1 2 doses of PPSV23 before age 32 years should receive another dose of PPSV23 vaccine at age 96 years or later if at least 5 years have passed since the previous dose. Doses of PPSV23 are not needed for people immunized with PPSV23 at or after age 55 years.  Meningococcal vaccine. Adults with asplenia or persistent complement component deficiencies should receive 2 doses of quadrivalent meningococcal conjugate (MenACWY-D) vaccine. The doses should be obtained at least 2 months apart. Microbiologists working with certain meningococcal bacteria, [employer] recruits, people at risk during an outbreak, and people who travel to or live in countries with a high rate of meningitis should be immunized. A first-year college student up through age 58 years who is living in a residence hall should receive a dose if she did not receive a dose on or after her 16th birthday. Adults who have certain high-risk conditions should receive one or more doses of vaccine.  Hepatitis A vaccine. Adults who wish to be protected from this disease, have certain high-risk conditions, work with hepatitis A-infected animals, work in hepatitis A research labs, or travel to or work in countries with a high rate of hepatitis A should be  immunized. Adults who were previously unvaccinated and who anticipate close contact with an international adoptee during the first 60 days after arrival in the [place] from a country with a high rate of hepatitis A should be immunized.  Hepatitis B vaccine.  Adults who wish to be protected from this disease, have certain high-risk conditions, may be exposed to blood or other infectious  body fluids, are household contacts or sex partners of hepatitis B positive people, are clients or workers in certain care facilities, or travel to or work in countries with a high rate of hepatitis B should be immunized.  Haemophilus influenzae type b (Hib) vaccine. A previously unvaccinated person with asplenia or sickle cell disease or having a scheduled splenectomy should receive 1 dose of Hib vaccine. Regardless of previous immunization, a recipient of a hematopoietic stem cell transplant should receive a 3-dose series 6 12 months after her successful transplant. Hib vaccine is not recommended for adults with HIV infection.  Preventive Services / Frequency Ages 6 to 39years  Blood pressure check.** / Every 1 to 2 years.  Lipid and cholesterol check.** / Every 5 years beginning at age 39.  Clinical breast exam.** / Every 3 years for women in their 61s and 62s.  BRCA-related cancer risk assessment.** / For women who have family members with a BRCA-related cancer (breast, ovarian, tubal, or peritoneal cancers).  Pap test.** / Every 2 years from ages 47 through 85. Every 3 years starting at age 34 through age 12 or 74 with a history of 3 consecutive normal Pap tests.  HPV screening.** / Every 3 years from ages 46 through ages 43 to 54 with a history of 3 consecutive normal Pap tests.  Hepatitis C blood test.** / For any individual with known risks for hepatitis C.  Skin self-exam. / Monthly.  Influenza vaccine. / Every year.  Tetanus, diphtheria, and acellular pertussis (Tdap, Td) vaccine.** / Consult your health care provider. Pregnant women should receive 1 dose of Tdap vaccine during each pregnancy. 1 dose of Td every 10 years.  Varicella vaccine.** / Consult your health care provider. Pregnant females who do not have evidence of immunity should receive the first dose after pregnancy.  HPV vaccine. / 3 doses over 6 months, if 64 and younger. The vaccine is not recommended for use in  pregnant females. However, pregnancy testing is not needed before receiving a dose.  Measles, mumps, rubella (MMR) vaccine.** / You need at least 1 dose of MMR if you were born in 1957 or later. You may also need a 2nd dose. For females of childbearing age, rubella immunity should be determined. If there is no evidence of immunity, females who are not pregnant should be vaccinated. If there is no evidence of immunity, females who are pregnant should delay immunization until after pregnancy.  Pneumococcal 13-valent conjugate (PCV13) vaccine.** / Consult your health care provider.  Pneumococcal polysaccharide (PPSV23) vaccine.** / 1 to 2 doses if you smoke cigarettes or if you have certain conditions.  Meningococcal vaccine.** / 1 dose if you are age 71 to 35 years and a Market researcher living in a residence hall, or have one of several medical conditions, you need to get vaccinated against meningococcal disease. You may also need additional booster doses.  Hepatitis A vaccine.** / Consult your health care provider.  Hepatitis B vaccine.** / Consult your health care provider.  Haemophilus influenzae type b (Hib) vaccine.** / Consult your health care provider.  Ages 55 to 64years  Blood pressure check.** / Every 1 to 2 years.  Lipid and cholesterol check.** / Every 5 years beginning at age 20 years.  Lung cancer screening. / Every year if you are aged 55 80 years and have a 30-pack-year history of smoking and currently smoke or have quit within the past 15 years. Yearly screening is stopped once you have quit smoking for at least 15 years or develop a health problem that would prevent you from having lung cancer treatment.  Clinical breast exam.** / Every year after age 40 years.  BRCA-related cancer risk assessment.** / For women who have family members with a BRCA-related cancer (breast, ovarian, tubal, or peritoneal cancers).  Mammogram.** / Every year beginning at age 40  years and continuing for as long as you are in good health. Consult with your health care provider.  Pap test.** / Every 3 years starting at age 30 years through age 65 or 70 years with a history of 3 consecutive normal Pap tests.  HPV screening.** / Every 3 years from ages 30 years through ages 65 to 70 years with a history of 3 consecutive normal Pap tests.  Fecal occult blood test (FOBT) of stool. / Every year beginning at age 50 years and continuing until age 75 years. You may not need to do this test if you get a colonoscopy every 10 years.  Flexible sigmoidoscopy or colonoscopy.** / Every 5 years for a flexible sigmoidoscopy or every 10 years for a colonoscopy beginning at age 50 years and continuing until age 75 years.  Hepatitis C blood test.** / For all people born from 1945 through 1965 and any individual with known risks for hepatitis C.  Skin self-exam. / Monthly.  Influenza vaccine. / Every year.  Tetanus, diphtheria, and acellular pertussis (Tdap/Td) vaccine.** / Consult your health care provider. Pregnant women should receive 1 dose of Tdap vaccine during each pregnancy. 1 dose of Td every 10 years.  Varicella vaccine.** / Consult your health care provider. Pregnant females who do not have evidence of immunity should receive the first dose after pregnancy.  Zoster vaccine.** / 1 dose for adults aged 60 years or older.  Measles, mumps, rubella (MMR) vaccine.** / You need at least 1 dose of MMR if you were born in 1957 or later. You may also need a 2nd dose. For females of childbearing age, rubella immunity should be determined. If there is no evidence of immunity, females who are not pregnant should be vaccinated. If there is no evidence of immunity, females who are pregnant should delay immunization until after pregnancy.  Pneumococcal 13-valent conjugate (PCV13) vaccine.** / Consult your health care provider.  Pneumococcal polysaccharide (PPSV23) vaccine.** / 1 to 2 doses if  you smoke cigarettes or if you have certain conditions.  Meningococcal vaccine.** / Consult your health care provider.  Hepatitis A vaccine.** / Consult your health care provider.  Hepatitis B vaccine.** / Consult your health care provider.  Haemophilus influenzae type b (Hib) vaccine.** / Consult your health care provider.  Ages 65 years and over  Blood pressure check.** / Every 1 to 2 years.  Lipid and cholesterol check.** / Every 5 years beginning at age 20 years.  Lung cancer screening. / Every year if you are aged 55 80 years and have a 30-pack-year history of smoking and currently smoke or have quit within the past 15 years. Yearly screening is stopped once you have quit smoking for at least 15 years or develop a health problem that   would prevent you from having lung cancer treatment.  Clinical breast exam.** / Every year after age 103 years.  BRCA-related cancer risk assessment.** / For women who have family members with a BRCA-related cancer (breast, ovarian, tubal, or peritoneal cancers).  Mammogram.** / Every year beginning at age 36 years and continuing for as long as you are in good health. Consult with your health care provider.  Pap test.** / Every 3 years starting at age 5 years through age 85 or 10 years with 3 consecutive normal Pap tests. Testing can be stopped between 65 and 70 years with 3 consecutive normal Pap tests and no abnormal Pap or HPV tests in the past 10 years.  HPV screening.** / Every 3 years from ages 93 years through ages 70 or 45 years with a history of 3 consecutive normal Pap tests. Testing can be stopped between 65 and 70 years with 3 consecutive normal Pap tests and no abnormal Pap or HPV tests in the past 10 years.  Fecal occult blood test (FOBT) of stool. / Every year beginning at age 8 years and continuing until age 45 years. You may not need to do this test if you get a colonoscopy every 10 years.  Flexible sigmoidoscopy or colonoscopy.** /  Every 5 years for a flexible sigmoidoscopy or every 10 years for a colonoscopy beginning at age 69 years and continuing until age 68 years.  Hepatitis C blood test.** / For all people born from [birth year] through 1967 and any individual with known risks for hepatitis C.  Osteoporosis screening.** / A one-time screening for women ages 7 years and over and women at risk for fractures or osteoporosis.  Skin self-exam. / Monthly.  Influenza vaccine. / Every year.  Tetanus, diphtheria, and acellular pertussis (Tdap/Td) vaccine.** / 1 dose of Td every 10 years.  Varicella vaccine.** / Consult your health care provider.  Zoster vaccine.** / 1 dose for adults aged 5 years or older.  Pneumococcal 13-valent conjugate (PCV13) vaccine.** / Consult your health care provider.  Pneumococcal polysaccharide (PPSV23) vaccine.** / 1 dose for all adults aged 74 years and older.  Meningococcal vaccine.** / Consult your health care provider.  Hepatitis A vaccine.** / Consult your health care provider.  Hepatitis B vaccine.** / Consult your health care provider.  Haemophilus influenzae type b (Hib) vaccine.** / Consult your health care provider. ** Family history and personal history of risk and conditions may change your health care provider's recommendations. Document Released: 05/16/2001 Document Revised: 01/08/2013  [facility] Patient Information 2014 [place], [place].   EXERCISE AND DIET:  We recommended that you start or continue a regular exercise program for good health. Regular exercise means any activity that makes your heart beat faster and makes you sweat.  We recommend exercising at least 30 minutes per day at least 3 days a week, preferably 5.  We also recommend a diet low in fat and sugar / carbohydrates.  Inactivity, poor dietary choices and obesity can cause diabetes, heart attack, stroke, and kidney damage, among others.     ALCOHOL AND SMOKING:  Women should limit their alcohol intake to no  more than 7 drinks/beers/glasses of wine (combined, not each!) per week. Moderation of alcohol intake to this level decreases your risk of breast cancer and liver damage.  ( And of course, no recreational drugs are part of a healthy lifestyle.)  Also, you should not be smoking at all or even being exposed to second hand smoke. Most people know smoking can  cause cancer, and various heart and lung diseases, but did you know it also contributes to weakening of your bones?  Aging of your skin?  Yellowing of your teeth and nails?   CALCIUM AND VITAMIN D:  Adequate intake of calcium and Vitamin D are recommended.  The recommendations for exact amounts of these supplements seem to change often, but generally speaking 600 mg of calcium (either carbonate or citrate) and 800 units of Vitamin D per day seems prudent. Certain women may benefit from higher intake of Vitamin D.  If you are among these women, your doctor will have told you during your visit.     PAP SMEARS:  Pap smears, to check for cervical cancer or precancers,  have traditionally been done yearly, although recent scientific advances have shown that most women can have pap smears less often.  However, every woman still should have a physical exam from her gynecologist or primary care physician every year. It will include a breast check, inspection of the vulva and vagina to check for abnormal growths or skin changes, a visual exam of the cervix, and then an exam to evaluate the size and shape of the uterus and ovaries.  And after 64 years of age, a rectal exam is indicated to check for rectal cancers. We will also provide age appropriate advice regarding health maintenance, like when you should have certain vaccines, screening for sexually transmitted diseases, bone density testing, colonoscopy, mammograms, etc.    MAMMOGRAMS:  All women over 94 years old should have a yearly mammogram. Many facilities now offer a "3D" mammogram, which may cost  around $50 extra out of pocket. If possible,  we recommend you accept the option to have the 3D mammogram performed.  It both reduces the number of women who will be called back for extra views which then turn out to be normal, and it is better than the routine mammogram at detecting truly abnormal areas.     COLONOSCOPY:  Colonoscopy to screen for colon cancer is recommended for all women at age 27.  We know, you hate the idea of the prep.  We agree, BUT, having colon cancer and not knowing it is worse!!  Colon cancer so often starts as a polyp that can be seen and removed at colonscopy, which can quite literally save your life!  And if your first colonoscopy is normal and you have no family history of colon cancer, most women don't have to have it again for 10 years.  Once every ten years, you can do something that may end up saving your life, right?  We will be happy to help you get it scheduled when you are ready.  Be sure to check your insurance coverage so you understand how much it will cost.  It may be covered as a preventative service at no cost, but you should check your particular policy.    Continue all medications as directed. Continue to drink plenty of water and reduce sugar/carbohydrates/saturated fats to improve blood sugar and LDL (bad) cholesterol. Continue regular exercise. Continue with regular Cardiology follow-up. Follow-up here in 4 months, please come fasting we can re-check lab work. Wonderful that you are establishing with therapist and [place] support group! HAPPY NEW YEAR! GREAT TO SEE YOU!

## 2018-04-05 ENCOUNTER — Ambulatory Visit (INDEPENDENT_AMBULATORY_CARE_PROVIDER_SITE_OTHER): Payer: BC Managed Care – PPO

## 2018-04-05 VITALS — BP 133/73 | HR 90 | Temp 98.1°F

## 2018-04-05 DIAGNOSIS — Z23 Encounter for immunization: Secondary | ICD-10-CM

## 2018-04-05 NOTE — Progress Notes (Signed)
Patient came in for her 2nd Shingrix vaccine.  Patient tolerated injection well. MPulliam, CMA/RT(R)

## 2018-04-10 ENCOUNTER — Ambulatory Visit (INDEPENDENT_AMBULATORY_CARE_PROVIDER_SITE_OTHER): Payer: BC Managed Care – PPO

## 2018-04-10 DIAGNOSIS — I442 Atrioventricular block, complete: Secondary | ICD-10-CM

## 2018-04-11 LAB — CUP PACEART REMOTE DEVICE CHECK
Battery Remaining Longevity: 29 mo
Battery Voltage: 2.75 V
Brady Statistic AS VP Percent: 11 %
Date Time Interrogation Session: 20200108144309
Implantable Lead Implant Date: 20100505
Implantable Lead Location: 753859
Implantable Lead Model: 5076
Lead Channel Pacing Threshold Pulse Width: 0.4 ms
Lead Channel Setting Pacing Amplitude: 2 V
Lead Channel Setting Pacing Pulse Width: 0.4 ms
Lead Channel Setting Sensing Sensitivity: 2 mV
MDC IDC LEAD IMPLANT DT: 20100505
MDC IDC LEAD LOCATION: 753860
MDC IDC MSMT BATTERY IMPEDANCE: 1979 Ohm
MDC IDC MSMT LEADCHNL RA IMPEDANCE VALUE: 473 Ohm
MDC IDC MSMT LEADCHNL RA PACING THRESHOLD AMPLITUDE: 0.5 V
MDC IDC MSMT LEADCHNL RA PACING THRESHOLD PULSEWIDTH: 0.4 ms
MDC IDC MSMT LEADCHNL RV IMPEDANCE VALUE: 597 Ohm
MDC IDC MSMT LEADCHNL RV PACING THRESHOLD AMPLITUDE: 0.625 V
MDC IDC PG IMPLANT DT: 20100505
MDC IDC SET LEADCHNL RV PACING AMPLITUDE: 2.5 V
MDC IDC STAT BRADY AP VP PERCENT: 88 %
MDC IDC STAT BRADY AP VS PERCENT: 0 %
MDC IDC STAT BRADY AS VS PERCENT: 0 %

## 2018-04-11 NOTE — Progress Notes (Signed)
Remote pacemaker transmission.   

## 2018-04-12 ENCOUNTER — Ambulatory Visit (INDEPENDENT_AMBULATORY_CARE_PROVIDER_SITE_OTHER): Payer: BC Managed Care – PPO | Admitting: Psychology

## 2018-04-12 DIAGNOSIS — F4323 Adjustment disorder with mixed anxiety and depressed mood: Secondary | ICD-10-CM

## 2018-04-19 ENCOUNTER — Ambulatory Visit (INDEPENDENT_AMBULATORY_CARE_PROVIDER_SITE_OTHER): Payer: BC Managed Care – PPO | Admitting: Psychology

## 2018-04-19 DIAGNOSIS — F4323 Adjustment disorder with mixed anxiety and depressed mood: Secondary | ICD-10-CM

## 2018-04-19 DIAGNOSIS — F329 Major depressive disorder, single episode, unspecified: Secondary | ICD-10-CM

## 2018-05-06 ENCOUNTER — Ambulatory Visit: Payer: BC Managed Care – PPO | Admitting: Psychology

## 2018-05-09 ENCOUNTER — Telehealth: Payer: Self-pay | Admitting: Adult Health

## 2018-05-09 NOTE — Telephone Encounter (Signed)
Patient called states she keeps her grandson has been diagnosed with the FLU & she is his caregiver (wishes PCP to call in Tamiflu to :   [facility] [place], [place]. [phone] (Phone) [phone] (Fax)   --Dion Body

## 2018-05-09 NOTE — Telephone Encounter (Signed)
Last CMP was 03/28/18 with GFR of 53.  Please review and approve RX if appropriate.  Charyl Bigger, CMA

## 2018-05-27 ENCOUNTER — Ambulatory Visit (INDEPENDENT_AMBULATORY_CARE_PROVIDER_SITE_OTHER): Payer: BC Managed Care – PPO | Admitting: Psychology

## 2018-05-27 DIAGNOSIS — F329 Major depressive disorder, single episode, unspecified: Secondary | ICD-10-CM | POA: Diagnosis not present

## 2018-05-27 DIAGNOSIS — F4323 Adjustment disorder with mixed anxiety and depressed mood: Secondary | ICD-10-CM

## 2018-06-17 ENCOUNTER — Ambulatory Visit (INDEPENDENT_AMBULATORY_CARE_PROVIDER_SITE_OTHER): Payer: BC Managed Care – PPO | Admitting: Psychology

## 2018-06-17 DIAGNOSIS — F4323 Adjustment disorder with mixed anxiety and depressed mood: Secondary | ICD-10-CM | POA: Diagnosis not present

## 2018-06-17 DIAGNOSIS — F329 Major depressive disorder, single episode, unspecified: Secondary | ICD-10-CM

## 2018-07-01 ENCOUNTER — Other Ambulatory Visit: Payer: Self-pay | Admitting: Cardiovascular Disease

## 2018-07-01 ENCOUNTER — Ambulatory Visit (INDEPENDENT_AMBULATORY_CARE_PROVIDER_SITE_OTHER): Payer: BC Managed Care – PPO | Admitting: Psychology

## 2018-07-01 DIAGNOSIS — F329 Major depressive disorder, single episode, unspecified: Secondary | ICD-10-CM | POA: Diagnosis not present

## 2018-07-01 DIAGNOSIS — F4323 Adjustment disorder with mixed anxiety and depressed mood: Secondary | ICD-10-CM

## 2018-07-10 ENCOUNTER — Ambulatory Visit (INDEPENDENT_AMBULATORY_CARE_PROVIDER_SITE_OTHER): Payer: BC Managed Care – PPO | Admitting: *Deleted

## 2018-07-10 ENCOUNTER — Other Ambulatory Visit: Payer: Self-pay

## 2018-07-10 DIAGNOSIS — I442 Atrioventricular block, complete: Secondary | ICD-10-CM | POA: Diagnosis not present

## 2018-07-11 LAB — CUP PACEART REMOTE DEVICE CHECK
Battery Impedance: 2079 Ohm
Battery Remaining Longevity: 26 mo
Battery Voltage: 2.75 V
Brady Statistic AP VP Percent: 87 %
Brady Statistic AP VS Percent: 0 %
Brady Statistic AS VP Percent: 13 %
Brady Statistic AS VS Percent: 0 %
Date Time Interrogation Session: 20200408135424
Implantable Lead Implant Date: 20100505
Implantable Lead Implant Date: 20100505
Implantable Lead Location: 753859
Implantable Lead Location: 753860
Implantable Lead Model: 5076
Implantable Lead Model: 5076
Implantable Pulse Generator Implant Date: 20100505
Lead Channel Impedance Value: 439 Ohm
Lead Channel Impedance Value: 535 Ohm
Lead Channel Pacing Threshold Amplitude: 0.5 V
Lead Channel Pacing Threshold Amplitude: 0.625 V
Lead Channel Pacing Threshold Pulse Width: 0.4 ms
Lead Channel Pacing Threshold Pulse Width: 0.4 ms
Lead Channel Setting Pacing Amplitude: 2 V
Lead Channel Setting Pacing Amplitude: 2.5 V
Lead Channel Setting Pacing Pulse Width: 0.4 ms
Lead Channel Setting Sensing Sensitivity: 2 mV

## 2018-07-19 ENCOUNTER — Encounter: Payer: Self-pay | Admitting: Cardiology

## 2018-07-19 NOTE — Progress Notes (Signed)
Remote pacemaker transmission.   

## 2018-07-24 ENCOUNTER — Ambulatory Visit (INDEPENDENT_AMBULATORY_CARE_PROVIDER_SITE_OTHER): Payer: BC Managed Care – PPO | Admitting: Psychology

## 2018-07-24 ENCOUNTER — Telehealth: Payer: Self-pay

## 2018-07-24 DIAGNOSIS — F329 Major depressive disorder, single episode, unspecified: Secondary | ICD-10-CM

## 2018-07-24 DIAGNOSIS — F4323 Adjustment disorder with mixed anxiety and depressed mood: Secondary | ICD-10-CM | POA: Diagnosis not present

## 2018-07-24 NOTE — Telephone Encounter (Signed)
Follow Up:; ° ° °Returning your call. °

## 2018-07-24 NOTE — Telephone Encounter (Signed)
Left message for patient to contact office to reschedule ov to an virtual visit.

## 2018-07-24 NOTE — Telephone Encounter (Signed)
Left message for patient to contact office for virtual visit cardiac prescreen.

## 2018-07-24 NOTE — Telephone Encounter (Signed)
New message ° ° °Patient is returning your call. °

## 2018-07-25 NOTE — Telephone Encounter (Signed)
Virtual Visit Pre-Appointment Phone Call  "Brandy [name], I am calling you today to discuss your upcoming appointment. We are currently trying to limit exposure to the virus that causes COVID-19 by seeing patients at home rather than in the office."  1. "What is the BEST phone number to call the day of the visit?" - include this in appointment notes  2. "Do you have or have access to (through a family member/friend) a smartphone with video capability that we can use for your visit?" a. If yes - list this number in appt notes as "cell" (if different from BEST phone #) and list the appointment type as a VIDEO visit in appointment notes b. If no - list the appointment type as a PHONE visit in appointment notes  3. Confirm consent - "In the setting of the current Covid19 crisis, you are scheduled for a VIDEO visit with your provider on 07/30/2018 at 9:00am.  Just as we do with many in-office visits, in order for you to participate in this visit, we must obtain consent.  If you'd like, I can send this to your mychart (if signed up) or email for you to review.  Otherwise, I can obtain your verbal consent now.  All virtual visits are billed to your insurance company just like a normal visit would be.  By agreeing to a virtual visit, we'd like you to understand that the technology does not allow for your provider to perform an examination, and thus may limit your provider's ability to fully assess your condition. If your provider identifies any concerns that need to be evaluated in person, we will make arrangements to do so.  Finally, though the technology is pretty good, we cannot assure that it will always work on either your or our end, and in the setting of a video visit, we may have to convert it to a phone-only visit.  In either situation, we cannot ensure that we have a secure connection.  Are you willing to proceed?" STAFF: Did the patient verbally acknowledge consent to telehealth visit? Document YES/NO  here: YES  4. Advise patient to be prepared - "Two hours prior to your appointment, go ahead and check your blood pressure, pulse, oxygen saturation, and your weight (if you have the equipment to check those) and write them all down. When your visit starts, your provider will ask you for this information. If you have an Apple Watch or Kardia device, please plan to have heart rate information ready on the day of your appointment. Please have a pen and paper handy nearby the day of the visit as well."  5. Give patient instructions for MyChart download to smartphone OR [email or web address] as below if video visit (depending on what platform provider is using)  6. Inform patient they will receive a phone call 15 minutes prior to their appointment time (may be from unknown caller ID) so they should be prepared to answer    TELEPHONE CALL NOTE  Brandy [name] has been deemed a candidate for a follow-up tele-health visit to limit community exposure during the Covid-19 pandemic. I spoke with the patient via phone to ensure availability of phone/video source, confirm preferred email & phone number, and discuss instructions and expectations.  I reminded Brandy [name] to be prepared with any vital sign and/or heart rhythm information that could potentially be obtained via home monitoring, at the time of her visit. I reminded Brandy [name] to expect a phone call prior to her  visit.  Harold Hedge, CMA 07/25/2018 10:21 AM   INSTRUCTIONS FOR DOWNLOADING THE MYCHART APP TO SMARTPHONE  - The patient must first make sure to have activated MyChart and know their login information - If Apple, go to [employer] and type in MyChart in the search bar and download the app. If Android, ask patient to go to [employer] and type in [place] in the search bar and download the app. The app is free but as with any other app downloads, their phone may require them to verify saved payment information or  Apple/Android password.  - The patient will need to then log into the app with their MyChart username and password, and select [facility] as their healthcare provider to link the account. When it is time for your visit, go to the MyChart app, find appointments, and click Begin Video Visit. Be sure to Select Allow for your device to access the Microphone and Camera for your visit. You will then be connected, and your provider will be with you shortly.  **If they have any issues connecting, or need assistance please contact MyChart service desk [phone])**  **If using a computer, in order to ensure the best quality for their visit they will need to use either of the following Internet Browsers: [employer], or Google Chrome**  IF USING DOXIMITY or DOXY.ME - The patient will receive a link just prior to their visit by text.     FULL LENGTH CONSENT FOR TELE-HEALTH VISIT   I hereby voluntarily request, consent and authorize [facility] and its employed or contracted physicians, physician assistants, nurse practitioners or other licensed health care professionals (the Practitioner), to provide me with telemedicine health care services (the "Services") as deemed necessary by the treating Practitioner. I acknowledge and consent to receive the Services by the Practitioner via telemedicine. I understand that the telemedicine visit will involve communicating with the Practitioner through live audiovisual communication technology and the disclosure of certain medical information by electronic transmission. I acknowledge that I have been given the opportunity to request an in-person assessment or other available alternative prior to the telemedicine visit and am voluntarily participating in the telemedicine visit.  I understand that I have the right to withhold or withdraw my consent to the use of telemedicine in the course of my care at any time, without affecting my right to future care  or treatment, and that the Practitioner or I may terminate the telemedicine visit at any time. I understand that I have the right to inspect all information obtained and/or recorded in the course of the telemedicine visit and may receive copies of available information for a reasonable fee.  I understand that some of the potential risks of receiving the Services via telemedicine include:  Marland Kitchen Delay or interruption in medical evaluation due to technological equipment failure or disruption; . Information transmitted may not be sufficient (e.g. poor resolution of images) to allow for appropriate medical decision making by the Practitioner; and/or  . In rare instances, security protocols could fail, causing a breach of personal health information.  Furthermore, I acknowledge that it is my responsibility to provide information about my medical history, conditions and care that is complete and accurate to the best of my ability. I acknowledge that Practitioner's advice, recommendations, and/or decision may be based on factors not within their control, such as incomplete or inaccurate data provided by me or distortions of diagnostic images or specimens that may result from electronic transmissions. I  understand that the practice of medicine is not an exact science and that Practitioner makes no warranties or guarantees regarding treatment outcomes. I acknowledge that I will receive a copy of this consent concurrently upon execution via email to the email address I last provided but may also request a printed copy by calling the office of [facility].    I understand that my insurance will be billed for this visit.   I have read or had this consent read to me. . I understand the contents of this consent, which adequately explains the benefits and risks of the Services being provided via telemedicine.  . I have been provided ample opportunity to ask questions regarding this consent and the Services and have had  my questions answered to my satisfaction. . I give my informed consent for the services to be provided through the use of telemedicine in my medical care  By participating in this telemedicine visit I agree to the above.

## 2018-07-29 ENCOUNTER — Telehealth: Payer: Self-pay | Admitting: Cardiovascular Disease

## 2018-07-29 NOTE — Telephone Encounter (Signed)
Smartphone [phone] my chart/ consent/ pre reg completed

## 2018-07-30 ENCOUNTER — Ambulatory Visit (INDEPENDENT_AMBULATORY_CARE_PROVIDER_SITE_OTHER): Payer: BC Managed Care – PPO | Admitting: Psychology

## 2018-07-30 ENCOUNTER — Telehealth (INDEPENDENT_AMBULATORY_CARE_PROVIDER_SITE_OTHER): Payer: BC Managed Care – PPO | Admitting: Cardiovascular Disease

## 2018-07-30 DIAGNOSIS — I442 Atrioventricular block, complete: Secondary | ICD-10-CM | POA: Diagnosis not present

## 2018-07-30 DIAGNOSIS — F329 Major depressive disorder, single episode, unspecified: Secondary | ICD-10-CM | POA: Diagnosis not present

## 2018-07-30 DIAGNOSIS — Z950 Presence of cardiac pacemaker: Secondary | ICD-10-CM | POA: Diagnosis not present

## 2018-07-30 DIAGNOSIS — E7800 Pure hypercholesterolemia, unspecified: Secondary | ICD-10-CM

## 2018-07-30 DIAGNOSIS — F4323 Adjustment disorder with mixed anxiety and depressed mood: Secondary | ICD-10-CM

## 2018-07-30 DIAGNOSIS — I480 Paroxysmal atrial fibrillation: Secondary | ICD-10-CM

## 2018-07-30 DIAGNOSIS — I4729 Other ventricular tachycardia: Secondary | ICD-10-CM

## 2018-07-30 DIAGNOSIS — I472 Ventricular tachycardia: Secondary | ICD-10-CM

## 2018-07-30 MED ORDER — METOPROLOL SUCCINATE ER 50 MG PO TB24: 50.0000 mg | ORAL_TABLET | Freq: Every day | ORAL | 3 refills | Status: DC

## 2018-07-30 NOTE — Progress Notes (Signed)
Virtual Visit via Video Note   This visit type was conducted due to national recommendations for restrictions regarding the COVID-19 Pandemic (e.g. social distancing) in an effort to limit this patient's exposure and mitigate transmission in our community.  Due to her co-morbid illnesses, this patient is at least at moderate risk for complications without adequate follow up.  This format is felt to be most appropriate for this patient at this time.  All issues noted in this document were discussed and addressed.  A limited physical exam was performed with this format.  Please refer to the patient's chart for her consent to telehealth for [facility].   Evaluation Performed:  Follow-up visit  Date:  07/30/2018   ID:  Brandy [name], DOB Nov 24, 1955, MRN [id number]  Patient Location: Home Provider Location: Home  PCP:  Esaw Grandchild, NP  Cardiologist:  Meshelle Holness Electrophysiologist:  None   Chief Complaint:  Pacemaker/atrial fibrillation followup  History of Present Illness:    Brandy [name] is a 63 y.o. female with longstanding complete heart block status post dual-chamber permanent pacemaker and incidentally discovered paroxysmal atrial fibrillation and nonsustained ventricular tachycardia (both asymptomatic, detected by her device).  3 months have passed since her husband Brandy [name] died from lung cancer.  She is living alone and taking care of her own household including the yard, without any cardiovascular complaints.  Her daughter Brandy [name] who is a Press photographer, lives just across the field from her.  The patient specifically denies any chest pain at rest exertion, dyspnea at rest or with exertion, orthopnea, paroxysmal nocturnal dyspnea, syncope, palpitations, focal neurological deficits, intermittent claudication, lower extremity edema, unexplained weight gain, cough, hemoptysis or wheezing.  She had one episode of dizziness while walking at a slow pace the other day.  She ascribed it  to allergies. This happened last week, after she had performed her most recent pacemaker download on April 9.  The pacemaker download shows normal device function.  The burden of atrial fibrillation was only 0.3%, with the longest episode lasting for 3 hours.  As before she has had occasional episodes of nonsustained VT, 1 of which was a little longer at 16 beats duration.  Lead parameters are normal. Estimated generator longevity is just over 2 years.  She has virtually 100% atrial paced, ventricular paced rhythm.  She has normal left ventricular systolic function on echocardiogram performed in April 2019.  Coronary CT angiography performed in July 2019 showed a calcium score of 0 and no significant coronary stenoses with minimal plaque seen.  The patient does not have symptoms concerning for COVID-19 infection (fever, chills, cough, or new shortness of breath).    Past Medical History:  Diagnosis Date  . Allergic rhinitis   . Asthmatic bronchitis   . Complete heart block ([place])   . GERD (gastroesophageal reflux disease)   . MRSA (methicillin resistant Staphylococcus aureus) 2013   left buttock  . Nonsustained ventricular tachycardia ([place])   . 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 Vaginitis   . Vocal cord paresis    Past Surgical History:  Procedure Laterality Date  . ACROMIO-CLAVICULAR JOINT REPAIR Left 12/10/2014   Procedure: LEFT CORACOCLAVICULAR RECONSTRUCTION WITH  ZIPLOOP;  Surgeon: Tania Ade, MD;  Location: [place];  Service: Orthopedics;  Laterality: Left;  . APPENDECTOMY  05-2010  . DILATION AND CURETTAGE OF UTERUS  1978  . HAND SURGERY    . PACEMAKER INSERTION  08/05/08   Medtronic  Adapta  . Vocal cord splint  2005     Current Meds  Medication Sig  . ALPRAZolam (XANAX) 0.25 MG tablet Take 0.25 mg by mouth at bedtime as needed.  Marland Kitchen aspirin 81 MG tablet Take 81 mg by mouth daily.  . Calcium Carbonate-Vitamin D (CALCIUM  500 + D) 500-125 MG-UNIT TABS Take 1 tablet by mouth daily.    Marland Kitchen FLUoxetine (PROZAC) 20 MG capsule Take 1 capsule by mouth daily.  . fluticasone (FLONASE) 50 MCG/ACT nasal spray USE 1 SPRAY IN EACH NOSTRIL DAILY  . metoprolol succinate (TOPROL-XL) 50 MG 24 hr tablet Take 1 tablet (50 mg total) by mouth daily. Take with or immediately following a meal.  . valACYclovir (VALTREX) 1000 MG tablet Take 1 tablet by mouth daily as needed.  . [DISCONTINUED] metoprolol succinate (TOPROL-XL) 50 MG 24 hr tablet TAKE 1 TABLET BY MOUTH DAILY     Allergies:   Erythromycin and Sulfa antibiotics   Social History   Tobacco Use  . Smoking status: Never Smoker  . Smokeless tobacco: Never Used  Substance Use Topics  . Alcohol use: Not Currently  . Drug use: Never     Family Hx: The patient's family history includes Alcohol abuse in her paternal grandfather and sister; Cancer in her maternal grandmother; Coronary artery disease in her mother; Depression in her father, mother, and sister; Diabetes in her father, maternal grandfather, and paternal uncle; Heart disease in her father, maternal grandfather, mother, and paternal grandfather; Hypertension in her father, mother, and sister.  ROS:   Please see the history of present illness.     All other systems reviewed and are negative.   Prior CV studies:   The following studies were reviewed today:  Comprehensive pacemaker download from July 10, 2018, echo April 2019  Labs/Other Tests and Data Reviewed:    EKG:  An ECG dated 07/11/2017 was personally reviewed today and demonstrated:  AV sequential pacing  Recent Labs: 03/28/2018: ALT 16; BUN 19; Creatinine, Ser 1.10; Hemoglobin 12.9; Platelets 261; Potassium 4.6; Sodium 144; TSH 2.120   Recent Lipid Panel Lab Results  Component Value Date/Time   CHOL 215 (H) 03/28/2018 08:58 AM   TRIG 101 03/28/2018 08:58 AM   HDL 61 03/28/2018 08:58 AM   CHOLHDL 3.5 03/28/2018 08:58 AM   LDLCALC 134 (H)  03/28/2018 08:58 AM    Wt Readings from Last 3 Encounters:  07/30/18 142 lb (64.4 kg)  04/01/18 148 lb 11.2 oz (67.4 kg)  02/21/18 151 lb 1.6 oz (68.5 kg)     Objective:    Vital Signs:  BP 110/61   Pulse 86   Ht 5\' 7"  (1.702 m)   Wt 142 lb (64.4 kg)   SpO2 99%   BMI 22.24 kg/m    VITAL SIGNS:  reviewed GEN:  no acute distress EYES:  sclerae anicteric, EOMI - Extraocular Movements Intact RESPIRATORY:  normal respiratory effort, symmetric expansion CARDIOVASCULAR:  no peripheral edema SKIN:  no rash, lesions or ulcers. MUSCULOSKELETAL:  no obvious deformities. NEURO:  alert and oriented x 3, no obvious focal deficit PSYCH:  normal affect  ASSESSMENT & PLAN:    1. CHB: Pacemaker dependent, but normal device function.  No reason to believe her dizziness was related to an episode of malfunction.  Reiterated the need to avoid strong electromagnetic fields. 2. NSVT: In the past these have always appears to be asymptomatic.  I asked her to keep a log of her episodes of dizziness or near syncope  so we can compare with recorded VT events.  Normal left ventricular systolic function and no history of structural heart disease make it less likely that the VT is clinically important.  She should continue taking beta-blockers. 3. AFib: Low burden of arrhythmia and not hemodynamically important since she has complete heart block.CHADSVasc 1 (gender only), so has not been prescribed anticoagulants. 4. PM: Continue downloads every 3 months and yearly office visit.  COVID-19 Education: The signs and symptoms of COVID-19 were discussed with the patient and how to seek care for testing (follow up with PCP or arrange E-visit).  The importance of social distancing was discussed today.  Time:   Today, I have spent 18 minutes with the patient with telehealth technology discussing the above problems.     Medication Adjustments/Labs and Tests Ordered: Current medicines are reviewed at length with  the patient today.  Concerns regarding medicines are outlined above.   Tests Ordered: No orders of the defined types were placed in this encounter.   Medication Changes: Meds ordered this encounter  Medications  . metoprolol succinate (TOPROL-XL) 50 MG 24 hr tablet    Sig: Take 1 tablet (50 mg total) by mouth daily. Take with or immediately following a meal.    Dispense:  90 tablet    Refill:  3    Disposition:  Follow up 12 months  Signed, Sanda Klein, MD  07/30/2018 10:31 AM    [place]

## 2018-07-30 NOTE — Patient Instructions (Signed)
Medication Instructions:  Continue same medications  90 day refill for Toprol sent to pharmacy If you need a refill on your cardiac medications before your next appointment, please call your pharmacy.   Lab work: None ordered   Testing/Procedures: None ordered  Follow-Up: At [facility], you and your health needs are our priority.  As part of our continuing mission to provide you with exceptional heart care, we have created designated Provider Care Teams.  These Care Teams include your primary Cardiologist (physician) and Advanced Practice Providers (APPs -  Physician Assistants and Nurse Practitioners) who all work together to provide you with the care you need, when you need it. . Schedule follow up appointment with Dr.Croitoru in 12 months    Call 3 months before to schedule

## 2018-08-12 ENCOUNTER — Ambulatory Visit (INDEPENDENT_AMBULATORY_CARE_PROVIDER_SITE_OTHER): Payer: BC Managed Care – PPO | Admitting: Psychology

## 2018-08-12 DIAGNOSIS — F4323 Adjustment disorder with mixed anxiety and depressed mood: Secondary | ICD-10-CM

## 2018-08-12 DIAGNOSIS — F329 Major depressive disorder, single episode, unspecified: Secondary | ICD-10-CM | POA: Diagnosis not present

## 2018-08-21 ENCOUNTER — Ambulatory Visit (INDEPENDENT_AMBULATORY_CARE_PROVIDER_SITE_OTHER): Payer: BC Managed Care – PPO | Admitting: Psychology

## 2018-08-21 DIAGNOSIS — F4323 Adjustment disorder with mixed anxiety and depressed mood: Secondary | ICD-10-CM | POA: Diagnosis not present

## 2018-08-21 DIAGNOSIS — F329 Major depressive disorder, single episode, unspecified: Secondary | ICD-10-CM | POA: Diagnosis not present

## 2018-09-16 ENCOUNTER — Ambulatory Visit (INDEPENDENT_AMBULATORY_CARE_PROVIDER_SITE_OTHER): Payer: BC Managed Care – PPO | Admitting: Psychology

## 2018-09-16 DIAGNOSIS — F329 Major depressive disorder, single episode, unspecified: Secondary | ICD-10-CM

## 2018-09-16 DIAGNOSIS — F4323 Adjustment disorder with mixed anxiety and depressed mood: Secondary | ICD-10-CM | POA: Diagnosis not present

## 2018-10-09 ENCOUNTER — Ambulatory Visit (INDEPENDENT_AMBULATORY_CARE_PROVIDER_SITE_OTHER): Payer: BC Managed Care – PPO | Admitting: *Deleted

## 2018-10-09 DIAGNOSIS — I442 Atrioventricular block, complete: Secondary | ICD-10-CM | POA: Diagnosis not present

## 2018-10-09 LAB — CUP PACEART REMOTE DEVICE CHECK
Battery Impedance: 2140 Ohm
Battery Remaining Longevity: 27 mo
Battery Voltage: 2.75 V
Brady Statistic AP VP Percent: 86 %
Brady Statistic AP VS Percent: 0 %
Brady Statistic AS VP Percent: 13 %
Brady Statistic AS VS Percent: 0 %
Date Time Interrogation Session: 20200708173543
Implantable Lead Implant Date: 20100505
Implantable Lead Implant Date: 20100505
Implantable Lead Location: 753859
Implantable Lead Location: 753860
Implantable Lead Model: 5076
Implantable Lead Model: 5076
Implantable Pulse Generator Implant Date: 20100505
Lead Channel Impedance Value: 467 Ohm
Lead Channel Impedance Value: 590 Ohm
Lead Channel Pacing Threshold Amplitude: 0.5 V
Lead Channel Pacing Threshold Amplitude: 0.625 V
Lead Channel Pacing Threshold Pulse Width: 0.4 ms
Lead Channel Pacing Threshold Pulse Width: 0.4 ms
Lead Channel Setting Pacing Amplitude: 2 V
Lead Channel Setting Pacing Amplitude: 2.5 V
Lead Channel Setting Pacing Pulse Width: 0.4 ms
Lead Channel Setting Sensing Sensitivity: 2 mV

## 2018-10-21 ENCOUNTER — Encounter: Payer: Self-pay | Admitting: Cardiology

## 2018-10-21 NOTE — Progress Notes (Signed)
Remote pacemaker transmission.   

## 2018-12-02 ENCOUNTER — Other Ambulatory Visit: Payer: Self-pay

## 2018-12-02 ENCOUNTER — Encounter: Payer: Self-pay | Admitting: Adult Health

## 2018-12-02 ENCOUNTER — Telehealth: Payer: Self-pay

## 2018-12-02 ENCOUNTER — Ambulatory Visit (INDEPENDENT_AMBULATORY_CARE_PROVIDER_SITE_OTHER): Payer: BC Managed Care – PPO | Admitting: Adult Health

## 2018-12-02 ENCOUNTER — Other Ambulatory Visit: Payer: Self-pay | Admitting: Adult Health

## 2018-12-02 VITALS — BP 119/66 | HR 80 | Temp 98.4°F | Ht 66.25 in | Wt 147.3 lb

## 2018-12-02 DIAGNOSIS — L259 Unspecified contact dermatitis, unspecified cause: Secondary | ICD-10-CM | POA: Diagnosis not present

## 2018-12-02 DIAGNOSIS — L239 Allergic contact dermatitis, unspecified cause: Secondary | ICD-10-CM | POA: Diagnosis not present

## 2018-12-02 MED ORDER — PREDNISONE 20 MG PO TABS: ORAL_TABLET | ORAL | 0 refills | Status: DC

## 2018-12-02 MED ORDER — METHYLPREDNISOLONE ACETATE 40 MG/ML IJ SUSP
40.0000 mg | Freq: Once | INTRAMUSCULAR | Status: AC
  Administered 2018-12-02: 40 mg via INTRAMUSCULAR

## 2018-12-02 NOTE — Telephone Encounter (Signed)
Received fax from [facility] that RX for prednisone was sent in for #13.  However, given the directions, the correct quantity would be #29.  Please verify if this is the correct quantity or if the sig is incorrect.  Charyl Bigger, CMA

## 2018-12-02 NOTE — Telephone Encounter (Signed)
Corrected

## 2018-12-02 NOTE — Patient Instructions (Addendum)
Contact Dermatitis Dermatitis is redness, soreness, and swelling (inflammation) of the skin. Contact dermatitis is a reaction to certain substances that touch the skin. Many different substances can cause contact dermatitis. There are two types of contact dermatitis:  Irritant contact dermatitis. This type is caused by something that irritates your skin, such as having dry hands from washing them too often with soap. This type does not require previous exposure to the substance for a reaction to occur. This is the most common type.  Allergic contact dermatitis. This type is caused by a substance that you are allergic to, such as poison ivy. This type occurs when you have been exposed to the substance (allergen) and develop a sensitivity to it. Dermatitis may develop soon after your first exposure to the allergen, or it may not develop until the next time you are exposed and every time thereafter. What are the causes? Irritant contact dermatitis is most commonly caused by exposure to:  Makeup.  Soaps.  Detergents.  Bleaches.  Acids.  Metal salts, such as nickel. Allergic contact dermatitis is most commonly caused by exposure to:  Poisonous plants.  Chemicals.  Jewelry.  Latex.  Medicines.  Preservatives in products, such as clothing. What increases the risk? You are more likely to develop this condition if you have:  A job that exposes you to irritants or allergens.  Certain medical conditions, such as asthma or eczema. What are the signs or symptoms? Symptoms of this condition may occur on your body anywhere the irritant has touched you or is touched by you.  Symptoms include: ? Dryness or flaking. ? Redness. ? Cracks. ? Itching. ? Pain or a burning feeling. ? Blisters. ? Drainage of small amounts of blood or clear fluid from skin cracks. With allergic contact dermatitis, there may also be swelling in areas such as the eyelids, mouth, or genitals. How is this  diagnosed? This condition is diagnosed with a medical history and physical exam.  A patch skin test may be performed to help determine the cause.  If the condition is related to your job, you may need to see an occupational medicine specialist. How is this treated? This condition is treated by checking for the cause of the reaction and protecting your skin from further contact. Treatment may also include:  Steroid creams or ointments. Oral steroid medicines may be needed in more severe cases.  Antibiotic medicines or antibacterial ointments, if a skin infection is present.  Antihistamine lotion or an antihistamine taken by mouth to ease itching.  A bandage (dressing). Follow these instructions at home: Skin care  Moisturize your skin as needed.  Apply cool compresses to the affected areas.  Try applying baking soda paste to your skin. Stir water into baking soda until it reaches a paste-like consistency.  Do not scratch your skin, and avoid friction to the affected area.  Avoid the use of soaps, perfumes, and dyes. Medicines  Take or apply over-the-counter and prescription medicines only as told by your health care provider.  If you were prescribed an antibiotic medicine, take or apply the antibiotic as told by your health care provider. Do not stop using the antibiotic even if your condition improves. Bathing  Try taking a bath with: ? Epsom salts. Follow the instructions on the packaging. You can get these at your local pharmacy or grocery store. ? Baking soda. Pour a small amount into the bath as directed by your health care provider. ? Colloidal oatmeal. Follow the instructions on the   packaging. You can get this at your local pharmacy or grocery store.  Bathe less frequently, such as every other day.  Bathe in lukewarm water. Avoid using hot water. Bandage care  If you were given a bandage (dressing), change it as told by your health care provider.  Wash your hands  with soap and water before and after you change your dressing. If soap and water are not available, use hand sanitizer. General instructions  Avoid the substance that caused your reaction. If you do not know what caused it, keep a journal to try to track what caused it. Write down: ? What you eat. ? What cosmetic products you use. ? What you drink. ? What you wear in the affected area. This includes jewelry.  More redness, swelling, or pain.  More fluid or blood.  Warmth.  Pus or a bad smell.  Keep all follow-up visits as told by your health care provider. This is important. Contact a health care provider if:  Your condition does not improve with treatment.  Your condition gets worse.  You have signs of infection such as swelling, tenderness, redness, soreness, or warmth in the affected area.  You have a fever.  You have new symptoms. Get help right away if:  You have a severe headache, neck pain, or neck stiffness.  You vomit.  You feel very sleepy.  You notice red streaks coming from the affected area.  Your bone or joint underneath the affected area becomes painful after the skin has healed.  The affected area turns darker.  You have difficulty breathing. Summary  Dermatitis is redness, soreness, and swelling (inflammation) of the skin. Contact dermatitis is a reaction to certain substances that touch the skin.  Symptoms of this condition may occur on your body anywhere the irritant has touched you or is touched by you.  This condition is treated by figuring out what caused the reaction and protecting your skin from further contact. Treatment may also include medicines and skin care.  Avoid the substance that caused your reaction. If you do not know what caused it, keep a journal to try to track what caused it.  Contact a health care provider if your condition gets worse or you have signs of infection such as swelling, tenderness, redness, soreness, or warmth  in the affected area. This information is not intended to replace advice given to you by your health care provider. Make sure you discuss any questions you have with your health care provider. Document Released: 03/17/2000 Document Revised: 07/10/2018 Document Reviewed: 10/03/2017 Elsevier Patient Education  2020 [place] were given a steroid injection in clinic today, therefore do not start oral steroid until tomorrow 12/03/2018. Please follow Prednisone taper per orders. Use non-sedating OTC anti-histamine in morning and OTC Benadryl in evening if needed. Recommend OTC Hydrocortisone cream to also help relieve itch. Please  Call clinic if you notice any signs of infection. Remain well hydrated. Continue to social distance and wear a mask when in public. So very sorry for the loss of your husband. FEEL BETTER!

## 2018-12-02 NOTE — Progress Notes (Signed)
Subjective:    Patient ID: Brandy [name], female    DOB: February 02, 1956, 63 y.o.   MRN: [id number]  HPI:  Brandy [name] presents with contact dermatitis that developed approx. one week ago after a day of yard work. She was wearing tall socks and work boots, however states "I must have touched something in the yard".  She reports rash developed on her feet that has now spread to both lower extremities, abdomen, underneath both breasts, and upper posterior back. She reports constant, diffuse itching. She reports clear drainage from a few areas. She denies purulent drainage. She denies fever/night sweats/malaise/change in appetite She reports removing one small, flat tick a few weeks ago. She denies bulls eye rash or joint pain. She has been using OTC antihistamines and cool baths- reports only minimal sx relief Of Note- her husband passes away 05/27/18 from cancer She reports excellent support system   Patient Care Team    Relationship Specialty Notifications Start End  Esaw Grandchild, NP PCP - General Family Medicine  02/21/18   Gastroenterology, [facility]  Gastroenterology  02/25/18   [place], [facility]    02/25/18     Patient Active Problem List   Diagnosis Date Noted  . Contact dermatitis 12/02/2018  . Elevated LDL cholesterol level 04/01/2018  . Decreased GFR 04/01/2018  . Healthcare maintenance 02/21/2018  . Depression, major, recurrent, moderate ([place]) 02/21/2018  . Paroxysmal atrial fibrillation ([place]) 06/23/2016  . Closed fracture of distal clavicle 12/10/2014  . CHB (complete heart block) ([place]) 06/09/2013  . Pacemaker - Medtronic dual-chamber implanted 2010 06/09/2013  . NSVT (nonsustained ventricular tachycardia) ([place]) 06/09/2013  . Acute laryngitis and tracheitis 12/15/2012  . Acute upper respiratory infections of unspecified site 06/10/2012  . Anxiety attack 06/12/2011  . RHINOSINUSITIS, ACUTE 12/28/2009  . CANDIDIASIS OF THE ESOPHAGUS 10/02/2007  .  VAGINITIS, RECURRENT 10/02/2007  . VAGINAL DISCHARGE 10/02/2007  . ASTHMATIC BRONCHITIS, ACUTE 06/05/2007  . Seasonal allergic rhinitis 06/05/2007  . Vocal cord paralysis 06/05/2007  . Esophageal reflux 06/05/2007     Past Medical History:  Diagnosis Date  . Allergic rhinitis   . Asthmatic bronchitis   . Complete heart block ([place])   . GERD (gastroesophageal reflux disease)   . MRSA (methicillin resistant Staphylococcus aureus) 2013   left buttock  . Nonsustained ventricular tachycardia ([place])   . 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 Vaginitis   . Vocal cord paresis      Past Surgical History:  Procedure Laterality Date  . ACROMIO-CLAVICULAR JOINT REPAIR Left 12/10/2014   Procedure: LEFT CORACOCLAVICULAR RECONSTRUCTION WITH  ZIPLOOP;  Surgeon: Tania Ade, MD;  Location: [place];  Service: Orthopedics;  Laterality: Left;  . APPENDECTOMY  05-2010  . DILATION AND CURETTAGE OF UTERUS  1978  . HAND SURGERY    . PACEMAKER INSERTION  08/05/08   Medtronic Adapta  . Vocal cord splint  2005     Family History  Problem Relation Age of Onset  . Hypertension Mother   . Coronary artery disease Mother   . Depression Mother   . Heart disease Mother   . Diabetes Father   . Heart disease Father   . Depression Father   . Hypertension Father   . Hypertension Sister   . Alcohol abuse Sister   . Depression Sister   . Diabetes Paternal Uncle   . Cancer Maternal Grandmother         colon  .  Heart disease Maternal Grandfather   . Diabetes Maternal Grandfather   . Alcohol abuse Paternal Grandfather   . Heart disease Paternal Grandfather      Social History   Substance and Sexual Activity  Drug Use Never     Social History   Substance and Sexual Activity  Alcohol Use Not Currently     Social History   Tobacco Use  Smoking Status Never Smoker  Smokeless Tobacco Never Used     Outpatient Encounter Medications as of  12/02/2018  Medication Sig  . ALPRAZolam (XANAX) 0.25 MG tablet Take 0.25 mg by mouth at bedtime as needed.  Marland Kitchen aspirin 81 MG tablet Take 81 mg by mouth daily.  . Calcium Carbonate-Vitamin D (CALCIUM 500 + D) 500-125 MG-UNIT TABS Take 1 tablet by mouth daily.    Marland Kitchen FLUoxetine (PROZAC) 20 MG capsule Take 1 capsule by mouth daily.  . fluticasone (FLONASE) 50 MCG/ACT nasal spray USE 1 SPRAY IN EACH NOSTRIL DAILY  . metoprolol succinate (TOPROL-XL) 50 MG 24 hr tablet Take 1 tablet (50 mg total) by mouth daily. Take with or immediately following a meal.  . valACYclovir (VALTREX) 1000 MG tablet Take 1 tablet by mouth daily as needed.  . predniSONE (DELTASONE) 20 MG tablet 3 tabs every 8 hrs for 2 days, 2 tabs every 12 hrs for 2 days, 1 pill for 2 days, 1/2 pill for 2 days  . [EXPIRED] methylPREDNISolone acetate (DEPO-MEDROL) injection 40 mg    No facility-administered encounter medications on file as of 12/02/2018.     Allergies: Erythromycin and Sulfa antibiotics  Body mass index is 23.6 kg/m.  Blood pressure 119/66, pulse 80, temperature 98.4 F (36.9 C), temperature source Oral, height 5' 6.25" (1.683 m), weight 147 lb 4.8 oz (66.8 kg), SpO2 98 %.   Review of Systems  Constitutional: Positive for fatigue. Negative for activity change, appetite change, chills, diaphoresis, fever and unexpected weight change.  HENT: Negative for congestion.   Eyes: Negative for visual disturbance.  Respiratory: Negative for cough, chest tightness, shortness of breath and wheezing.   Cardiovascular: Negative for chest pain, palpitations and leg swelling.  Gastrointestinal: Negative for abdominal distention, abdominal pain, blood in stool, constipation, nausea and vomiting.  Endocrine: Negative for cold intolerance, heat intolerance, polydipsia, polyphagia and polyuria.  Musculoskeletal: Negative for arthralgias, joint swelling and myalgias.  Skin: Positive for color change and rash. Negative for pallor and  wound.  Neurological: Negative for dizziness and headaches.  Hematological: Negative for adenopathy. Does not bruise/bleed easily.  Psychiatric/Behavioral: Positive for sleep disturbance.       Objective:   Physical Exam Vitals signs and nursing note reviewed.  Constitutional:      Appearance: Normal appearance. She is normal weight.  HENT:     Head: Normocephalic and atraumatic.  Neck:     Musculoskeletal: Normal range of motion and neck supple.  Cardiovascular:     Rate and Rhythm: Normal rate and regular rhythm.     Pulses: Normal pulses.     Heart sounds: Normal heart sounds. No murmur. No friction rub. No gallop.   Pulmonary:     Effort: Pulmonary effort is normal. No respiratory distress.     Breath sounds: Normal breath sounds. No stridor. No wheezing, rhonchi or rales.  Chest:     Chest wall: No tenderness.  Skin:    Capillary Refill: Capillary refill takes less than 2 seconds.     Findings: Rash present. Rash is vesicular.  Neurological:     Mental Status: She is alert.  Psychiatric:        Mood and Affect: Mood normal.        Behavior: Behavior normal.        Thought Content: Thought content normal.        Judgment: Judgment normal.        Assessment & Plan:   1. Allergic contact dermatitis, unspecified trigger   2. Contact dermatitis, unspecified contact dermatitis type, unspecified trigger     Contact dermatitis  She reports tolerating Steroids well in past Lab Results  Component Value Date   HGBA1C 5.9 (H) 03/28/2018   You were given a steroid injection in clinic today, therefore do not start oral steroid until tomorrow 12/03/2018. Please follow Prednisone taper per orders. Use non-sedating OTC anti-histamine in morning and OTC Benadryl in evening if needed. Recommend OTC Hydrocortisone cream to also help relieve itch. Please  Call clinic if you notice any signs of infection. Remain well hydrated. Continue to social distance and wear a mask  when in public. So very sorry for the loss of your husband.    FOLLOW-UP:  Return if symptoms worsen or fail to improve.

## 2018-12-02 NOTE — Assessment & Plan Note (Addendum)
  She reports tolerating Steroids well in past Lab Results  Component Value Date   HGBA1C 5.9 (H) 03/28/2018   You were given a steroid injection in clinic today, therefore do not start oral steroid until tomorrow 12/03/2018. Please follow Prednisone taper per orders. Use non-sedating OTC anti-histamine in morning and OTC Benadryl in evening if needed. Recommend OTC Hydrocortisone cream to also help relieve itch. Please  Call clinic if you notice any signs of infection. Remain well hydrated. Continue to social distance and wear a mask when in public. So very sorry for the loss of your husband.

## 2019-01-08 ENCOUNTER — Ambulatory Visit (INDEPENDENT_AMBULATORY_CARE_PROVIDER_SITE_OTHER): Payer: BC Managed Care – PPO | Admitting: *Deleted

## 2019-01-08 DIAGNOSIS — I442 Atrioventricular block, complete: Secondary | ICD-10-CM

## 2019-01-08 DIAGNOSIS — I480 Paroxysmal atrial fibrillation: Secondary | ICD-10-CM

## 2019-01-09 LAB — CUP PACEART REMOTE DEVICE CHECK
Battery Impedance: 2186 Ohm
Battery Remaining Longevity: 26 mo
Battery Voltage: 2.74 V
Brady Statistic AP VP Percent: 86 %
Brady Statistic AP VS Percent: 0 %
Brady Statistic AS VP Percent: 13 %
Brady Statistic AS VS Percent: 0 %
Date Time Interrogation Session: 20201007132849
Implantable Lead Implant Date: 20100505
Implantable Lead Implant Date: 20100505
Implantable Lead Location: 753859
Implantable Lead Location: 753860
Implantable Lead Model: 5076
Implantable Lead Model: 5076
Implantable Pulse Generator Implant Date: 20100505
Lead Channel Impedance Value: 451 Ohm
Lead Channel Impedance Value: 628 Ohm
Lead Channel Pacing Threshold Amplitude: 0.5 V
Lead Channel Pacing Threshold Amplitude: 0.625 V
Lead Channel Pacing Threshold Pulse Width: 0.4 ms
Lead Channel Pacing Threshold Pulse Width: 0.4 ms
Lead Channel Setting Pacing Amplitude: 2 V
Lead Channel Setting Pacing Amplitude: 2.5 V
Lead Channel Setting Pacing Pulse Width: 0.4 ms
Lead Channel Setting Sensing Sensitivity: 2 mV

## 2019-01-20 NOTE — Progress Notes (Signed)
Remote pacemaker transmission.   

## 2019-04-09 ENCOUNTER — Ambulatory Visit (INDEPENDENT_AMBULATORY_CARE_PROVIDER_SITE_OTHER): Payer: PPO | Admitting: *Deleted

## 2019-04-09 DIAGNOSIS — I442 Atrioventricular block, complete: Secondary | ICD-10-CM | POA: Diagnosis not present

## 2019-04-10 LAB — CUP PACEART REMOTE DEVICE CHECK
Battery Impedance: 2377 Ohm
Battery Remaining Longevity: 24 mo
Battery Voltage: 2.74 V
Brady Statistic AP VP Percent: 86 %
Brady Statistic AP VS Percent: 0 %
Brady Statistic AS VP Percent: 13 %
Brady Statistic AS VS Percent: 0 %
Date Time Interrogation Session: 20210106180445
Implantable Lead Implant Date: 20100505
Implantable Lead Implant Date: 20100505
Implantable Lead Location: 753859
Implantable Lead Location: 753860
Implantable Lead Model: 5076
Implantable Lead Model: 5076
Implantable Pulse Generator Implant Date: 20100505
Lead Channel Impedance Value: 489 Ohm
Lead Channel Impedance Value: 626 Ohm
Lead Channel Pacing Threshold Amplitude: 0.5 V
Lead Channel Pacing Threshold Amplitude: 0.625 V
Lead Channel Pacing Threshold Pulse Width: 0.4 ms
Lead Channel Pacing Threshold Pulse Width: 0.4 ms
Lead Channel Setting Pacing Amplitude: 2 V
Lead Channel Setting Pacing Amplitude: 2.5 V
Lead Channel Setting Pacing Pulse Width: 0.4 ms
Lead Channel Setting Sensing Sensitivity: 2 mV

## 2019-05-22 DIAGNOSIS — F99 Mental disorder, not otherwise specified: Secondary | ICD-10-CM | POA: Diagnosis not present

## 2019-05-29 DIAGNOSIS — F99 Mental disorder, not otherwise specified: Secondary | ICD-10-CM | POA: Diagnosis not present

## 2019-06-05 DIAGNOSIS — F330 Major depressive disorder, recurrent, mild: Secondary | ICD-10-CM | POA: Diagnosis not present

## 2019-06-12 DIAGNOSIS — F4323 Adjustment disorder with mixed anxiety and depressed mood: Secondary | ICD-10-CM | POA: Diagnosis not present

## 2019-06-19 DIAGNOSIS — F4323 Adjustment disorder with mixed anxiety and depressed mood: Secondary | ICD-10-CM | POA: Diagnosis not present

## 2019-06-26 DIAGNOSIS — L820 Inflamed seborrheic keratosis: Secondary | ICD-10-CM | POA: Diagnosis not present

## 2019-06-26 DIAGNOSIS — D485 Neoplasm of uncertain behavior of skin: Secondary | ICD-10-CM | POA: Diagnosis not present

## 2019-06-26 DIAGNOSIS — L821 Other seborrheic keratosis: Secondary | ICD-10-CM | POA: Diagnosis not present

## 2019-06-26 DIAGNOSIS — L812 Freckles: Secondary | ICD-10-CM | POA: Diagnosis not present

## 2019-07-03 DIAGNOSIS — F4323 Adjustment disorder with mixed anxiety and depressed mood: Secondary | ICD-10-CM | POA: Diagnosis not present

## 2019-07-09 ENCOUNTER — Ambulatory Visit (INDEPENDENT_AMBULATORY_CARE_PROVIDER_SITE_OTHER): Payer: PPO | Admitting: *Deleted

## 2019-07-09 DIAGNOSIS — I442 Atrioventricular block, complete: Secondary | ICD-10-CM

## 2019-07-10 LAB — CUP PACEART REMOTE DEVICE CHECK
Battery Impedance: 2812 Ohm
Battery Remaining Longevity: 20 mo
Battery Voltage: 2.73 V
Brady Statistic AP VP Percent: 87 %
Brady Statistic AP VS Percent: 0 %
Brady Statistic AS VP Percent: 12 %
Brady Statistic AS VS Percent: 0 %
Date Time Interrogation Session: 20210408103155
Implantable Lead Implant Date: 20100505
Implantable Lead Implant Date: 20100505
Implantable Lead Location: 753859
Implantable Lead Location: 753860
Implantable Lead Model: 5076
Implantable Lead Model: 5076
Implantable Pulse Generator Implant Date: 20100505
Lead Channel Impedance Value: 475 Ohm
Lead Channel Impedance Value: 602 Ohm
Lead Channel Pacing Threshold Amplitude: 0.5 V
Lead Channel Pacing Threshold Amplitude: 0.625 V
Lead Channel Pacing Threshold Pulse Width: 0.4 ms
Lead Channel Pacing Threshold Pulse Width: 0.4 ms
Lead Channel Setting Pacing Amplitude: 2 V
Lead Channel Setting Pacing Amplitude: 2.5 V
Lead Channel Setting Pacing Pulse Width: 0.4 ms
Lead Channel Setting Sensing Sensitivity: 2 mV

## 2019-07-14 DIAGNOSIS — H9113 Presbycusis, bilateral: Secondary | ICD-10-CM | POA: Diagnosis not present

## 2019-07-14 DIAGNOSIS — H903 Sensorineural hearing loss, bilateral: Secondary | ICD-10-CM | POA: Diagnosis not present

## 2019-07-17 DIAGNOSIS — F4323 Adjustment disorder with mixed anxiety and depressed mood: Secondary | ICD-10-CM | POA: Diagnosis not present

## 2019-07-31 DIAGNOSIS — F4323 Adjustment disorder with mixed anxiety and depressed mood: Secondary | ICD-10-CM | POA: Diagnosis not present

## 2019-08-13 ENCOUNTER — Ambulatory Visit (INDEPENDENT_AMBULATORY_CARE_PROVIDER_SITE_OTHER): Payer: PPO

## 2019-08-13 ENCOUNTER — Other Ambulatory Visit: Payer: Self-pay

## 2019-08-13 ENCOUNTER — Ambulatory Visit (INDEPENDENT_AMBULATORY_CARE_PROVIDER_SITE_OTHER): Payer: PPO | Admitting: Podiatry

## 2019-08-13 DIAGNOSIS — M21611 Bunion of right foot: Secondary | ICD-10-CM

## 2019-08-13 DIAGNOSIS — M2041 Other hammer toe(s) (acquired), right foot: Secondary | ICD-10-CM

## 2019-08-14 DIAGNOSIS — F4323 Adjustment disorder with mixed anxiety and depressed mood: Secondary | ICD-10-CM | POA: Diagnosis not present

## 2019-08-17 NOTE — Progress Notes (Signed)
   Subjective: 64 y.o. female presenting today as a new patient with a chief complaint of intermittent sharp pain to the right hallux that has been ongoing for the past 50 years. She reports a painful bunion and states the 2nd toe is crossing over the great toe. She reports associated toenail pain and painful corns on the 2nd toe. Wearing certain shoes and cold weather increases the pain. She has been taking Tylenol and Goody's Powder, icing and OTC corn pads for treatment. Patient is here for further evaluation and treatment.   Past Medical History:  Diagnosis Date  . Allergic rhinitis   . Asthmatic bronchitis   . Complete heart block ([place])   . GERD (gastroesophageal reflux disease)   . MRSA (methicillin resistant Staphylococcus aureus) 2011/06/26   left buttock  . Nonsustained ventricular tachycardia ([place])   . 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 Vaginitis   . Vocal cord paresis      Objective: Physical Exam General: The patient is alert and oriented x3 in no acute distress.  Dermatology: Skin is cool, dry and supple bilateral lower extremities. Negative for open lesions or macerations.  Vascular: Palpable pedal pulses bilaterally. No edema or erythema noted. Capillary refill within normal limits.  Neurological: Epicritic and protective threshold grossly intact bilaterally.   Musculoskeletal Exam: Clinical evidence of bunion deformity noted to the respective foot. There is moderate pain on palpation range of motion of the first MPJ. Lateral deviation of the hallux noted consistent with hallux abductovalgus. Hammertoe contracture also noted on clinical exam to the 2nd digit of the right foot. Symptomatic pain on palpation and range of motion also noted to the metatarsal phalangeal joints of the respective hammertoe digits.    Radiographic Exam: Increased intermetatarsal angle greater than 15 with a hallux abductus angle greater than 30 noted on AP view.  Moderate degenerative changes noted within the first MPJ. Contracture deformity also noted to the interphalangeal joints and MPJs of the digits of the respective hammertoes.    Assessment: 1. HAV w/ bunion deformity right 2. 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 Patient was evaluated. X-Rays reviewed. 2. Discussed surgical vs conservative treatment with patient.  3. Patient wants surgery in November of 2021.  4. Recommended good shoe gear.  5. Return to clinic in 4 months for surgical consult and to schedule surgery.   Retired. Husband passed away in 06/26/2018 from lung cancer. 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    Edrick Kins, DPM [facility]  Dr. Edrick Kins, [address]                                        [place], [place] [address]                Office [phone]  Fax [phone]

## 2019-08-21 ENCOUNTER — Emergency Department (HOSPITAL_COMMUNITY): Payer: PPO

## 2019-08-21 ENCOUNTER — Inpatient Hospital Stay (HOSPITAL_COMMUNITY)
Admission: EM | Admit: 2019-08-21 | Discharge: 2019-08-23 | DRG: 071 | Disposition: A | Discharge status: Home / Self Care | Payer: PPO | Attending: Internal Medicine | Admitting: Internal Medicine

## 2019-08-21 ENCOUNTER — Encounter (HOSPITAL_COMMUNITY): Payer: Self-pay | Admitting: *Deleted

## 2019-08-21 ENCOUNTER — Inpatient Hospital Stay (HOSPITAL_COMMUNITY): Payer: PPO

## 2019-08-21 ENCOUNTER — Other Ambulatory Visit: Payer: Self-pay

## 2019-08-21 DIAGNOSIS — Z9181 History of falling: Secondary | ICD-10-CM

## 2019-08-21 DIAGNOSIS — F329 Major depressive disorder, single episode, unspecified: Secondary | ICD-10-CM | POA: Diagnosis not present

## 2019-08-21 DIAGNOSIS — R531 Weakness: Secondary | ICD-10-CM | POA: Diagnosis not present

## 2019-08-21 DIAGNOSIS — Z20822 Contact with and (suspected) exposure to covid-19: Secondary | ICD-10-CM | POA: Diagnosis present

## 2019-08-21 DIAGNOSIS — I442 Atrioventricular block, complete: Secondary | ICD-10-CM | POA: Diagnosis not present

## 2019-08-21 DIAGNOSIS — Z950 Presence of cardiac pacemaker: Secondary | ICD-10-CM | POA: Diagnosis present

## 2019-08-21 DIAGNOSIS — Z818 Family history of other mental and behavioral disorders: Secondary | ICD-10-CM

## 2019-08-21 DIAGNOSIS — S199XXA Unspecified injury of neck, initial encounter: Secondary | ICD-10-CM | POA: Diagnosis not present

## 2019-08-21 DIAGNOSIS — Z800 Family history of malignant neoplasm of digestive organs: Secondary | ICD-10-CM | POA: Diagnosis not present

## 2019-08-21 DIAGNOSIS — Z79899 Other long term (current) drug therapy: Secondary | ICD-10-CM | POA: Diagnosis not present

## 2019-08-21 DIAGNOSIS — Z8614 Personal history of Methicillin resistant Staphylococcus aureus infection: Secondary | ICD-10-CM

## 2019-08-21 DIAGNOSIS — W010XXA Fall on same level from slipping, tripping and stumbling without subsequent striking against object, initial encounter: Secondary | ICD-10-CM | POA: Diagnosis present

## 2019-08-21 DIAGNOSIS — G454 Transient global amnesia: Secondary | ICD-10-CM | POA: Diagnosis not present

## 2019-08-21 DIAGNOSIS — Y92017 Garden or yard in single-family (private) house as the place of occurrence of the external cause: Secondary | ICD-10-CM

## 2019-08-21 DIAGNOSIS — Z881 Allergy status to other antibiotic agents status: Secondary | ICD-10-CM | POA: Diagnosis not present

## 2019-08-21 DIAGNOSIS — Z7982 Long term (current) use of aspirin: Secondary | ICD-10-CM

## 2019-08-21 DIAGNOSIS — R55 Syncope and collapse: Secondary | ICD-10-CM | POA: Diagnosis not present

## 2019-08-21 DIAGNOSIS — W19XXXA Unspecified fall, initial encounter: Secondary | ICD-10-CM

## 2019-08-21 DIAGNOSIS — F419 Anxiety disorder, unspecified: Secondary | ICD-10-CM | POA: Diagnosis not present

## 2019-08-21 DIAGNOSIS — I361 Nonrheumatic tricuspid (valve) insufficiency: Secondary | ICD-10-CM | POA: Diagnosis not present

## 2019-08-21 DIAGNOSIS — Z811 Family history of alcohol abuse and dependence: Secondary | ICD-10-CM

## 2019-08-21 DIAGNOSIS — I480 Paroxysmal atrial fibrillation: Secondary | ICD-10-CM | POA: Diagnosis present

## 2019-08-21 DIAGNOSIS — I951 Orthostatic hypotension: Secondary | ICD-10-CM | POA: Diagnosis present

## 2019-08-21 DIAGNOSIS — S0990XA Unspecified injury of head, initial encounter: Secondary | ICD-10-CM

## 2019-08-21 DIAGNOSIS — Z833 Family history of diabetes mellitus: Secondary | ICD-10-CM

## 2019-08-21 DIAGNOSIS — R413 Other amnesia: Secondary | ICD-10-CM | POA: Diagnosis present

## 2019-08-21 DIAGNOSIS — M8580 Other specified disorders of bone density and structure, unspecified site: Secondary | ICD-10-CM | POA: Insufficient documentation

## 2019-08-21 DIAGNOSIS — I472 Ventricular tachycardia: Secondary | ICD-10-CM | POA: Diagnosis not present

## 2019-08-21 DIAGNOSIS — Z8249 Family history of ischemic heart disease and other diseases of the circulatory system: Secondary | ICD-10-CM | POA: Diagnosis not present

## 2019-08-21 DIAGNOSIS — Z882 Allergy status to sulfonamides status: Secondary | ICD-10-CM

## 2019-08-21 DIAGNOSIS — R079 Chest pain, unspecified: Secondary | ICD-10-CM | POA: Diagnosis not present

## 2019-08-21 DIAGNOSIS — G459 Transient cerebral ischemic attack, unspecified: Secondary | ICD-10-CM | POA: Diagnosis not present

## 2019-08-21 DIAGNOSIS — S0993XA Unspecified injury of face, initial encounter: Secondary | ICD-10-CM | POA: Diagnosis not present

## 2019-08-21 DIAGNOSIS — K219 Gastro-esophageal reflux disease without esophagitis: Secondary | ICD-10-CM | POA: Diagnosis present

## 2019-08-21 DIAGNOSIS — R4182 Altered mental status, unspecified: Secondary | ICD-10-CM | POA: Diagnosis not present

## 2019-08-21 LAB — TROPONIN I (HIGH SENSITIVITY)
Troponin I (High Sensitivity): 7 ng/L (ref <18)
Troponin I (High Sensitivity): 10 ng/L (ref <18)

## 2019-08-21 LAB — CBC
HCT: 40.5 % (ref 36.0–46.0)
Hemoglobin: 13.1 g/dL (ref 12.0–15.0)
MCH: 32 pg (ref 26.0–34.0)
MCHC: 32.3 g/dL (ref 30.0–36.0)
MCV: 99 fL (ref 80.0–100.0)
Platelets: 249 10*3/uL (ref 150–400)
RBC: 4.09 MIL/uL (ref 3.87–5.11)
RDW: 12.1 % (ref 11.5–15.5)
WBC: 7.8 10*3/uL (ref 4.0–10.5)
nRBC: 0 % (ref 0.0–0.2)

## 2019-08-21 LAB — URINALYSIS, COMPLETE (UACMP) WITH MICROSCOPIC
Bacteria, UA: NONE SEEN
Bilirubin Urine: NEGATIVE
Glucose, UA: NEGATIVE mg/dL
Hgb urine dipstick: NEGATIVE
Ketones, ur: 5 mg/dL — AB
Leukocytes,Ua: NEGATIVE
Nitrite: NEGATIVE
Protein, ur: NEGATIVE mg/dL
Specific Gravity, Urine: 1.039 — ABNORMAL HIGH (ref 1.005–1.030)
pH: 7 (ref 5.0–8.0)

## 2019-08-21 LAB — RAPID URINE DRUG SCREEN, HOSP PERFORMED
Amphetamines: NOT DETECTED
Barbiturates: NOT DETECTED
Benzodiazepines: NOT DETECTED
Cocaine: NOT DETECTED
Opiates: NOT DETECTED
Tetrahydrocannabinol: NOT DETECTED

## 2019-08-21 LAB — HEPATIC FUNCTION PANEL
ALT: 17 U/L (ref 0–44)
AST: 20 U/L (ref 15–41)
Albumin: 3.7 g/dL (ref 3.5–5.0)
Alkaline Phosphatase: 79 U/L (ref 38–126)
Bilirubin, Direct: <0.1 mg/dL (ref 0.0–0.2)
Total Bilirubin: 0.9 mg/dL (ref 0.3–1.2)
Total Protein: 6.5 g/dL (ref 6.5–8.1)

## 2019-08-21 LAB — BASIC METABOLIC PANEL
Anion gap: 9 (ref 5–15)
BUN: 24 mg/dL — ABNORMAL HIGH (ref 8–23)
CO2: 26 mmol/L (ref 22–32)
Calcium: 9.7 mg/dL (ref 8.9–10.3)
Chloride: 103 mmol/L (ref 98–111)
Creatinine, Ser: 1.03 mg/dL — ABNORMAL HIGH (ref 0.44–1.00)
GFR calc Af Amer: >60 mL/min (ref >60)
GFR calc non Af Amer: 57 mL/min — ABNORMAL LOW (ref >60)
Glucose, Bld: 108 mg/dL — ABNORMAL HIGH (ref 70–99)
Potassium: 4.3 mmol/L (ref 3.5–5.1)
Sodium: 138 mmol/L (ref 135–145)

## 2019-08-21 LAB — LIPID PANEL
Cholesterol: 223 mg/dL — ABNORMAL HIGH (ref 0–200)
HDL: 71 mg/dL (ref >40)
LDL Cholesterol: 141 mg/dL — ABNORMAL HIGH (ref 0–99)
Total CHOL/HDL Ratio: 3.1 RATIO
Triglycerides: 55 mg/dL (ref <150)
VLDL: 11 mg/dL (ref 0–40)

## 2019-08-21 LAB — TSH: TSH: 2.37 u[IU]/mL (ref 0.350–4.500)

## 2019-08-21 LAB — HIV ANTIBODY (ROUTINE TESTING W REFLEX): HIV Screen 4th Generation wRfx: NONREACTIVE

## 2019-08-21 LAB — SARS CORONAVIRUS 2 BY RT PCR (HOSPITAL ORDER, PERFORMED IN ~~LOC~~ HOSPITAL LAB): SARS Coronavirus 2: NEGATIVE

## 2019-08-21 MED ORDER — ACETAMINOPHEN 325 MG PO TABS
650.0000 mg | ORAL_TABLET | Freq: Four times a day (QID) | ORAL | Status: DC | PRN
  Administered 2019-08-21 – 2019-08-23 (×5): 650 mg via ORAL
  Filled 2019-08-21 (×5): qty 2

## 2019-08-21 MED ORDER — IOHEXOL 350 MG/ML SOLN
100.0000 mL | Freq: Once | INTRAVENOUS | Status: AC | PRN
  Administered 2019-08-21: 100 mL via INTRAVENOUS

## 2019-08-21 MED ORDER — METOPROLOL SUCCINATE ER 50 MG PO TB24
50.0000 mg | ORAL_TABLET | Freq: Every day | ORAL | Status: DC
  Administered 2019-08-22 – 2019-08-23 (×2): 50 mg via ORAL
  Filled 2019-08-21 (×2): qty 1

## 2019-08-21 MED ORDER — SODIUM CHLORIDE 0.9% FLUSH: 3.0000 mL | Freq: Once | INTRAVENOUS | Status: DC

## 2019-08-21 MED ORDER — SENNA 8.6 MG PO TABS
1.0000 | ORAL_TABLET | Freq: Two times a day (BID) | ORAL | Status: DC
  Administered 2019-08-21 – 2019-08-23 (×4): 8.6 mg via ORAL
  Filled 2019-08-21 (×4): qty 1

## 2019-08-21 MED ORDER — CALCIUM CARBONATE-VITAMIN D 500-200 MG-UNIT PO TABS
1.0000 | ORAL_TABLET | Freq: Every day | ORAL | Status: DC
  Administered 2019-08-22 – 2019-08-23 (×2): 1 via ORAL
  Filled 2019-08-21 (×2): qty 1

## 2019-08-21 MED ORDER — ONDANSETRON HCL 4 MG/2ML IJ SOLN: 4.0000 mg | Freq: Four times a day (QID) | INTRAMUSCULAR | Status: DC | PRN

## 2019-08-21 MED ORDER — ALPRAZOLAM 0.25 MG PO TABS
0.2500 mg | ORAL_TABLET | Freq: Every evening | ORAL | Status: DC | PRN
  Administered 2019-08-21 – 2019-08-22 (×2): 0.25 mg via ORAL
  Filled 2019-08-21 (×3): qty 1

## 2019-08-21 MED ORDER — ONDANSETRON HCL 4 MG PO TABS: 4.0000 mg | ORAL_TABLET | Freq: Four times a day (QID) | ORAL | Status: DC | PRN

## 2019-08-21 MED ORDER — FLUOXETINE HCL 20 MG PO CAPS
20.0000 mg | ORAL_CAPSULE | Freq: Every day | ORAL | Status: DC
  Administered 2019-08-22 – 2019-08-23 (×2): 20 mg via ORAL
  Filled 2019-08-21 (×2): qty 1

## 2019-08-21 MED ORDER — LORAZEPAM 2 MG/ML IJ SOLN: 1.0000 mg | Freq: Once | INTRAMUSCULAR | Status: DC

## 2019-08-21 NOTE — ED Notes (Signed)
Retrieved pacemaker report from fax and provide it to EDP. As we were looking it over, Brandy [name] called from [employer]. At this time he is speaking to EDP on the phone.

## 2019-08-21 NOTE — ED Notes (Signed)
Pacer interrogated and data sent successfully to [employer].

## 2019-08-21 NOTE — Hospital Course (Addendum)
Admitted 08/21/2019  Allergies: Erythromycin and Sulfa antibiotics Pertinent Hx: Complete heart block s/p pacemaker, GERD  64 y.o. female p/w fall, confusion  * Amnesia: Amnesia, no focal neurological deficits. Transient global amnesia vs seizure vs CVA. Unable to obtain MRI due to pacemaker. Pacemaker interrogated, unremarkable. CT head negative. Neurology following, getting EEG, CTA in AM, echo, orthostatics, A1c, lipid, Utox.   *Hematoma: On left brow/eye area, fall at home. Not on anticoagulant. Hgb stable. CT facial: no fractures. Monitoring.   Consults: Neurology  Meds: Prozac, metoprolol VTE ppx: SCDs IVF: None Diet: NPO pending speech

## 2019-08-21 NOTE — ED Notes (Signed)
Called CT to expedite scan 

## 2019-08-21 NOTE — H&P (Signed)
NAME:  Brandy [name], MRN:  [id number], DOB:  March 12, 1956, LOS: 0 ADMISSION DATE:  08/21/2019, Primary: Patient, No Pcp Per  CHIEF COMPLAINT:  fall  Medical Service: Internal Medicine Teaching Service         Attending Physician: Dr. Lucious Groves, DO    First Contact: Dr. Darrick Meigs Pager: [phone]  Second Contact: Dr. Jerl Mina Pager: [phone]       After Hours (After 5p/  First Contact Pager: [phone]  weekends / holidays): Second Contact Pager: [phone]    History of present illness   64 yo female with PMH of complete heart block s/p pacemaker placement 2010, paroxysmal atrial fibrillation (not on anticoagulation), and anxiety. Presented to [facility] on 08/21/19 for altered mental status following a fall.  Fall was presumedly unwitnessed and pt has no recollection of the events preceding or following the fall. Throughout interview, pt repeats  Due to pt's amnesia, daughter is present at bedside and provides majority of history.   Her daughter notes that she received a phone call from her mother earlier today at which time she sounded confused. Pt does remember waking up this morning and having breakfast but does not recall many events thereafter. At that time, she was feeling her usual self and does not recall any abnormalities over the preceding days. She and her daughter deny recent medication changes/additions, supplements/herbs, foods or recent travel. Pt denies any recent fever, chills, rashes, urinary sx, headache or neck pain preceding the event. Denies history of seizures or other neurologic conditions. No similar events in the past.  No family history of similar symptoms. Denies dissociative symptoms. Denies illicit drug use. No significant alcohol use.   Past Medical History  She,  has a past medical history of Allergic rhinitis, Asthmatic bronchitis, Complete heart block ([place]), GERD (gastroesophageal reflux disease), MRSA (methicillin resistant Staphylococcus aureus) (2013),  Nonsustained ventricular tachycardia ([place]), Presence of permanent cardiac pacemaker (08-05-08), Vaginitis, and Vocal cord paresis.   Home Medications     Prior to Admission medications   Medication Sig Start Date End Date Taking? Authorizing Provider  ALPRAZolam Duanne Moron) 0.25 MG tablet Take 0.25 mg by mouth at bedtime as needed for sleep.    Yes [provider]  aspirin 81 MG tablet Take 81 mg by mouth daily.   Yes [provider]  Calcium Carbonate-Vitamin D (CALCIUM 500 + D) 500-125 MG-UNIT TABS Take 1 tablet by mouth daily.     Yes [provider]  FLUoxetine (PROZAC) 20 MG capsule Take 20 mg by mouth daily.  06/06/16  Yes [provider]  fluticasone (FLONASE) 50 MCG/ACT nasal spray USE 1 SPRAY IN EACH NOSTRIL DAILY Patient taking differently: Place 1 spray into both nostrils daily. USE 1 SPRAY IN EACH NOSTRIL DAILY 10/30/16  Yes Young, Clinton D, MD  metoprolol succinate (TOPROL-XL) 50 MG 24 hr tablet Take 1 tablet (50 mg total) by mouth daily. Take with or immediately following a meal. 07/30/18  Yes Croitoru, Mihai, MD  valACYclovir (VALTREX) 1000 MG tablet Take 1 tablet by mouth daily as needed (cold sores).  06/16/16  Yes [provider]  predniSONE (DELTASONE) 20 MG tablet 1 tab three times a day for 2 days, 1 tab twice daily for 2 days, 1 tab daily for 2 days, 1/2 tab for days Patient not taking: Reported on 08/21/2019 12/02/18   Mina Marble D, NP    Allergies    Allergies as of 08/21/2019 - Review Complete 08/21/2019  Allergen Reaction Noted  .  Erythromycin    . Sulfa antibiotics Rash 02/11/2012    Social History   reports that she has never smoked. She has never used smokeless tobacco. She reports previous alcohol use. She reports that she does not use drugs.   Family History   Her family history includes Alcohol abuse in her paternal grandfather and sister; Cancer in her maternal grandmother; Coronary artery disease in her mother;  Depression in her father, mother, and sister; Diabetes in her father, maternal grandfather, and paternal uncle; Heart disease in her father, maternal grandfather, mother, and paternal grandfather; Hypertension in her father, mother, and sister.   ROS  Review of Systems  Constitutional: Negative for chills and fever.  HENT: Negative for hearing loss.   Eyes: Negative.   Respiratory: Negative.   Cardiovascular: Negative.   Gastrointestinal: Negative.   Genitourinary: Negative.   Musculoskeletal: Negative for neck pain.  Skin: Negative for rash.  Neurological: Positive for loss of consciousness and headaches. Negative for dizziness, tremors, focal weakness and seizures.  Psychiatric/Behavioral: Positive for memory loss. Negative for substance abuse. The patient is nervous/anxious.     Objective   Blood pressure (!) 163/95, pulse 76, temperature 97.6 F (36.4 C), temperature source Oral, resp. rate 16, height 5\' 7"  (1.702 m), weight 64.4 kg, SpO2 100 %.    Filed Weights   08/21/19 1014  Weight: 64.4 kg    Examination: GENERAL: in no acute distress HEENT: hematoma involving left orbital region. Right sided tongue laceration with hemostasis. EOMS intact. No conjunctival hemorrhage appreciated CARDIAC: irregular rhythm, regular rate. No peripheral edema.  PULMONARY: breathing comfortably on room air. Lungs clear ABDOMEN: soft. Nontender to palpation.  Nondistended.  NEURO: alert. Oriented to person and place. Retro and anterograde amnesia present. CN II-XII intact. Upper extremity strength intact 5/5 and equal. Lower extremity strength intact 5/5 and equal. Sensation to extremities and face intact. No pronator drift. No tremor. Intact cerebellar function. SKIN: no rash or lesions on limited exam  PSYCH: tearful/anxious affect  ED course  ED workup including CBC, BMP, and trops unremarkable. Head, C-spine and facial CT without acute findings. Neurology consulted.  Consults:   neurology  Significant Diagnostic Tests:  EKG: appears ventricular paced Pacemaker interrogation: unremarkable  CXR: no acute disease.  CT head/C-spine/maxillofacial: no acute findings  Labs    CBC Latest Ref Rng & Units 08/21/2019 03/28/2018 12/08/2014  WBC 4.0 - 10.5 K/uL 7.8 4.4 -  Hemoglobin 12.0 - 15.0 g/dL 13.1 12.9 13.9  Hematocrit 36.0 - 46.0 % 40.5 38.3 41.0  Platelets 150 - 400 K/uL 249 261 -   BMP Latest Ref Rng & Units 08/21/2019 03/28/2018 08/24/2017  Glucose 70 - 99 mg/dL 108(H) 87 76  BUN 8 - 23 mg/dL 24(H) 19 16  Creatinine 0.44 - 1.00 mg/dL 1.03(H) 1.10(H) 0.90  BUN/Creat Ratio 12 - 28 - 17 18  Sodium 135 - 145 mmol/L 138 144 144  Potassium 3.5 - 5.1 mmol/L 4.3 4.6 4.9  Chloride 98 - 111 mmol/L 103 104 102  CO2 22 - 32 mmol/L 26 25 25   Calcium 8.9 - 10.3 mg/dL 9.7 9.8 9.4     Summary  64 yo female with complete heart block s/p pacemaker (2010), paroxysmal atrial fibrillation (not on anticoagulation), hypertension and anxiety disorder who was admitted to [facility] on 08/21/19 for evaluation for global amnesia and fall.  Assessment & Plan:  Active Problems:   Global amnesia  Global amnesia. Etiology remains unclear. CVA vs TIA vs postictal state vs migraneous  phenomena.  Head CT negative on admission Plan --EEG, CTA head, UDS, TSH, A1C, lipid panel, echo --frequent neuro checks, seizure precautions. NPO for now until cleared by SLP. --orthostatic vitals --repeat head CT tomorrow around noon.  Paroxysmal atrial fibrillation. In afib on admission.  CHADSVASC 4. Not currently anticoagulated. Unclear reason for this. Will need to start anticoagulation however will hold off at this time due to her increased risk for ICH.   Anxiety/depression. Continue home dose 20mg  prozac. Prn xanax once cleared for po.  Best practice:  CODE STATUS: full Diet: npo Pain management: tylenol DVT for prophylaxis: SCDs Social considerations/Family communication: daughter updated  at bedside Dispo: Admit patient to Inpatient with expected length of stay greater than 2 midnights.   Mitzi Hansen, MD INTERNAL MEDICINE RESIDENT PGY-1 Pager # [phone] 08/21/19  5:14 PM

## 2019-08-21 NOTE — Progress Notes (Signed)
EEG complete - results pending 

## 2019-08-21 NOTE — ED Provider Notes (Addendum)
[facility] EMERGENCY DEPARTMENT Provider Note   CSN: [id number] Arrival date & time: 08/21/19  1009     History Chief Complaint  Patient presents with  . Fall  . Chest Pain    Brandy [name] is a 64 y.o. female.  HPI Patient fell this morning outside her home.  Her daughter reports that she got a phone call from her mother at 9:12am.  She lives very nearby and went directly over to assist.  Patient daughter reports that the patient was confused at the time of her arrival.  She had difficulty remembering what had happened and was having repetitive questioning.  She had a large bruise to the side of her head on the left.  Apparently the patient had been out putting up laundry on the line.  She fell behind her house near the garage.  She had however already gotten up and walk to the house and gotten the cloth to put on the side of her head as well as called her daughter.  Daughter reports that there were no focal deficits of weak arms or legs.  She reports typically however her mother has very good recall and no problems with memory.  Repetitive questioning is atypical.  The patient lives alone and independently.  Patient's daughter reports that she last saw her yesterday at 6:30 PM and at that time was completely normal.  Patient takes daily aspirin no other blood thinners.  Patient had mention a couple times of perceiving her heart to be racing or palpitating.  She had made a comment on this from 3 days ago and today.  Patient denies any chest pain.    Past Medical History:  Diagnosis Date  . Allergic rhinitis   . Asthmatic bronchitis   . Complete heart block ([place])   . GERD (gastroesophageal reflux disease)   . MRSA (methicillin resistant Staphylococcus aureus) 2013   left buttock  . Nonsustained ventricular tachycardia ([place])   . 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 Vaginitis   . Vocal cord paresis     Patient Active Problem List    Diagnosis Date Noted  . Contact dermatitis 12/02/2018  . Elevated LDL cholesterol level 04/01/2018  . Decreased GFR 04/01/2018  . Healthcare maintenance 02/21/2018  . Depression, major, recurrent, moderate ([place]) 02/21/2018  . Paroxysmal atrial fibrillation ([place]) 06/23/2016  . Closed fracture of distal clavicle 12/10/2014  . CHB (complete heart block) ([place]) 06/09/2013  . Pacemaker - Medtronic dual-chamber implanted 2010 06/09/2013  . NSVT (nonsustained ventricular tachycardia) ([place]) 06/09/2013  . Acute laryngitis and tracheitis 12/15/2012  . Acute upper respiratory infections of unspecified site 06/10/2012  . Anxiety attack 06/12/2011  . RHINOSINUSITIS, ACUTE 12/28/2009  . CANDIDIASIS OF THE ESOPHAGUS 10/02/2007  . VAGINITIS, RECURRENT 10/02/2007  . VAGINAL DISCHARGE 10/02/2007  . ASTHMATIC BRONCHITIS, ACUTE 06/05/2007  . Seasonal allergic rhinitis 06/05/2007  . Vocal cord paralysis 06/05/2007  . Esophageal reflux 06/05/2007    Past Surgical History:  Procedure Laterality Date  . ACROMIO-CLAVICULAR JOINT REPAIR Left 12/10/2014   Procedure: LEFT CORACOCLAVICULAR RECONSTRUCTION WITH  ZIPLOOP;  Surgeon: Tania Ade, MD;  Location: [facility];  Service: Orthopedics;  Laterality: Left;  . APPENDECTOMY  05-2010  . DILATION AND CURETTAGE OF UTERUS  1978  . HAND SURGERY    . PACEMAKER INSERTION  08/05/08   Medtronic Adapta  . Vocal cord splint  2005     OB History   No obstetric history  on file.     Family History  Problem Relation Age of Onset  . Hypertension Mother   . Coronary artery disease Mother   . Depression Mother   . Heart disease Mother   . Diabetes Father   . Heart disease Father   . Depression Father   . Hypertension Father   . Hypertension Sister   . Alcohol abuse Sister   . Depression Sister   . Diabetes Paternal Uncle   . Cancer Maternal Grandmother         colon  . Heart disease Maternal Grandfather   . Diabetes Maternal Grandfather     . Alcohol abuse Paternal Grandfather   . Heart disease Paternal Grandfather     Social History   Tobacco Use  . Smoking status: Never Smoker  . Smokeless tobacco: Never Used  Substance Use Topics  . Alcohol use: Not Currently  . Drug use: Never    Home Medications Prior to Admission medications   Medication Sig Start Date End Date Taking? Authorizing Provider  ALPRAZolam Duanne Moron) 0.25 MG tablet Take 0.25 mg by mouth at bedtime as needed for sleep.    Yes [provider]  aspirin 81 MG tablet Take 81 mg by mouth daily.   Yes [provider]  Calcium Carbonate-Vitamin D (CALCIUM 500 + D) 500-125 MG-UNIT TABS Take 1 tablet by mouth daily.     Yes [provider]  FLUoxetine (PROZAC) 20 MG capsule Take 20 mg by mouth daily.  06/06/16  Yes [provider]  fluticasone (FLONASE) 50 MCG/ACT nasal spray USE 1 SPRAY IN EACH NOSTRIL DAILY Patient taking differently: Place 1 spray into both nostrils daily. USE 1 SPRAY IN EACH NOSTRIL DAILY 10/30/16  Yes Young, Clinton D, MD  metoprolol succinate (TOPROL-XL) 50 MG 24 hr tablet Take 1 tablet (50 mg total) by mouth daily. Take with or immediately following a meal. 07/30/18  Yes Croitoru, Mihai, MD  valACYclovir (VALTREX) 1000 MG tablet Take 1 tablet by mouth daily as needed (cold sores).  06/16/16  Yes [provider]  predniSONE (DELTASONE) 20 MG tablet 1 tab three times a day for 2 days, 1 tab twice daily for 2 days, 1 tab daily for 2 days, 1/2 tab for days Patient not taking: Reported on 08/21/2019 12/02/18   Mina Marble D, NP    Allergies    Erythromycin and Sulfa antibiotics  Review of Systems   Review of Systems Level 5 caveat cannot obtain review of systems patient's recall is poor at this time. Physical Exam Updated Vital Signs BP (!) 163/95   Pulse 76   Temp 97.6 F (36.4 C) (Oral)   Resp 16   Ht 5\' 7"  (1.702 m)   Wt 64.4 kg   SpO2 100%   BMI 22.24 kg/m   Physical  Exam Constitutional:      Comments: Alert and very anxious.  No respiratory distress.  Well-nourished well-developed.  HENT:     Head:     Comments: Moderate sized hematoma to the left brow and zygoma.  Left brow and zygoma.  Very superficial abrasion.  No diffuse swelling around the eye or the lid.  Extraocular motions are intact.    Nose: Nose normal.     Mouth/Throat:     Mouth: Mucous membranes are moist.     Pharynx: Oropharynx is clear.     Comments: Patient has very small chip to the canine left upper. Eyes:     Extraocular Movements: Extraocular  movements intact.     Conjunctiva/sclera: Conjunctivae normal.     Pupils: Pupils are equal, round, and reactive to light.  Neck:     Comments: No C-spine tenderness to palpation. Cardiovascular:     Rate and Rhythm: Normal rate and regular rhythm.  Pulmonary:     Effort: Pulmonary effort is normal.     Breath sounds: Normal breath sounds.  Abdominal:     General: There is no distension.     Palpations: Abdomen is soft.     Tenderness: There is no abdominal tenderness. There is no guarding.  Musculoskeletal:        General: No swelling, tenderness or deformity. Normal range of motion.     Right lower leg: No edema.     Left lower leg: Edema present.  Skin:    General: Skin is warm and dry.  Neurological:     Comments: Patient is alert.  Speech is clear.  She does have repetitive questioning.  Content is situationally appropriate.  She follows all commands appropriately.  Strength testing upper and lower extremities 5\5.  Psychiatric:     Comments: Anxious but situationally appropriate.     ED Results / Procedures / Treatments   Labs (all labs ordered are listed, but only abnormal results are displayed) Labs Reviewed  BASIC METABOLIC PANEL - Abnormal; Notable for the following components:      Result Value   Glucose, Bld 108 (*)    BUN 24 (*)    Creatinine, Ser 1.03 (*)    GFR calc non Af Amer 57 (*)    All other  components within normal limits  CBC  TROPONIN I (HIGH SENSITIVITY)  TROPONIN I (HIGH SENSITIVITY)    EKG EKG Interpretation  Date/Time:  Thursday Aug 21 2019 10:29:13 EDT Ventricular Rate:  78 PR Interval:  144 QRS Duration: 148 QT Interval:  444 QTC Calculation: 506 R Axis:   141 Text Interpretation: AV dual-paced rhythm Abnormal ECG Confirmed by Charlesetta Shanks [phone]) on 08/21/2019 11:47:12 AM   Radiology DG Chest 2 View  Result Date: 08/21/2019 CLINICAL DATA:  Chest pain. EXAM: CHEST - 2 VIEW COMPARISON:  May 29, 2010. FINDINGS: The heart size and mediastinal contours are within normal limits. Both lungs are clear. Left-sided pacemaker is unchanged in position. No pneumothorax or pleural effusion is noted. The visualized skeletal structures are unremarkable. IMPRESSION: No active cardiopulmonary disease. Electronically Signed   By: Marijo Conception M.D.   On: 08/21/2019 11:15   CT Head Wo Contrast  Result Date: 08/21/2019 CLINICAL DATA:  [place]. Hit head. EXAM: CT HEAD WITHOUT CONTRAST CT MAXILLOFACIAL WITHOUT CONTRAST CT CERVICAL SPINE WITHOUT CONTRAST TECHNIQUE: Multidetector CT imaging of the head, cervical spine, and maxillofacial structures were performed using the standard protocol without intravenous contrast. Multiplanar CT image reconstructions of the cervical spine and maxillofacial structures were also generated. COMPARISON:  None. FINDINGS: CT HEAD FINDINGS Brain: The ventricles are in the midline without mass effect or shift. They are normal in size and configuration for age. The gray-white differentiation is maintained. No extra-axial fluid collections are identified. No mass lesions. No findings for hemispheric infarction or intracranial hemorrhage. Brainstem and cerebellum appear normal. Vascular: Vascular calcifications but no aneurysm or hyperdense vessels. Skull: No skull fracture or bone lesions. Other: No scalp lesions or scalp hematoma. CT MAXILLOFACIAL FINDINGS  Osseous: No acute facial bone fractures are identified. The mandibular condyles are normally located. Significant bilateral TMJ degenerative changes. No mandible fracture is identified. Orbits: No  orbital fractures. The globes are intact. Sinuses: The paranasal sinuses and mastoid air cells are clear. Soft tissues: No obvious laceration, contusion or hematoma. CT CERVICAL SPINE FINDINGS Alignment: Degenerative posterior subluxation of C5 compared to C4 and C6. This is due to significant facet disease. Skull base and vertebrae: No acute fracture. No primary bone lesion or focal pathologic process. Soft tissues and spinal canal: No prevertebral fluid or swelling. No visible canal hematoma. Disc levels: The spinal canal is quite generous. No significant spinal stenosis. No large disc protrusions are identified. Upper chest: The lung apices are grossly clear. Biapical pleural and parenchymal scarring changes are noted. Other: Age advanced vertebral artery calcifications. Surgical changes with some type of radiopaque material in the region of the true cords bilaterally. IMPRESSION: 1. No acute intracranial findings or skull fracture. 2. No acute facial bone fractures are identified. 3. 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 4. No acute cervical spine fracture. 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 Electronically Signed   By: Marijo Sanes M.D.   On: 08/21/2019 12:23   CT Cervical Spine Wo Contrast  Result Date: 08/21/2019 CLINICAL DATA:  [place]. Hit head. EXAM: CT HEAD WITHOUT CONTRAST CT MAXILLOFACIAL WITHOUT CONTRAST CT CERVICAL SPINE WITHOUT CONTRAST TECHNIQUE: Multidetector CT imaging of the head, cervical spine, and maxillofacial structures were performed using the standard protocol without intravenous contrast. Multiplanar CT image reconstructions of the cervical spine and maxillofacial structures were also generated. COMPARISON:   None. FINDINGS: CT HEAD FINDINGS Brain: The ventricles are in the midline without mass effect or shift. They are normal in size and configuration for age. The gray-white differentiation is maintained. No extra-axial fluid collections are identified. No mass lesions. No findings for hemispheric infarction or intracranial hemorrhage. Brainstem and cerebellum appear normal. Vascular: Vascular calcifications but no aneurysm or hyperdense vessels. Skull: No skull fracture or bone lesions. Other: No scalp lesions or scalp hematoma. CT MAXILLOFACIAL FINDINGS Osseous: No acute facial bone fractures are identified. The mandibular condyles are normally located. Significant bilateral TMJ degenerative changes. No mandible fracture is identified. Orbits: No orbital fractures. The globes are intact. Sinuses: The paranasal sinuses and mastoid air cells are clear. Soft tissues: No obvious laceration, contusion or hematoma. CT CERVICAL SPINE FINDINGS Alignment: Degenerative posterior subluxation of C5 compared to C4 and C6. This is due to significant facet disease. Skull base and vertebrae: No acute fracture. No primary bone lesion or focal pathologic process. Soft tissues and spinal canal: No prevertebral fluid or swelling. No visible canal hematoma. Disc levels: The spinal canal is quite generous. No significant spinal stenosis. No large disc protrusions are identified. Upper chest: The lung apices are grossly clear. Biapical pleural and parenchymal scarring changes are noted. Other: Age advanced vertebral artery calcifications. Surgical changes with some type of radiopaque material in the region of the true cords bilaterally. IMPRESSION: 1. No acute intracranial findings or skull fracture. 2. No acute facial bone fractures are identified. 3. 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 4. No acute cervical spine fracture. Age advanced degenerative cervical spondylosis with multilevel disc disease and facet  disease. Electronically Signed   By: Marijo Sanes M.D.   On: 08/21/2019 12:23   CT Maxillofacial WO CM  Result Date: 08/21/2019 CLINICAL DATA:  [place]. Hit head. EXAM: CT HEAD WITHOUT CONTRAST CT MAXILLOFACIAL WITHOUT CONTRAST CT CERVICAL SPINE WITHOUT CONTRAST TECHNIQUE: Multidetector CT imaging of the head, cervical spine, and maxillofacial structures were performed using the standard protocol without  intravenous contrast. Multiplanar CT image reconstructions of the cervical spine and maxillofacial structures were also generated. COMPARISON:  None. FINDINGS: CT HEAD FINDINGS Brain: The ventricles are in the midline without mass effect or shift. They are normal in size and configuration for age. The gray-white differentiation is maintained. No extra-axial fluid collections are identified. No mass lesions. No findings for hemispheric infarction or intracranial hemorrhage. Brainstem and cerebellum appear normal. Vascular: Vascular calcifications but no aneurysm or hyperdense vessels. Skull: No skull fracture or bone lesions. Other: No scalp lesions or scalp hematoma. CT MAXILLOFACIAL FINDINGS Osseous: No acute facial bone fractures are identified. The mandibular condyles are normally located. Significant bilateral TMJ degenerative changes. No mandible fracture is identified. Orbits: No orbital fractures. The globes are intact. Sinuses: The paranasal sinuses and mastoid air cells are clear. Soft tissues: No obvious laceration, contusion or hematoma. CT CERVICAL SPINE FINDINGS Alignment: Degenerative posterior subluxation of C5 compared to C4 and C6. This is due to significant facet disease. Skull base and vertebrae: No acute fracture. No primary bone lesion or focal pathologic process. Soft tissues and spinal canal: No prevertebral fluid or swelling. No visible canal hematoma. Disc levels: The spinal canal is quite generous. No significant spinal stenosis. No large disc protrusions are identified. Upper chest: The  lung apices are grossly clear. Biapical pleural and parenchymal scarring changes are noted. Other: Age advanced vertebral artery calcifications. Surgical changes with some type of radiopaque material in the region of the true cords bilaterally. IMPRESSION: 1. No acute intracranial findings or skull fracture. 2. No acute facial bone fractures are identified. 3. 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 4. No acute cervical spine fracture. 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 Electronically Signed   By: Marijo Sanes M.D.   On: 08/21/2019 12:23    Procedures Procedures (including critical care time) CRITICAL CARE Performed by: Charlesetta Shanks   Total critical care time: 30 minutes  Critical care time was exclusive of separately billable procedures and treating other patients.  Critical care was necessary to treat or prevent imminent or life-threatening deterioration.  Critical care was time spent personally by me on the following activities: development of treatment plan with patient and/or surrogate as well as nursing, discussions with consultants, evaluation of patient's response to treatment, examination of patient, obtaining history from patient or surrogate, ordering and performing treatments and interventions, ordering and review of laboratory studies, ordering and review of radiographic studies, pulse oximetry and re-evaluation of patient's condition. Medications Ordered in ED Medications  sodium chloride flush (NS) 0.9 % injection 3 mL (has no administration in time range)  LORazepam (ATIVAN) injection 1 mg (0 mg Intravenous Hold 08/21/19 1340)    ED Course  I have reviewed the triage vital signs and the nursing notes.  Pertinent labs & imaging results that were available during my care of the patient were reviewed by me and considered in my medical decision making (see chart for details).  Clinical Course as  of Sep 11 2  Thu Aug 21, 2019  1446 Consult: I have reviewed with Dr. Rory Percy.  Since patient cannot have MRI due to pacemaker, recommends CT angiogram.  Also requests pacemaker interrogation.   [MP]  [id number] Pacemaker interrogation for no acute events.  This was reported by Seymour Bars from [employer].  Last episode of documented high ventricular rate was on 4\11\2021.  The settings document for a ventricular rate greater than 150 and a atrial rate greater than 175.   [  MP]  1550 Consult: Internal medicine teaching service for admission.   [MP]    Clinical Course User Index [MP] Charlesetta Shanks, MD   MDM Rules/Calculators/A&P                     Patient had an unwitnessed fall this morning.  The exact time is unclear.  She did call her daughter around 9:15 AM.  The patient fell behind her garage while putting up laundry.  Patient has had repetitive questioning and has amnesia to the event.  The patient is alert and her speech is clear.  She does not have any motor deficits.  It is unclear at this time if the repetitive questioning is due to concussive symptoms versus TGA versus CVA.  Patient CT head does not show any acute intracranial injury.  He does have a contusion to the left brow.  She clearly fell.  Unclear if this is a syncope or a mechanical fall.  Pacemaker was interrogated and does not show any acute events.  Last high ventricular rate recording was March 11.  Troponin is normal.  Patient is not a candidate for MRI due to her pacemaker.  She is otherwise clinically well in appearance.  Normal baseline mental status is clear and she lives independently.  At this time plan will be for admission for observation with persistent repetitive questioning for rule out stroke versus concussion. Final Clinical Impression(s) / ED Diagnoses Final diagnoses:  Injury of head, initial encounter  Fall, initial encounter  Short-term memory loss    Rx / DC Orders ED Discharge Orders    None        Charlesetta Shanks, MD 08/21/19 1454    Charlesetta Shanks, MD 09/11/19 0004

## 2019-08-21 NOTE — Procedures (Signed)
ELECTROENCEPHALOGRAM REPORT   Patient: Brandy [name]       Room #: [id number] EEG No. ID: [id number] Age: 64 y.o.        Sex: female Requesting Physician: [place] Report Date:  08/21/2019        Interpreting Physician: Alexis Goodell  History: Brandy [name] is an 64 y.o. female with altered mental status  Medications:  Prozac, Toprol  Conditions of Recording:  This is a 21 channel routine scalp EEG performed with bipolar and monopolar montages arranged in accordance to the international 10/20 system of electrode placement. One channel was dedicated to EKG recording.  The patient is in the awake and drowsy states.  Description:  The waking background activity consists of a low voltage, symmetrical, fairly well organized, 8-9 Hz alpha activity, seen from the parieto-occipital and posterior temporal regions.  Low voltage fast activity, poorly organized, is seen anteriorly and is at times superimposed on more posterior regions.  A mixture of theta and alpha rhythms are seen from the central and temporal regions. The patient drowses with slowing to irregular, low voltage theta and beta activity.   Stage II sleep is not obtained. No epileptiform activity is noted.   Hyperventilation and intermittent photic stimulation were not performed.  IMPRESSION: Normal electroencephalogram, awake and drowsy. There are no focal lateralizing or epileptiform features.   Alexis Goodell, MD Neurology [phone] 08/21/2019, 9:32 PM

## 2019-08-21 NOTE — Progress Notes (Signed)
Patient has an unsafe pacemaker for MRI. Notified ordering provider.

## 2019-08-21 NOTE — ED Notes (Signed)
Pt does not recall events of today. Repeating questions and statements.

## 2019-08-21 NOTE — Progress Notes (Signed)
Pt just arrived from ED s/p fall, pt alert but a bit forgetful, oriented to self and place, c/o of slight headache with a rating of 4, pt settled in bed with daughter at bedside, tele monitor put and verified on pt, safety measures explained and initiated accordingly, was however reassured and will continue to monitor, call light within pt's reach. [place]-As[place], Derren Suydam Efe

## 2019-08-21 NOTE — ED Triage Notes (Signed)
Pt and family member reports pt having a fall this am. Pt does not remember the event or if she had loc, thinks she tripped and fell. Has hematoma to left eye and forehead. Reports having cardiac hx had episodes of chest pressure Monday and today.

## 2019-08-21 NOTE — ED Notes (Signed)
Notified EDP that MRI says her pacer is not compatible with MRI.

## 2019-08-21 NOTE — Consult Note (Addendum)
Neurology Consultation  Reason for Consult: Altered mental status Referring Physician: Dr. Ralph Dowdy  CC: Altered mental status, amnestic episode, possible syncopal episode  History is obtained from: Chart, patient, patient's daughter who is a neonatal ICU nurse here at [facility].  HPI: Brandy [name] is a 64 y.o. female with a past medical history of complete heart block status post permanent cardiac pacemaker with completely pacemaker dependent rhythm, paroxysmal atrial fibrillation-incidentally discovered according to cardiology notes and not on anticoagulation, nonsustained ventricular tachycardia, presented to the emergency room after she called her daughter to come bring her to the hospital.  The patient remembers that she went to bed normal last night and woke up this morning.  Details after waking up are hazy-she is not sure if she made herself coffee and had a coffee and also does not remember if she took her dog out for a walk like every other morning.  The next thing she remembers is that she was in the hospital.  The daughter tells me that she had called her, gotten herself in, got herself a towel to clean her face where she was bleeding from as she was presumably either down on the ground or hit her head somewhere.  She was brought to the hospital where she continued to have repetitive questioning of the events as well as certain other things such as her chipped tooth. She denied any focal tingling numbness or weakness.  Denies any strokes in the past.  Does not have any recollection of the events preceding her being brought to the hospital.  Is very frustrated at this time with the fact that she cannot remember those things at all.  She has not had any such episodes in the past. In terms of recent stressors, she has lost her husband a few months ago to lung cancer.  Her daughter lives across from her her name is [place] and she is a Press photographer. Looking at her cardiology notes  briefly, she has had some episodes of dizziness but never lost consciousness or had syncopal episodes. Pacemaker was interrogated today in the ED without any pertinent or insight above etiology for her presentation or anything suspicious for an acute arrhythmia.  Daughter reports that she was extremely anxious on Monday and took a Xanax.  She felt a little bit worse on Tuesday but then was much better later in the day.  She takes a Xanax off and on more so since her husband's death but does not take it daily.  LKW: Sometime last night when she went to bed-patient cannot remember tpa given?: no,  Premorbid modified Rankin scale (mRS): 0 ROS: Performed and negative except noted in HPI.  Past Medical History:  Diagnosis Date  . Allergic rhinitis   . Asthmatic bronchitis   . Complete heart block ([place])   . GERD (gastroesophageal reflux disease)   . MRSA (methicillin resistant Staphylococcus aureus) 2013   left buttock  . Nonsustained ventricular tachycardia ([place])   . 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 Vaginitis   . Vocal cord paresis     Family History  Problem Relation Age of Onset  . Hypertension Mother   . Coronary artery disease Mother   . Depression Mother   . Heart disease Mother   . Diabetes Father   . Heart disease Father   . Depression Father   . Hypertension Father   . Hypertension Sister   . Alcohol abuse Sister   .  Depression Sister   . Diabetes Paternal Uncle   . Cancer Maternal Grandmother         colon  . Heart disease Maternal Grandfather   . Diabetes Maternal Grandfather   . Alcohol abuse Paternal Grandfather   . Heart disease Paternal Grandfather    Social History:   reports that she has never smoked. She has never used smokeless tobacco. She reports previous alcohol use. She reports that she does not use drugs.  Medications  Current Facility-Administered Medications:  .  LORazepam (ATIVAN) injection 1 mg, 1 mg,  Intravenous, Once, Charlesetta Shanks, MD, Stopped at 08/21/19 1340 .  sodium chloride flush (NS) 0.9 % injection 3 mL, 3 mL, Intravenous, Once, Charlesetta Shanks, MD  Current Outpatient Medications:  .  ALPRAZolam (XANAX) 0.25 MG tablet, Take 0.25 mg by mouth at bedtime as needed for sleep. , Disp: , Rfl:  .  aspirin 81 MG tablet, Take 81 mg by mouth daily., Disp: , Rfl:  .  Calcium Carbonate-Vitamin D (CALCIUM 500 + D) 500-125 MG-UNIT TABS, Take 1 tablet by mouth daily.  , Disp: , Rfl:  .  FLUoxetine (PROZAC) 20 MG capsule, Take 20 mg by mouth daily. , Disp: , Rfl:  .  fluticasone (FLONASE) 50 MCG/ACT nasal spray, USE 1 SPRAY IN EACH NOSTRIL DAILY (Patient taking differently: Place 1 spray into both nostrils daily. USE 1 SPRAY IN EACH NOSTRIL DAILY), Disp: 16 g, Rfl: 5 .  metoprolol succinate (TOPROL-XL) 50 MG 24 hr tablet, Take 1 tablet (50 mg total) by mouth daily. Take with or immediately following a meal., Disp: 90 tablet, Rfl: 3 .  valACYclovir (VALTREX) 1000 MG tablet, Take 1 tablet by mouth daily as needed (cold sores). , Disp: , Rfl:  .  predniSONE (DELTASONE) 20 MG tablet, 1 tab three times a day for 2 days, 1 tab twice daily for 2 days, 1 tab daily for 2 days, 1/2 tab for days (Patient not taking: Reported on 08/21/2019), Disp: 13 tablet, Rfl: 0  Exam: Current vital signs: BP (!) 163/95   Pulse 76   Temp 97.6 F (36.4 C) (Oral)   Resp 16   Ht 5\' 7"  (1.702 m)   Wt 64.4 kg   SpO2 100%   BMI 22.24 kg/m  Vital signs in last 24 hours: Temp:  [97.6 F (36.4 C)] 97.6 F (36.4 C) (05/20 1014) Pulse Rate:  [74-78] 76 (05/20 1416) Resp:  [14-18] 16 (05/20 1416) BP: (158-184)/(88-95) 163/95 (05/20 1415) SpO2:  [98 %-100 %] 100 % (05/20 1416) Weight:  [64.4 kg] 64.4 kg (05/20 1014) General: She is awake alert in no distress HEENT: She has a moderate sized bruise on the right lateral aspect of tongue, she also has a bruise under the left eye and over the left eyelid along with the left  forehead.  She is also chipped tooth. CVS: Regular rate rhythm Respiratory: Breathing well saturating normally on room air Extremities warm well perfused Neurological exam She is awake, alert, oriented to self.  On asking where she was, she said in the hospital.  Could not name the hospital at first but told me that she was in [place].  On asking which hospital might she be in [place] and how many hospital does [place] have that she said there are 2 [place] and [place] long and she is probably an [facility] which is the right answer.  She could not name the current president correctly on the first attempt-asked  if it is still President Trump and on saying no, she did say present by them.  She could not tell me the president before President Trump.  She could correctly name the president from [place] who was shot in [place]. Mildly reduced attention concentration No dysarthria No aphasia Cranial nerves: Pupils equal round reactive to light, extraocular movements intact, visual fields full, face appears symmetric, tongue and palate midline with a prominent right lateral margin bruise and cut on the tongue. Motor exam: Antigravity in all 4 extremities with full strength 5/5 in all fours. Normal tone.  Normal range of motion. Sensory exam: Intact light touch without any extinction on double simultaneous stimulation. No evidence of dysmetria on finger-nose-finger testing. Gait testing was deferred at this time NIH stroke scale-1  Labs I have reviewed labs in epic and the results pertinent to this consultation are:  CBC    Component Value Date/Time   WBC 7.8 08/21/2019 1047   RBC 4.09 08/21/2019 1047   HGB 13.1 08/21/2019 1047   HGB 12.9 03/28/2018 0858   HCT 40.5 08/21/2019 1047   HCT 38.3 03/28/2018 0858   PLT 249 08/21/2019 1047   PLT 261 03/28/2018 0858   MCV 99.0 08/21/2019 1047   MCV 97 03/28/2018 0858   MCH 32.0 08/21/2019 1047   MCHC 32.3  08/21/2019 1047   RDW 12.1 08/21/2019 1047   RDW 12.1 (L) 03/28/2018 0858   LYMPHSABS 1.6 03/28/2018 0858   MONOABS 0.7 05/30/2010 0420   EOSABS 0.2 03/28/2018 0858   BASOSABS 0.0 03/28/2018 0858    CMP     Component Value Date/Time   NA 138 08/21/2019 1047   NA 144 03/28/2018 0858   K 4.3 08/21/2019 1047   CL 103 08/21/2019 1047   CO2 26 08/21/2019 1047   GLUCOSE 108 (H) 08/21/2019 1047   BUN 24 (H) 08/21/2019 1047   BUN 19 03/28/2018 0858   CREATININE 1.03 (H) 08/21/2019 1047   CREATININE 0.99 07/14/2015 1414   CALCIUM 9.7 08/21/2019 1047   PROT 6.3 03/28/2018 0858   ALBUMIN 4.2 03/28/2018 0858   AST 17 03/28/2018 0858   ALT 16 03/28/2018 0858   ALKPHOS 84 03/28/2018 0858   BILITOT 0.3 03/28/2018 0858   GFRNONAA 57 (L) 08/21/2019 1047   GFRAA >60 08/21/2019 1047   Imaging I have reviewed the images obtained:  CT-scan of the brain-no evidence of acute bleed.  Official reading mentions no acute abnormality but there might be a very small area of hypodensity adjacent to the left caudate head and the external capsule area.  Might be artifactual but worth repeating a 24-hour head CT to look for any evidence of evolution.  Cannot get an MRI due to incompatible pacemaker.  Assessment:  64 year old woman with above past medical history presenting for evaluation of altered mental status-she called her daughter to bring her to the hospital when she found herself either on the ground or strike her head somewhere which she does not remember.  Upon arrival to the hospital, she was noted to have some left head superficial trauma, also noted to have tongue bite on the right tongue and has repetitive questioning with some reduced attention concentration. Differentials is broad at this time and includes: -Transient global amnesia (TGA) -Stroke versus TIA -Posttraumatic seizure due to head trauma-given amnesia as well as tongue bite -Syncopal episode  Recommendations: -Maintain seizure  precautions -EEG -No need for antiepileptics at this time-if it is a first-time seizure without any imaging or electrographic  abnormalities, she will not require an antiepileptic but if it turns out that her EEG shows any abnormalities, we might consider an antiepileptic. -Obtain a CT angiogram of the head and neck to evaluate for any vertebrobasilar insufficiency or other vascular obstruction or stenosis. -Obtain a 2D echocardiogram -Frequent neurochecks -Telemetry -Check orthostatics -Check urinary toxicology screen -Check A1c and lipid panel -Repeat head CT 24 hours from the last CT head that was obtained at noon today.  Order CT head without contrast for 08/22/2019 at noon. -After work-up is completed, I might recommend touching base with cardiology regarding anticoagulation since she has paroxysmal atrial fibrillation and this episode could very well have been a TIA making her CHA2DS2-VASc score to be 4. Plan relayed to primary team Resident MDs.  -- Amie Portland, MD [facility] Pager: [phone] If 7pm to 7am, please call on call as listed on AMION.

## 2019-08-21 NOTE — ED Notes (Signed)
Neuro at bedside.

## 2019-08-21 NOTE — ED Notes (Signed)
Pt transported to CT ?

## 2019-08-22 ENCOUNTER — Inpatient Hospital Stay (HOSPITAL_COMMUNITY): Payer: PPO

## 2019-08-22 DIAGNOSIS — I361 Nonrheumatic tricuspid (valve) insufficiency: Secondary | ICD-10-CM

## 2019-08-22 DIAGNOSIS — R413 Other amnesia: Secondary | ICD-10-CM

## 2019-08-22 DIAGNOSIS — S0990XA Unspecified injury of head, initial encounter: Secondary | ICD-10-CM

## 2019-08-22 DIAGNOSIS — I951 Orthostatic hypotension: Secondary | ICD-10-CM | POA: Clinically undetermined

## 2019-08-22 LAB — CBC
HCT: 39.1 % (ref 36.0–46.0)
Hemoglobin: 12.9 g/dL (ref 12.0–15.0)
MCH: 32.6 pg (ref 26.0–34.0)
MCHC: 33 g/dL (ref 30.0–36.0)
MCV: 98.7 fL (ref 80.0–100.0)
Platelets: 209 10*3/uL (ref 150–400)
RBC: 3.96 MIL/uL (ref 3.87–5.11)
RDW: 12.2 % (ref 11.5–15.5)
WBC: 6 10*3/uL (ref 4.0–10.5)
nRBC: 0 % (ref 0.0–0.2)

## 2019-08-22 LAB — COMPREHENSIVE METABOLIC PANEL
ALT: 14 U/L (ref 0–44)
AST: 17 U/L (ref 15–41)
Albumin: 3.3 g/dL — ABNORMAL LOW (ref 3.5–5.0)
Alkaline Phosphatase: 66 U/L (ref 38–126)
Anion gap: 8 (ref 5–15)
BUN: 19 mg/dL (ref 8–23)
CO2: 25 mmol/L (ref 22–32)
Calcium: 8.8 mg/dL — ABNORMAL LOW (ref 8.9–10.3)
Chloride: 107 mmol/L (ref 98–111)
Creatinine, Ser: 0.92 mg/dL (ref 0.44–1.00)
GFR calc Af Amer: >60 mL/min (ref >60)
GFR calc non Af Amer: >60 mL/min (ref >60)
Glucose, Bld: 97 mg/dL (ref 70–99)
Potassium: 3.7 mmol/L (ref 3.5–5.1)
Sodium: 140 mmol/L (ref 135–145)
Total Bilirubin: 0.6 mg/dL (ref 0.3–1.2)
Total Protein: 5.8 g/dL — ABNORMAL LOW (ref 6.5–8.1)

## 2019-08-22 LAB — URINE CULTURE

## 2019-08-22 MED ORDER — IOHEXOL 350 MG/ML SOLN
75.0000 mL | Freq: Once | INTRAVENOUS | Status: AC | PRN
  Administered 2019-08-22: 75 mL via INTRAVENOUS

## 2019-08-22 MED ORDER — RIVAROXABAN 20 MG PO TABS
20.0000 mg | ORAL_TABLET | Freq: Every day | ORAL | Status: DC
  Administered 2019-08-22 – 2019-08-23 (×2): 20 mg via ORAL
  Filled 2019-08-22 (×2): qty 1

## 2019-08-22 NOTE — Progress Notes (Signed)
NAME:  Brandy [name], MRN:  [id number], DOB:  03-Aug-1955, LOS: 1 ADMISSION DATE:  08/21/2019   Brief History  64 yo female with PMH of PAF (not on anticoagulation) and complete heart block s/p pacemaker placement (2010). She presented to [facility] on 08/21/19 for altered mental status following an unwitnessed fall. Since that time, she had shown s/s of transglobal amnesia. Workup in the ED was unremarkable.  Subjective  Symptoms essentially resolved this morning. Recognized me when I walked in. Discussed plan for further evaluation with repeat scans today. We also discussed anticoagulation for atrial fibrillation. She notes that she has only been on aspirin at home however one of her providers has previously discussed starting anticoagulation. Discussed increased risk of stroke with afib and role for anticoagulation. Discussed various options and differences between them. Risks including increase chance of bleeding reviewed and she would like to start anticoagulation at this time.  Significant Hospital Events   5/20 hospital admission 5/21 symptom resolution. Starting xarelto  Objective   Blood pressure 107/62, pulse 87, temperature (!) 97.5 F (36.4 C), temperature source Oral, resp. rate 16, height 5\' 7"  (1.702 m), weight 64.4 kg, SpO2 97 %.    No intake or output data in the 24 hours ending 08/22/19 0512 Filed Weights   08/21/19 1014  Weight: 64.4 kg    Examination: GENERAL: in no acute distress HEENT: periorbital hematoma  CARDIAC: regular rhythm and rate NEURO: alert and oriented x4. CN II-XII intact. 5/5 strength in all extremities.   Consults:  neurology  Significant Diagnostic Tests:  5/20 CT head, C-spine, maxillofacial> no acute findings 5/20 CTA head>no acute findings 5/20 EEG>unremarkable  5/21 CT head> 5/21 CTA head/neck>  5/21 echocardiogram>  Micro Data:  none  Antimicrobials:  none   CBC Latest Ref Rng & Units 08/22/2019 08/21/2019 03/28/2018  WBC  4.0 - 10.5 K/uL 6.0 7.8 4.4  Hemoglobin 12.0 - 15.0 g/dL 12.9 13.1 12.9  Hematocrit 36.0 - 46.0 % 39.1 40.5 38.3  Platelets 150 - 400 K/uL 209 249 261   BMP Latest Ref Rng & Units 08/21/2019 03/28/2018 08/24/2017  Glucose 70 - 99 mg/dL 108(H) 87 76  BUN 8 - 23 mg/dL 24(H) 19 16  Creatinine 0.44 - 1.00 mg/dL 1.03(H) 1.10(H) 0.90  BUN/Creat Ratio 12 - 28 - 17 18  Sodium 135 - 145 mmol/L 138 144 144  Potassium 3.5 - 5.1 mmol/L 4.3 4.6 4.9  Chloride 98 - 111 mmol/L 103 104 102  CO2 22 - 32 mmol/L 26 25 25   Calcium 8.9 - 10.3 mg/dL 9.7 9.8 9.4    Summary  64 yo female with PAF (not on anticoagulation) and complete heart block s/p pacemaker (2010) who was admitted to [facility] on 08/21/19 for workup of transglobal amnesia. Workup including CT head/neck/MF, CTA head, and EEG have all been unremarkable.  Assessment & Plan:  Active Problems:   Global amnesia  Transient global amnesia--resolved. Memory appears completely intact this morning. Remembers she was out hanging clothes when she fell. ddx still includes TIA vs trauma Plan per neurology: echo, repeat CT head and CTA head/neck today.   Paroxysmal atrial fibrillation. CHADSVASC 3. Pt agrees with starting anticoagulation. Discussed start time given her recent fall --Dr. Rory Percy ok with starting anticoagulation now. No evidence of bleed on head CT yesterday Plan: will start on xarelto 20mg .  Best practice:  CODE STATUS: full Diet: may resume regular diet following bedside swallow eval DVT for prophylaxis: xarelto Social considerations/Family communication: will update  daughter later today Dispo: possible discharge later today pending completion of repeat imaging and echo   Mitzi Hansen, MD [facility] PGY-1 PAGER #: [phone] 08/22/19  5:12 AM   Labs    CBC Latest Ref Rng & Units 08/22/2019 08/21/2019 03/28/2018  WBC 4.0 - 10.5 K/uL 6.0 7.8 4.4  Hemoglobin 12.0 - 15.0 g/dL 12.9 13.1 12.9  Hematocrit 36.0 - 46.0 %  39.1 40.5 38.3  Platelets 150 - 400 K/uL 209 249 261   BMP Latest Ref Rng & Units 08/22/2019 08/21/2019 03/28/2018  Glucose 70 - 99 mg/dL 97 108(H) 87  BUN 8 - 23 mg/dL 19 24(H) 19  Creatinine 0.44 - 1.00 mg/dL 0.92 1.03(H) 1.10(H)  BUN/Creat Ratio 12 - 28 - - 17  Sodium 135 - 145 mmol/L 140 138 144  Potassium 3.5 - 5.1 mmol/L 3.7 4.3 4.6  Chloride 98 - 111 mmol/L 107 103 104  CO2 22 - 32 mmol/L 25 26 25   Calcium 8.9 - 10.3 mg/dL 8.8(L) 9.7 9.8

## 2019-08-22 NOTE — Evaluation (Addendum)
Clinical/Bedside Swallow Evaluation Patient Details  Name: Brandy [name] MRN: [id number] Date of Birth: 01-14-1956  Today's Date: 08/22/2019 Time: SLP Start Time (ACUTE ONLY): 1104 SLP Stop Time (ACUTE ONLY): 1118 SLP Time Calculation (min) (ACUTE ONLY): 14 min  Past Medical History:  Past Medical History:  Diagnosis Date  . Allergic rhinitis   . Asthmatic bronchitis   . Complete heart block ([place])   . GERD (gastroesophageal reflux disease)   . MRSA (methicillin resistant Staphylococcus aureus) 2013   left buttock  . Nonsustained ventricular tachycardia ([place])   . 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 Vaginitis   . Vocal cord paresis    Past Surgical History:  Past Surgical History:  Procedure Laterality Date  . ACROMIO-CLAVICULAR JOINT REPAIR Left 12/10/2014   Procedure: LEFT CORACOCLAVICULAR RECONSTRUCTION WITH  ZIPLOOP;  Surgeon: Tania Ade, MD;  Location: [facility];  Service: Orthopedics;  Laterality: Left;  . APPENDECTOMY  05-2010  . DILATION AND CURETTAGE OF UTERUS  1978  . HAND SURGERY    . PACEMAKER INSERTION  08/05/08   Medtronic Adapta  . Vocal cord splint  [id number]   HPI:  64 year old woman with PMHx of PAF, complete heart block s/p pacemaker (2010) admitted for evaluation of altered mental status after unwitnessed fall. Amnestic for time preceding and post-event;  recalls events during drive to hospital and subsequently. CT head with no acute findings; repeat CT pending today.    Assessment / Plan / Recommendation Clinical Impression  Pt presents with normal oropharyngeal swallow function with adequate mastication (despite some tooth pain due to fall), brisk swallow response, no oral residue post-swallow, and no s/s of aspiration. No dysphagia identified.  Continue regular diet, thin liquids. No SLP f/u needed. D/W pt and her daughter.  SLP Visit Diagnosis: Dysphagia, unspecified (R13.10)    Aspiration Risk  No  limitations    Diet Recommendation   regular solids, thin liquids  Medication Administration: Whole meds with liquid    Other  Recommendations Oral Care Recommendations: Oral care BID   Follow up Recommendations None      Frequency and Duration     n/a       Prognosis   n/a     Swallow Study   General Date of Onset: 08/22/19 HPI: 64 year old woman with PMHx of PAF, complete heart block s/p pacemaker (2010) admitted for evaluation of altered mental status after unwitnessed fall. Amnestic for time preceding and post-event;  recalls events during drive to hospital and subsequently. CT head with no acute findings; repeat CT pending today.  Type of Study: Bedside Swallow Evaluation Previous Swallow Assessment: no Diet Prior to this Study: Regular;Thin liquids Temperature Spikes Noted: No Respiratory Status: Room air History of Recent Intubation: No Behavior/Cognition: Alert Oral Cavity Assessment: Within Functional Limits Oral Care Completed by SLP: No Oral Cavity - Dentition: Other (Comment)(broken teeth due to fall) Vision: Functional for self-feeding Self-Feeding Abilities: Able to feed self Patient Positioning: Upright in bed Baseline Vocal Quality: Normal Volitional Cough: Strong Volitional Swallow: Able to elicit    Oral/Motor/Sensory Function Overall Oral Motor/Sensory Function: Within functional limits   Ice Chips Ice chips: Not tested   Thin Liquid Thin Liquid: Within functional limits Presentation: Cup;Self Fed    Nectar Thick Nectar Thick Liquid: Not tested   Honey Thick Honey Thick Liquid: Not tested   Puree Puree: Within functional limits   Solid     Solid: Within functional limits  Juan Quam Laurice 08/22/2019,11:32 AM Estill Bamberg L. Tivis Ringer, [place] Office number [phone] Pager [phone]

## 2019-08-22 NOTE — Progress Notes (Signed)
Internal Medicine Attending:   I saw and examined the patient. I reviewed Dr Chase Picket note and I agree with the resident's findings and plan as documented in the resident's note.  Overall she is symptomatically improved and appears near baseline.  EEG did not show evidence of current seizure.  She did present in A. fib and does not anticoagulated raising the concern for TIA or stroke.  MRI was not able to be obtained due to incompatible pacemaker.  Plan is for repeat CT of the head imaging to rule out stroke as a cause for her transglobal amnesia.  Her pacemaker is for complete heart block on orthostatic vital signs were notably positive today.  She did not have any heart rate response despite the drop in blood pressure.  I would also be concerned for orthostatics causing her to fall and hit her head.  Given that she has complete heart block and pacemaker I think would be reasonable to ask cardiology to come by and evaluate her.  Reviewing her medication list metoprolol potentially could contribute but this is likely need medication.  She does not appear overtly dehydrated however her urine specific gravity is on the higher side.

## 2019-08-22 NOTE — Progress Notes (Signed)
  Echocardiogram 2D Echocardiogram has been performed.  Matilde Bash 08/22/2019, 2:41 PM

## 2019-08-22 NOTE — Progress Notes (Addendum)
NEURO HOSPITALIST PROGRESS NOTE   Subjective: Awake, alert, NAD, daughter at bedside.   discussed POC and that patient is not to drive for 6 months unless cleared by cardiology/ and/or neurology to do so before 6 months. Patient and family verbalized an understanding.  Exam: Vitals:   08/22/19 0314 08/22/19 0814  BP: 107/62 109/64  Pulse: 87 71  Resp: 16 18  Temp: (!) 97.5 F (36.4 C) 98.9 F (37.2 C)  SpO2: 97% 100%    Physical Exam  Constitutional: Appears well-developed and well-nourished.  Psych: Affect appropriate to situation Eyes: Normal external eye and conjunctiva. HENT: right eye bruised Cardiovascular: Normal rate and regular rhythm.  Respiratory: Effort normal, non-labored breathing saturations WNL GI: Soft.  No distension. There is no tenderness.  Skin: WDI   Neuro:  MS: awake, alert, oriented to self. Able to follow commands. Memory beginning to return Cranial nerves: Pupils equal round reactive to light, extraocular movements intact, visual fields full, face appears symmetric, tongue and palate midline  Motor  Antigravity in all 4 extremities with full strength 5/5 in all fours. Normal tone.  Normal range of motion. Sensory : Intact light touch without any extinction on double simultaneous stimulation. No evidence of dysmetria on finger-nose-finger testing. Gait testing was deferred at this time  Medications:  Scheduled: . calcium-vitamin D  1 tablet Oral Daily  . FLUoxetine  20 mg Oral Daily  . LORazepam  1 mg Intravenous Once  . metoprolol succinate  50 mg Oral Daily  . senna  1 tablet Oral BID  . sodium chloride flush  3 mL Intravenous Once   Continuous:  [id number], ALPRAZolam, ondansetron **OR** ondansetron (ZOFRAN) IV  Pertinent Labs/Diagnostics:   CT Angio Head W/Cm &/Or Wo Cm  Result Date: 08/21/2019 CLINICAL DATA:  65 year old female status post fall. Memory/Neurologic deficit. EXAM: CT ANGIOGRAPHY HEAD  TECHNIQUE: Multidetector CT imaging of the head was performed using the standard protocol during bolus administration of intravenous contrast. Multiplanar CT image reconstructions and MIPs were obtained to evaluate the vascular anatomy. CONTRAST:  167mL OMNIPAQUE IOHEXOL 350 MG/ML SOLN COMPARISON:  CT head face and cervical spine earlier today. FINDINGS: CT HEAD Brain: Stable non contrast CT appearance of the brain. Calvarium and skull base: No acute osseous abnormality identified. Paranasal sinuses: Visualized paranasal sinuses and mastoids are stable and well pneumatized. Orbits: Stable orbit and scalp soft tissues. CTA HEAD Posterior circulation: Fairly codominant distal vertebral arteries are patent. There is bilateral proximal V4 calcified plaque. Subsequent mild to moderate left and moderate to severe right proximal V4 stenosis (series 16, image 155). This is proximal to the PICA origins which remain patent. No distal V4 or vertebrobasilar junction plaque or stenosis. Patent basilar artery without stenosis. Normal SCA and PCA origins. Posterior communicating arteries are diminutive or absent. Bilateral PCA branches are within normal limits. Anterior circulation: Both ICA siphons are patent. Mildly tortuous ICAs just below the skull base. Minimal to mild bilateral siphon calcified plaque without stenosis. Patent carotid termini. Normal MCA and ACA origins. Anterior communicating artery and visible ACA branches are within normal limits. Left MCA M1 segment, bifurcation, and left MCA branches are within normal limits. Right MCA M1 segment, bifurcation, and right MCA branches are within normal limits. Venous sinuses: Early contrast timing, not well evaluated. Grossly patent superior sagittal sinus, transverse sinuses. Anatomic variants: None. Other findings: Grossly stable brain and head  from earlier today. Cardiac pacemaker visible on scout view which includes the chest. Review of the MIP images confirms the  above findings IMPRESSION: 1. Negative for large vessel occlusion. No intracranial aneurysm identified. 2. Positive for distal vertebral artery calcified plaque with up to Severe Right and Moderate Left V4 segment stenoses. 3. No significant anterior circulation stenosis. 4.  Stable CT appearance of the brain since 1159 hours today. Electronically Signed   By: Genevie Ann M.D.   On: 08/21/2019 18:04   DG Chest 2 View  Result Date: 08/21/2019 CLINICAL DATA:  Chest pain. EXAM: CHEST - 2 VIEW COMPARISON:  May 29, 2010. FINDINGS: The heart size and mediastinal contours are within normal limits. Both lungs are clear. Left-sided pacemaker is unchanged in position. No pneumothorax or pleural effusion is noted. The visualized skeletal structures are unremarkable. IMPRESSION: No active cardiopulmonary disease. Electronically Signed   By: Marijo Conception M.D.   On: 08/21/2019 11:15   CT Head Wo Contrast  Result Date: 08/21/2019 CLINICAL DATA:  [place]. Hit head. EXAM: CT HEAD WITHOUT CONTRAST CT MAXILLOFACIAL WITHOUT CONTRAST CT CERVICAL SPINE WITHOUT CONTRAST TECHNIQUE: Multidetector CT imaging of the head, cervical spine, and maxillofacial structures were performed using the standard protocol without intravenous contrast. Multiplanar CT image reconstructions of the cervical spine and maxillofacial structures were also generated. COMPARISON:  None. FINDINGS: CT HEAD FINDINGS Brain: The ventricles are in the midline without mass effect or shift. They are normal in size and configuration for age. The gray-white differentiation is maintained. No extra-axial fluid collections are identified. No mass lesions. No findings for hemispheric infarction or intracranial hemorrhage. Brainstem and cerebellum appear normal. Vascular: Vascular calcifications but no aneurysm or hyperdense vessels. Skull: No skull fracture or bone lesions. Other: No scalp lesions or scalp hematoma. CT MAXILLOFACIAL FINDINGS Osseous: No acute facial bone  fractures are identified. The mandibular condyles are normally located. Significant bilateral TMJ degenerative changes. No mandible fracture is identified. Orbits: No orbital fractures. The globes are intact. Sinuses: The paranasal sinuses and mastoid air cells are clear. Soft tissues: No obvious laceration, contusion or hematoma. CT CERVICAL SPINE FINDINGS Alignment: Degenerative posterior subluxation of C5 compared to C4 and C6. This is due to significant facet disease. Skull base and vertebrae: No acute fracture. No primary bone lesion or focal pathologic process. Soft tissues and spinal canal: No prevertebral fluid or swelling. No visible canal hematoma. Disc levels: The spinal canal is quite generous. No significant spinal stenosis. No large disc protrusions are identified. Upper chest: The lung apices are grossly clear. Biapical pleural and parenchymal scarring changes are noted. Other: Age advanced vertebral artery calcifications. Surgical changes with some type of radiopaque material in the region of the true cords bilaterally. IMPRESSION: 1. No acute intracranial findings or skull fracture. 2. No acute facial bone fractures are identified. 3. 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 4. No acute cervical spine fracture. 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 Electronically Signed   By: Marijo Sanes M.D.   On: 08/21/2019 12:23   CT Cervical Spine Wo Contrast  Result Date: 08/21/2019 CLINICAL DATA:  [place]. Hit head. EXAM: CT HEAD WITHOUT CONTRAST CT MAXILLOFACIAL WITHOUT CONTRAST CT CERVICAL SPINE WITHOUT CONTRAST TECHNIQUE: Multidetector CT imaging of the head, cervical spine, and maxillofacial structures were performed using the standard protocol without intravenous contrast. Multiplanar CT image reconstructions of the cervical spine and maxillofacial structures were also generated. COMPARISON:  None. FINDINGS: CT HEAD FINDINGS  Brain:  The ventricles are in the midline without mass effect or shift. They are normal in size and configuration for age. The gray-white differentiation is maintained. No extra-axial fluid collections are identified. No mass lesions. No findings for hemispheric infarction or intracranial hemorrhage. Brainstem and cerebellum appear normal. Vascular: Vascular calcifications but no aneurysm or hyperdense vessels. Skull: No skull fracture or bone lesions. Other: No scalp lesions or scalp hematoma. CT MAXILLOFACIAL FINDINGS Osseous: No acute facial bone fractures are identified. The mandibular condyles are normally located. Significant bilateral TMJ degenerative changes. No mandible fracture is identified. Orbits: No orbital fractures. The globes are intact. Sinuses: The paranasal sinuses and mastoid air cells are clear. Soft tissues: No obvious laceration, contusion or hematoma. CT CERVICAL SPINE FINDINGS Alignment: Degenerative posterior subluxation of C5 compared to C4 and C6. This is due to significant facet disease. Skull base and vertebrae: No acute fracture. No primary bone lesion or focal pathologic process. Soft tissues and spinal canal: No prevertebral fluid or swelling. No visible canal hematoma. Disc levels: The spinal canal is quite generous. No significant spinal stenosis. No large disc protrusions are identified. Upper chest: The lung apices are grossly clear. Biapical pleural and parenchymal scarring changes are noted. Other: Age advanced vertebral artery calcifications. Surgical changes with some type of radiopaque material in the region of the true cords bilaterally. IMPRESSION: 1. No acute intracranial findings or skull fracture. 2. No acute facial bone fractures are identified. 3. 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 4. No acute cervical spine fracture. 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 Electronically Signed   By: Marijo Sanes M.D.   On: 08/21/2019 12:23   EEG adult  Result Date: 08/21/2019 Alexis Goodell, MD     08/21/2019  9:34 PM ELECTROENCEPHALOGRAM REPORT Patient: DANESIA HUBER       Room #: [id number] EEG No. ID: [id number] Age: 64 y.o.        Sex: female Requesting Physician: Heber [place] Report Date:  08/21/2019       Interpreting Physician: Alexis Goodell History: CATALAYA GALLARDO is an 64 y.o. female with altered mental status Medications: Prozac, Toprol Conditions of Recording:  This is a 21 channel routine scalp EEG performed with bipolar and monopolar montages arranged in accordance to the international 10/20 system of electrode placement. One channel was dedicated to EKG recording. The patient is in the awake and drowsy states. Description:  The waking background activity consists of a low voltage, symmetrical, fairly well organized, 8-9 Hz alpha activity, seen from the parieto-occipital and posterior temporal regions.  Low voltage fast activity, poorly organized, is seen anteriorly and is at times superimposed on more posterior regions.  A mixture of theta and alpha rhythms are seen from the central and temporal regions. The patient drowses with slowing to irregular, low voltage theta and beta activity.  Stage II sleep is not obtained. No epileptiform activity is noted.  Hyperventilation and intermittent photic stimulation were not performed. IMPRESSION: Normal electroencephalogram, awake and drowsy. There are no focal lateralizing or epileptiform features. Alexis Goodell, MD Neurology [phone] 08/21/2019, 9:32 PM   CT Maxillofacial WO CM  Result Date: 08/21/2019 CLINICAL DATA:  [place]. Hit head. EXAM: CT HEAD WITHOUT CONTRAST CT MAXILLOFACIAL WITHOUT CONTRAST CT CERVICAL SPINE WITHOUT CONTRAST TECHNIQUE: Multidetector CT imaging of the head, cervical spine, and maxillofacial structures were performed using the standard protocol without intravenous contrast. Multiplanar CT image reconstructions of the cervical  spine and maxillofacial structures were also generated.  COMPARISON:  None. FINDINGS: CT HEAD FINDINGS Brain: The ventricles are in the midline without mass effect or shift. They are normal in size and configuration for age. The gray-white differentiation is maintained. No extra-axial fluid collections are identified. No mass lesions. No findings for hemispheric infarction or intracranial hemorrhage. Brainstem and cerebellum appear normal. Vascular: Vascular calcifications but no aneurysm or hyperdense vessels. Skull: No skull fracture or bone lesions. Other: No scalp lesions or scalp hematoma. CT MAXILLOFACIAL FINDINGS Osseous: No acute facial bone fractures are identified. The mandibular condyles are normally located. Significant bilateral TMJ degenerative changes. No mandible fracture is identified. Orbits: No orbital fractures. The globes are intact. Sinuses: The paranasal sinuses and mastoid air cells are clear. Soft tissues: No obvious laceration, contusion or hematoma. CT CERVICAL SPINE FINDINGS Alignment: Degenerative posterior subluxation of C5 compared to C4 and C6. This is due to significant facet disease. Skull base and vertebrae: No acute fracture. No primary bone lesion or focal pathologic process. Soft tissues and spinal canal: No prevertebral fluid or swelling. No visible canal hematoma. Disc levels: The spinal canal is quite generous. No significant spinal stenosis. No large disc protrusions are identified. Upper chest: The lung apices are grossly clear. Biapical pleural and parenchymal scarring changes are noted. Other: Age advanced vertebral artery calcifications. Surgical changes with some type of radiopaque material in the region of the true cords bilaterally. IMPRESSION: 1. No acute intracranial findings or skull fracture. 2. No acute facial bone fractures are identified. 3. 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 4. No acute cervical spine fracture. Age advanced  degenerative cervical spondylosis with multilevel disc disease and facet disease. Electronically Signed   By: Marijo Sanes M.D.   On: 08/21/2019 12:23  Laurey Morale, MSN, NP-C [facility] [phone] 08/22/2019, 10:19 AM   Attending addendum Patient seen and examined Agree with the history and physical documented above I have independently reviewed the exam. I independently reviewed imaging  LDL 141 A1c 5.9. TSH 2.3  Assessment:  64 year old with above past medical history for evaluation of altered mental status Repetitive questioning more suggestive of TGA Has evidence of face and head injury, making syncope versus seizure also on the differentials. Not anticoagulated A. fib make TIA as the etiology for the TGA also possible. Postconcussive short-term amnesia also possibility. Vascular imaging of the head completed-neck pending. Vascular imaging of the head shows distal vertebral artery calcified plaque with up to severe right and moderate left V4 segment stenoses but basilar is patent. VBI can be considered but is lower on differential. Given completely normal EEG, my suspicion for seizure is low.  That does not mean a postconcussive seizure or a post syncopal convulsion could not have happened, but she does not warrant antiepileptics at this time. Her return back to baseline does make me more suspicious for TGA but in a patient with nonanticoagulated atrial fibrillation, hippocampal TIA should also be considered very high on the differentials, hence warranting anticoagulation. Cardiac causes of syncope should also be evaluated  Recommendations: -Maintain seizure precautions -No need for antiepileptics at this time. EEG negative for seizure or epileptiform discharges. -Obtain a CT angiogram of the head and neck to evaluate for any vertebrobasilar insufficiency or other vascular obstruction or stenosis. -Repeat head imaging for any evidence of evolving  stroke -Obtain a 2D echocardiogram -Frequent neurochecks -Telemetry -Check orthostatics -Check A1c and lipid panel -Repeat head CT 24 hours from the last CT head that was obtained at noon today.  Order CT head without  contrast for 08/22/2019 at noon.  CTA head was obtained but CTA neck was not-has been ordered now. -Would recommend anticoagulation in the setting of possible TIA and atrial fibrillation.  Need cardiology to weigh in to see if she needs antiplatelet with anticoagulants given her cardiac history. -I would keep her blood pressure goals to have her systolics closer to [id number] as a norm given the multiple vascular stenosis in the posterior circulation to avoid any hypoperfusion to the brain/VBI kind of symptoms. Pending imaging, plan was briefly discussed with the primary team attending Dr. Heber [place].  Further discussions after availability of the above results.  -- Amie Portland, MD [facility] Pager: [phone] If 7pm to 7am, please call on call as listed on AMION.

## 2019-08-22 NOTE — Progress Notes (Signed)
ANTICOAGULATION CONSULT NOTE - Initial Consult  Pharmacy Consult for Xarelto Indication: atrial fibrillation  Allergies  Allergen Reactions  . Erythromycin   . Sulfa Antibiotics Rash    Urticarial rash developed 1 day after completing a 14 day course of TMP/SMX    Patient Measurements: Height: 5\' 7"  (170.2 cm) Weight: 64.4 kg (142 lb) IBW/kg (Calculated) : 61.6  Vital Signs: Temp: 98.9 F (37.2 C) (05/21 0814) Temp Source: Oral (05/21 0814) BP: 109/64 (05/21 0814) Pulse Rate: 71 (05/21 0814)  Labs: Recent Labs    08/21/19 1047 08/21/19 1325 08/22/19 0419  HGB 13.1  --  12.9  HCT 40.5  --  39.1  PLT 249  --  209  CREATININE 1.03*  --  0.92  TROPONINIHS 7 10  --     Estimated Creatinine Clearance: 59.3 mL/min (by C-G formula based on SCr of 0.92 mg/dL).   Medical History: Past Medical History:  Diagnosis Date  . Allergic rhinitis   . Asthmatic bronchitis   . Complete heart block ([place])   . GERD (gastroesophageal reflux disease)   . MRSA (methicillin resistant Staphylococcus aureus) 2013   left buttock  . Nonsustained ventricular tachycardia ([place])   . 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 Vaginitis   . Vocal cord paresis    Assessment:  64 yr old female to begin Xarelto for atrial fibrillation.  Fell PTA, possible syncopal episode. Hx PAF and CHB, has pacemaker. Was on ASA 81 mg daily PTA.  Head CT negative for bleed.  Repeating head CT today. IM notes neuro ok with starting anticoagulation.  Goal of Therapy:  appropriate Xarelto dose for indication Monitor platelets by anticoagulation protocol: Yes   Plan:   Xarelto 20 mg daily with supper.  Follow up repeat head CT.  Arty Baumgartner, [place] Phone: [phone] 08/22/2019,11:17 AM

## 2019-08-23 DIAGNOSIS — I480 Paroxysmal atrial fibrillation: Secondary | ICD-10-CM

## 2019-08-23 DIAGNOSIS — G459 Transient cerebral ischemic attack, unspecified: Secondary | ICD-10-CM

## 2019-08-23 DIAGNOSIS — R55 Syncope and collapse: Secondary | ICD-10-CM

## 2019-08-23 LAB — ECHOCARDIOGRAM COMPLETE
Height: 67 in
Weight: 2272 oz

## 2019-08-23 MED ORDER — ATORVASTATIN CALCIUM 10 MG PO TABS
20.0000 mg | ORAL_TABLET | Freq: Every day | ORAL | Status: DC
  Administered 2019-08-23: 20 mg via ORAL
  Filled 2019-08-23: qty 2

## 2019-08-23 MED ORDER — RIVAROXABAN 20 MG PO TABS: 20.0000 mg | ORAL_TABLET | Freq: Every day | ORAL | 0 refills | Status: DC

## 2019-08-23 MED ORDER — ATORVASTATIN CALCIUM 20 MG PO TABS: 20.0000 mg | ORAL_TABLET | Freq: Every day | ORAL | 0 refills | Status: DC

## 2019-08-23 NOTE — Progress Notes (Signed)
Neurology Progress Note   S:// Seen and examined. Reports some soreness on the left eye and left side of her head.   O:// Current vital signs: BP 136/75 (BP Location: Right Arm)   Pulse 70   Temp 98.1 F (36.7 C) (Oral)   Resp 18   Ht 5\' 7"  (1.702 m)   Wt 64.4 kg   SpO2 100%   BMI 22.24 kg/m  Vital signs in last 24 hours: Temp:  [97.6 F (36.4 C)-98.9 F (37.2 C)] 98.1 F (36.7 C) (05/22 0415) Pulse Rate:  [70-90] 70 (05/22 0415) Resp:  [12-18] 18 (05/22 0415) BP: (79-136)/(60-75) 136/75 (05/22 0415) SpO2:  [97 %-100 %] 100 % (05/22 0415) Awake alert in no distress Normocephalic, bruise over the left eye Tongue bite improving Regular rate rhythm Chest clear No pedal edema Neurological exam Awake alert oriented x3 No dysarthria No aphasia Cranial nerves II to XII intact Motor exam: 5/5 in all fours without drift Sensory exam: Intact to touch all over Coordination: No dysmetria Gait testing deferred at this time  Medications  Current Facility-Administered Medications:  .  acetaminophen (TYLENOL) tablet 650 mg, 650 mg, Oral, Q6H PRN, Madalyn Rob, MD, 650 mg at 08/23/19 0706 .  ALPRAZolam Duanne Moron) tablet 0.25 mg, 0.25 mg, Oral, QHS PRN, Christian, Rylee, MD, 0.25 mg at 08/22/19 2141 .  calcium-vitamin D (OSCAL WITH D) 500-200 MG-UNIT per tablet 1 tablet, 1 tablet, Oral, Daily, Christian, Rylee, MD, 1 tablet at 08/22/19 1029 .  FLUoxetine (PROZAC) capsule 20 mg, 20 mg, Oral, Daily, Christian, Rylee, MD, 20 mg at 08/22/19 1030 .  LORazepam (ATIVAN) injection 1 mg, 1 mg, Intravenous, Once, Mitzi Hansen, MD, Stopped at 08/21/19 1340 .  metoprolol succinate (TOPROL-XL) 24 hr tablet 50 mg, 50 mg, Oral, Daily, Christian, Rylee, MD, 50 mg at 08/22/19 1030 .  ondansetron (ZOFRAN) tablet 4 mg, 4 mg, Oral, Q6H PRN **OR** ondansetron (ZOFRAN) injection 4 mg, 4 mg, Intravenous, Q6H PRN, Christian, Rylee, MD .  rivaroxaban (XARELTO) tablet 20 mg, 20 mg, Oral, Q supper, Skeet Simmer, RPH, 20 mg at 08/22/19 [id number] .  senna (SENOKOT) tablet 8.6 mg, 1 tablet, Oral, BID, Christian, Rylee, MD, 8.6 mg at 08/22/19 2141 .  sodium chloride flush (NS) 0.9 % injection 3 mL, 3 mL, Intravenous, Once, Mitzi Hansen, MD Labs CBC    Component Value Date/Time   WBC 6.0 08/22/2019 0419   RBC 3.96 08/22/2019 0419   HGB 12.9 08/22/2019 0419   HGB 12.9 03/28/2018 0858   HCT 39.1 08/22/2019 0419   HCT 38.3 03/28/2018 0858   PLT 209 08/22/2019 0419   PLT 261 03/28/2018 0858   MCV 98.7 08/22/2019 0419   MCV 97 03/28/2018 0858   MCH 32.6 08/22/2019 0419   MCHC 33.0 08/22/2019 0419   RDW 12.2 08/22/2019 0419   RDW 12.1 (L) 03/28/2018 0858   LYMPHSABS 1.6 03/28/2018 0858   MONOABS 0.7 05/30/2010 0420   EOSABS 0.2 03/28/2018 0858   BASOSABS 0.0 03/28/2018 0858    CMP     Component Value Date/Time   NA 140 08/22/2019 0419   NA 144 03/28/2018 0858   K 3.7 08/22/2019 0419   CL 107 08/22/2019 0419   CO2 25 08/22/2019 0419   GLUCOSE 97 08/22/2019 0419   BUN 19 08/22/2019 0419   BUN 19 03/28/2018 0858   CREATININE 0.92 08/22/2019 0419   CREATININE 0.99 07/14/2015 1414   CALCIUM 8.8 (L) 08/22/2019 0419   PROT 5.8 (L) 08/22/2019 [id number]  PROT 6.3 03/28/2018 0858   ALBUMIN 3.3 (L) 08/22/2019 0419   ALBUMIN 4.2 03/28/2018 0858   AST 17 08/22/2019 0419   ALT 14 08/22/2019 0419   ALKPHOS 66 08/22/2019 0419   BILITOT 0.6 08/22/2019 0419   BILITOT 0.3 03/28/2018 0858   GFRNONAA >60 08/22/2019 0419   GFRAA >60 08/22/2019 0419    glycosylated hemoglobin  Lipid Panel     Component Value Date/Time   CHOL 223 (H) 08/21/2019 2100   CHOL 215 (H) 03/28/2018 0858   TRIG 55 08/21/2019 2100   HDL 71 08/21/2019 2100   HDL 61 03/28/2018 0858   CHOLHDL 3.1 08/21/2019 2100   VLDL 11 08/21/2019 2100   LDLCALC 141 (H) 08/21/2019 2100   LDLCALC 134 (H) 03/28/2018 0858   -POSITIVE ORTHOSTATICS -2D echo unremarkable -A1c 5.9 -LDL 141  Imaging I have reviewed images in epic  and the results pertinent to this consultation are: Repeat CTH unremarkable CTA head and neck with calcified and stenotic vert but patent basilar and no flow-limiting stenosis.  EEG normal  Impression: -Orthostatic hypotension possibly the cause of fall  -TGA v TIA-2 CT heads separated by 24 hours with no evidence of evolving stroke. -Fall with head trauma - concussive etiology might also be at play -Unlikely to be a seizure. -Stenosis of the vertebral arteries  Recommendations: -Management of orthostasis per primary team as you are-I would recommend keeping her goal systolic blood pressures higher than normal (closer to 140) given posterior circulation stenoses. -Anticoagulation for underlying Afib.  Ideally should be on antiplatelet also but will defer the addition of antiplatelet to cardiology given her extensive cardiac history and now possible TIA and posterior circulation stenoses.  This is going to be an ongoing discussion with cardiology and neurology as an outpatient given orthostasis and possible history of falls and "clumsiness" as she describes. -No driving given unwitnessed [place] state law not unless 6 months episode free. -Statin for LDL below 70. -Cardiology follow-up-has an appointment with outpatient cardiologist on June 9. -Outpatient neurology follow-up-refer to outpatient clinic for follow-up in 2 to 4 weeks ([facility] or [facility])  Inpatient neurology will be available for any questions that you might have.  Please feel free to call. -- Amie Portland, MD [facility] Pager: [phone] If 7pm to 7am, please call on call as listed on AMION.

## 2019-08-23 NOTE — Care Management (Signed)
Provided with 30 day Xaralto card

## 2019-08-23 NOTE — Discharge Summary (Signed)
Name: Brandy [name] MRN: [id number] DOB: [birth year] 64 y.o. PCP: Patient, No Pcp Per  Date of Admission: 08/21/2019 10:26 AM Date of Discharge: 08/23/2019  5:45 PM Attending Physician: Lucious Groves, DO  Discharge Diagnosis: 1. Transient global amnesia 2. Paroxysmal atrial fibrillation 3. Anxiety/Depression  Discharge Medications: Allergies as of 08/23/2019      Reactions   Erythromycin    Sulfa Antibiotics Rash   Urticarial rash developed 1 day after completing a 14 day course of TMP/SMX      Medication List    STOP taking these medications   aspirin 81 MG tablet     TAKE these medications   ALPRAZolam 0.25 MG tablet Commonly known as: XANAX Take 0.25 mg by mouth at bedtime as needed for sleep.   atorvastatin 20 MG tablet Commonly known as: LIPITOR Take 1 tablet (20 mg total) by mouth daily.   Calcium 500 + D 500-125 MG-UNIT Tabs Generic drug: Calcium Carbonate-Vitamin D Take 1 tablet by mouth daily.   FLUoxetine 20 MG capsule Commonly known as: PROZAC Take 20 mg by mouth daily.   fluticasone 50 MCG/ACT nasal spray Commonly known as: FLONASE USE 1 SPRAY IN EACH NOSTRIL DAILY What changed: additional instructions   metoprolol succinate 50 MG 24 hr tablet Commonly known as: TOPROL-XL Take 1 tablet (50 mg total) by mouth daily. Take with or immediately following a meal.   rivaroxaban 20 MG Tabs tablet Commonly known as: XARELTO Take 1 tablet (20 mg total) by mouth daily with supper.   valACYclovir 1000 MG tablet Commonly known as: VALTREX Take 1 tablet by mouth daily as needed (cold sores).       Disposition and follow-up:   Ms.Brandy [name] was discharged from [facility] in Stable condition.  At the hospital follow up visit please address:  1.  Amnesia: TGA vs TIA, unclear etiology, seizure and CVA less likely. Follow up with neurology. Advised to avoid driving for 6 months given unwitnessed fall.   Paroxysmal atrial  fibrillation: In afib on admission, CHADSVASC 4, started on Xeralto. Advised to hold ASA for now.   2.  Labs / imaging needed at time of follow-up: None  3.  Pending labs/ test needing follow-up: None  Follow-up Appointments: Follow-up Information    Croitoru, Mihai, MD. Go to.   Specialty: Cardiology Why: Already scheduled appointment Contact information: [address] [place] [address] [phone]        [place]. Schedule an appointment as soon as possible for a visit.   Why: 2-4 weeks Contact information: [address]     [place]  [phone] [place] Hospital Course by problem list: 1. Transient global amnesia: This is a 64 year old female with a history of Paroxysmal a fib, complete heart block s/p pacemaker, and anxiety who presented after a fall with amnesia. She was brought in from home, does not know what happened and persistently forgot the fall episode even after being told about it. No other focal neurological deficits noted. CT head showed no acute abnormalities. CTA cervical spine no acute findings. Unable to obtain MRI due to pacemaker placement so obtained repeat CT head and CTAs the following day. Had improvement in her amnesia the following day. Repeat CTA showed bilateral proximal subclavian artery plaques, no significant stenosis, cervical spine and TMJ degeneration. Echocardiogram showed EF 50-55%, no WMA, mild LVH, otherwise unremarkable. She was  noted to be significantly orthostatic which was concerning since this could be the cause of her fall. Cardiology consulted and interrogated pacemaker. Pacemaker showed no abnormalities, felt to be move concerning for neurologic issue. Advised that she was stable for discharge with cardiology follow up. Patient discharged with cardiology follow up and will need to schedule neurology follow up.   2. Paroxysmal atrial fibrillation A fib  on admission, asymptomatic from this. CHADSVASC 4. There was discussion previously with cardiology about starting on anticoagulation. Given the unclear cause of her fall and amnesia, discussed risks and beenfits of starting anticoagulation. Started on [place], her ASA was held on discharge. She was in NSR on discharge.   3. Anxiety/Depression: Continued home prozac and xanax as needed.   Discharge Vitals:   BP 129/61 (BP Location: Left Arm)   Pulse 70   Temp 97.7 F (36.5 C) (Oral)   Resp 16   Ht 5\' 7"  (1.702 m)   Wt 64.4 kg   SpO2 97%   BMI 22.24 kg/m   Pertinent Labs, Studies, and Procedures:  CBC Latest Ref Rng & Units 08/22/2019 08/21/2019 03/28/2018  WBC 4.0 - 10.5 K/uL 6.0 7.8 4.4  Hemoglobin 12.0 - 15.0 g/dL 12.9 13.1 12.9  Hematocrit 36.0 - 46.0 % 39.1 40.5 38.3  Platelets 150 - 400 K/uL 209 249 261   BMP Latest Ref Rng & Units 08/22/2019 08/21/2019 03/28/2018  Glucose 70 - 99 mg/dL 97 108(H) 87  BUN 8 - 23 mg/dL 19 24(H) 19  Creatinine 0.44 - 1.00 mg/dL 0.92 1.03(H) 1.10(H)  BUN/Creat Ratio 12 - 28 - - 17  Sodium 135 - 145 mmol/L 140 138 144  Potassium 3.5 - 5.1 mmol/L 3.7 4.3 4.6  Chloride 98 - 111 mmol/L 107 103 104  CO2 22 - 32 mmol/L 25 26 25   Calcium 8.9 - 10.3 mg/dL 8.8(L) 9.7 9.8    08/21/19 CT Cervical spine, maxillofacial: IMPRESSION: 1. No acute intracranial findings or skull fracture. 2. No acute facial bone fractures are identified. 3. 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 4. No acute cervical spine fracture. 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   08/21/19 CT head: IMPRESSION: 1. No acute intracranial findings or skull fracture. 2. No acute facial bone fractures are identified. 3. 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 4. No acute cervical spine fracture. Age advanced degenerative cervical spondylosis with multilevel disc disease and facet  disease.  08/21/19 CT head: IMPRESSION: 1. Negative for large vessel occlusion. No intracranial aneurysm identified. 2. Positive for distal vertebral artery calcified plaque with up to Severe Right and Moderate Left V4 segment stenoses. 3. No significant anterior circulation stenosis. 4.  Stable CT appearance of the brain since 1159 hours today.   08/21/19 EEG:  IMPRESSION: Normal electroencephalogram, awake and drowsy. There are no focal lateralizing or epileptiform features.   08/22/19 CTA Head and neck: IMPRESSION: 1. Atherosclerosis is mild for age in the neck, with no extracranial carotid or vertebral artery stenosis. Mild generalized arterial tortuosity.  2. Bilateral proximal subclavian artery plaque without significant stenosis. 3. Cervical spine and TMJ degeneration.  08/22/19 CT head: IMPRESSION: 1. Continued stable non contrast CT appearance of the brain. 2. Mild for age cerebral white matter changes.  08/22/19 Echocardiogram: IMPRESSIONS    1. Left ventricular ejection fraction, by estimation, is 50 to 55%. The  left ventricle has low normal function. The left ventricle has no regional  wall motion abnormalities. There is mild left  ventricular hypertrophy.  Left ventricular diastolic  parameters are indeterminate.  2. Right ventricular systolic function is normal. The right ventricular  size is normal. There is normal pulmonary artery systolic pressure. The  estimated right ventricular systolic pressure is 123XX123 mmHg.  3. The mitral valve is normal in structure. No evidence of mitral valve  regurgitation. No evidence of mitral stenosis.  4. The aortic valve is normal in structure. Aortic valve regurgitation is  not visualized. No aortic stenosis is present.  5. The inferior vena cava is normal in size with greater than 50%  respiratory variability, suggesting right atrial pressure of 3 mmHg.   Conclusion(s)/Recommendation(s): No intracardiac  source of embolism  detected on this transthoracic study. A transesophageal echocardiogram is  recommended to exclude cardiac source of embolism if clinically indicated.     Discharge Instructions: Discharge Instructions    Call MD for:  difficulty breathing, headache or visual disturbances   Complete by: As directed    Call MD for:  extreme fatigue   Complete by: As directed    Call MD for:  hives   Complete by: As directed    Call MD for:  persistant dizziness or light-headedness   Complete by: As directed    Call MD for:  persistant nausea and vomiting   Complete by: As directed    Call MD for:  redness, tenderness, or signs of infection (pain, swelling, redness, odor or green/yellow discharge around incision site)   Complete by: As directed    Call MD for:  severe uncontrolled pain   Complete by: As directed    Call MD for:  temperature >100.4   Complete by: As directed    Diet - low sodium heart healthy   Complete by: As directed    Increase activity slowly   Complete by: As directed       Signed: Asencion Noble, MD 08/24/2019, 6:53 AM   Pager: [phone]

## 2019-08-23 NOTE — Progress Notes (Signed)
Subjective: Patient reports feeling okay today. Reports having some facial and neck discomfort. Denies any other complaints. She reports that she knows that she had a fall, still not able to remember the fall. Denies any light headedness, dizziness, fatigue, weakness, chest pain, or SOB. She reports having a follow up with cardiology.   Objective:  Vital signs in last 24 hours: Vitals:   08/22/19 1603 08/22/19 2015 08/22/19 2326 08/23/19 0415  BP: 116/67 119/60 132/69 136/75  Pulse: 90 74 72 70  Resp: 16 18 16 18   Temp: 98.8 F (37.1 C) 97.6 F (36.4 C) 98.1 F (36.7 C) 98.1 F (36.7 C)  TempSrc: Oral Oral Oral Oral  SpO2: 99% 98% 98% 100%  Weight:      Height:       General: Elderly female, NAD, laying in bed Cardiac: RRR, no m/r/g Pulmonary: CTABL, no wheezing, rhonchi, rales Abdomen: Soft, non-tender, non-distended, normoactive bowel sounds Neuro: Awake, alert, oriented x3, CN 2-12 grossly intact, 5/5 strength in upper and lower extremities, sensation to light touch intact.   CTA Head and neck:  IMPRESSION: 1. Atherosclerosis is mild for age in the neck, with no extracranial carotid or vertebral artery stenosis. Mild generalized arterial tortuosity.  2. Bilateral proximal subclavian artery plaque without significant stenosis. 3. Cervical spine and TMJ degeneration.  CT head: IMPRESSION: 1. Continued stable non contrast CT appearance of the brain. 2. Mild for age cerebral white matter changes.  08/22/19 Echocardiogram: IMPRESSIONS    1. Left ventricular ejection fraction, by estimation, is 50 to 55%. The  left ventricle has low normal function. The left ventricle has no regional  wall motion abnormalities. There is mild left ventricular hypertrophy.  Left ventricular diastolic  parameters are indeterminate.  2. Right ventricular systolic function is normal. The right ventricular  size is normal. There is normal pulmonary artery systolic pressure. The    estimated right ventricular systolic pressure is 123XX123 mmHg.  3. The mitral valve is normal in structure. No evidence of mitral valve  regurgitation. No evidence of mitral stenosis.  4. The aortic valve is normal in structure. Aortic valve regurgitation is  not visualized. No aortic stenosis is present.  5. The inferior vena cava is normal in size with greater than 50%  respiratory variability, suggesting right atrial pressure of 3 mmHg.   Conclusion(s)/Recommendation(s): No intracardiac source of embolism  detected on this transthoracic study. A transesophageal echocardiogram is  recommended to exclude cardiac source of embolism if clinically indicated.   Assessment/Plan:  Active Problems:   CHB (complete heart block) (HCC)   Pacemaker - [employer]tronic dual-chamber implanted 2010   Paroxysmal atrial fibrillation (HCC)   Global amnesia   Orthostatic hypotension  This is a 64 year old female with a history of Paroxysmal a fib and complete heart block s/p pacemaker who presented after a fall and noted to have amnesia.  Transient Global Amnesia:  Unclear etiology, TGA vs TIA vs concussion. Improved yesterday, continues to improve today. She did know she had a fall however still not able to recall the fall. EEG showed no abnormalities, no evidence of seizures. Echocardiogram showed normal EF, no intracardiac source of embolism detected on study. CTA head and neck showed no acute abnormalities yesterday. Positive orthostatics noted yesterday, this could be the cause of her fall, cardiology consulted regarding pacemaker and complete heart block.  -Neurology following, appreciate recommendations --No driving given unwitnessed LOC --Cardiology follow up --Neurology follow up in 2-4 weeks per neuro -F/u cardiology recommendations -  Possible discharge today  Paroxysmal atrial fibrirallation: -CHADSVASC score of 3. Started on anticoagulation yesterday for this. No evidence of bleeding on  imaging. Currently rate controlled.  -Continue Xeralto 20 mg daily   Prior to Admission Living Arrangement: Home Anticipated Discharge Location: Home Barriers to Discharge: None Dispo: Anticipated discharge in approximately 0-1 day(s).   Asencion Noble, MD 08/23/2019, 6:54 AM Pager: [phone]

## 2019-08-23 NOTE — Consult Note (Signed)
Cardiology Consultation:   Patient ID: Brandy [name] MRN: [id number]; DOB: [birth year]  Admit date: 08/21/2019 Date of Consult: 08/23/2019  Primary Care Provider: Patient, No Pcp Per Primary Cardiologist: Sanda Klein, MD  Primary Electrophysiologist:  None    Patient Profile:   Brandy [name] is a 64 y.o. female with a hx of atrial fibrillation and a permanent pacemaker who is being seen today for the evaluation of altered mental status at the request of Dr. Sherry Ruffing .  History of Present Illness:   Brandy [name] who came to the emergency room after a fall.  This was unwitnessed and she does not remember the event.  She reports that she was out hanging the clothes.  She does not remember anything.  She apparently came in and laid on the bed after having frank syncope and striking her head and the eye.  She was able to call her daughter who is a Marine scientist.  She told her daughter that her heart had been feeling funny but she does not remember any of this.  Her daughter brought her to the emergency room.  She remembers when she got upstairs from the ER but does not remember any of the events prior to that.  She has been clear since that time.  She has not been having any orthostatic symptoms.  She has not had syncope.  She has not been having any chest pressure, neck or arm discomfort.  She does not recall anything about her heart feeling out of rhythm.  She has had no new shortness of breath, PND or orthopnea.  She has a history of complete heart block status post dual-chamber murmur current pacemaker.  She also has paroxysmal atrial fibrillation.  She has not been on anticoagulation.  I do note that her most recent pacemaker download was unremarkable.  She has had very brief episodes of atrial fibrillation with a total burden of 0.3%.  She had nonsustained VT with the longest being 16 beats.  She had a calcium score of 0.  Echo in this admission was unremarkable with low normal ejection fraction.    She has been seen by neurology who is evaluating either transient global amnesia versus TIA.  I think is unlikely to be a seizure.  EEG.  Of note she was unable to be sent for MRI because of an incompatible device.  She is noted to be orthostatic.   Past Medical History:  Diagnosis Date  . Allergic rhinitis   . Asthmatic bronchitis   . Complete heart block ([place])   . GERD (gastroesophageal reflux disease)   . MRSA (methicillin resistant Staphylococcus aureus) 2013   left buttock  . Nonsustained ventricular tachycardia ([place])   . 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 Vaginitis   . Vocal cord paresis     Past Surgical History:  Procedure Laterality Date  . ACROMIO-CLAVICULAR JOINT REPAIR Left 12/10/2014   Procedure: LEFT CORACOCLAVICULAR RECONSTRUCTION WITH  ZIPLOOP;  Surgeon: Tania Ade, MD;  Location: [facility];  Service: Orthopedics;  Laterality: Left;  . APPENDECTOMY  05-2010  . DILATION AND CURETTAGE OF UTERUS  1978  . HAND SURGERY    . PACEMAKER INSERTION  08/05/08   Medtronic Adapta  . Vocal cord splint  2005     Home Medications:  Prior to Admission medications   Medication Sig Start Date End Date Taking? Authorizing Provider  ALPRAZolam Duanne Moron) 0.25 MG tablet Take 0.25 mg by mouth at bedtime  as needed for sleep.    Yes [provider]  aspirin 81 MG tablet Take 81 mg by mouth daily.   Yes [provider]  Calcium Carbonate-Vitamin D (CALCIUM 500 + D) 500-125 MG-UNIT TABS Take 1 tablet by mouth daily.     Yes [provider]  FLUoxetine (PROZAC) 20 MG capsule Take 20 mg by mouth daily.  06/06/16  Yes [provider]  fluticasone (FLONASE) 50 MCG/ACT nasal spray USE 1 SPRAY IN EACH NOSTRIL DAILY Patient taking differently: Place 1 spray into both nostrils daily. USE 1 SPRAY IN EACH NOSTRIL DAILY 10/30/16  Yes Young, Clinton D, MD  metoprolol succinate (TOPROL-XL) 50 MG 24 hr tablet Take 1  tablet (50 mg total) by mouth daily. Take with or immediately following a meal. 07/30/18  Yes Croitoru, Mihai, MD  valACYclovir (VALTREX) 1000 MG tablet Take 1 tablet by mouth daily as needed (cold sores).  06/16/16  Yes [provider]  predniSONE (DELTASONE) 20 MG tablet 1 tab three times a day for 2 days, 1 tab twice daily for 2 days, 1 tab daily for 2 days, 1/2 tab for days Patient not taking: Reported on 08/21/2019 12/02/18   Esaw Grandchild, NP    Inpatient Medications: Scheduled Meds: . calcium-vitamin D  1 tablet Oral Daily  . FLUoxetine  20 mg Oral Daily  . LORazepam  1 mg Intravenous Once  . metoprolol succinate  50 mg Oral Daily  . rivaroxaban  20 mg Oral Q supper  . senna  1 tablet Oral BID  . sodium chloride flush  3 mL Intravenous Once   Continuous Infusions:  PRN Meds: acetaminophen, ALPRAZolam, ondansetron **OR** ondansetron (ZOFRAN) IV  Allergies:    Allergies  Allergen Reactions  . Erythromycin   . Sulfa Antibiotics Rash    Urticarial rash developed 1 day after completing a 14 day course of TMP/SMX    Social History:   Social History   Socioeconomic History  . Marital status: Widowed    Spouse name: Not on file  . Number of children: Not on file  . Years of education: Not on file  . Highest education level: Not on file  Occupational History  . Not on file  Tobacco Use  . Smoking status: Never Smoker  . Smokeless tobacco: Never Used  Substance and Sexual Activity  . Alcohol use: Not Currently  . Drug use: Never  . Sexual activity: Not Currently    Birth control/protection: None  Other Topics Concern  . Not on file  Social History Narrative  . Not on file   Social Determinants of Health   Financial Resource Strain:   . Difficulty of Paying Living Expenses:   Food Insecurity:   . Worried About Charity fundraiser in the Last Year:   . Arboriculturist in the Last Year:   Transportation Needs:   . Film/video editor (Medical):   Marland Kitchen  Lack of Transportation (Non-Medical):   Physical Activity:   . Days of Exercise per Week:   . Minutes of Exercise per Session:   Stress:   . Feeling of Stress :   Social Connections:   . Frequency of Communication with Friends and Family:   . Frequency of Social Gatherings with Friends and Family:   . Attends Religious Services:   . Active Member of Clubs or Organizations:   . Attends Archivist Meetings:   Marland Kitchen Marital Status:   Intimate Partner Violence:   .  Fear of Current or Ex-Partner:   . Emotionally Abused:   Marland Kitchen Physically Abused:   . Sexually Abused:     Family History:    Family History  Problem Relation Age of Onset  . Hypertension Mother   . Coronary artery disease Mother   . Depression Mother   . Heart disease Mother   . Diabetes Father   . Heart disease Father   . Depression Father   . Hypertension Father   . Hypertension Sister   . Alcohol abuse Sister   . Depression Sister   . Diabetes Paternal Uncle   . Cancer Maternal Grandmother         colon  . Heart disease Maternal Grandfather   . Diabetes Maternal Grandfather   . Alcohol abuse Paternal Grandfather   . Heart disease Paternal Grandfather      ROS:  Please see the history of present illness.  None All other ROS reviewed and negative.     Physical Exam/Data:   Vitals:   08/22/19 2326 08/23/19 0415 08/23/19 0835 08/23/19 1209  BP: 132/69 136/75 133/70 129/60  Pulse: 72 70 70 70  Resp: 16 18 16 20   Temp: 98.1 F (36.7 C) 98.1 F (36.7 C) 98.5 F (36.9 C) 98.1 F (36.7 C)  TempSrc: Oral Oral Oral Oral  SpO2: 98% 100% 99% 99%  Weight:      Height:        Intake/Output Summary (Last 24 hours) at 08/23/2019 1210 Last data filed at 08/22/2019 1300 Gross per 24 hour  Intake 320 ml  Output --  Net 320 ml   Last 3 Weights 08/21/2019 12/02/2018 07/30/2018  Weight (lbs) 142 lb 147 lb 4.8 oz 142 lb  Weight (kg) 64.411 kg 66.815 kg 64.411 kg     Body mass index is 22.24 kg/m.   GENERAL:  Well appearing HEENT:  Pupils equal round and reactive, fundi not visualized, oral mucosa unremarkable NECK:  No jugular venous distention, waveform within normal limits, carotid upstroke brisk and symmetric, no bruits, no thyromegaly LYMPHATICS:  No cervical, inguinal adenopathy LUNGS:  Clear to auscultation bilaterally BACK:  No CVA tenderness CHEST:  Unremarkable HEART:  PMI not displaced or sustained,S1 and S2 within normal limits, no S3, no S4, no clicks, no rubs, no murmurs ABD:  Flat, positive bowel sounds normal in frequency in pitch, no bruits, no rebound, no guarding, no midline pulsatile mass, no hepatomegaly, no splenomegaly EXT:  2 plus pulses throughout, no edema, no cyanosis no clubbing SKIN:  No rashes no nodules NEURO:  Cranial nerves II through XII grossly intact, motor grossly intact throughout PSYCH:  Cognitively intact, oriented to person place and time   EKG:  The EKG was personally reviewed and demonstrates:  AV paced rhythm 100% capture Telemetry:  Telemetry was personally reviewed and demonstrates:  NSR  Relevant CV Studies:  ECHO:   1. Left ventricular ejection fraction, by estimation, is 50 to 55%. The  left ventricle has low normal function. The left ventricle has no regional  wall motion abnormalities. There is mild left ventricular hypertrophy.  Left ventricular diastolic  parameters are indeterminate.  2. Right ventricular systolic function is normal. The right ventricular  size is normal. There is normal pulmonary artery systolic pressure. The  estimated right ventricular systolic pressure is 123XX123 mmHg.  3. The mitral valve is normal in structure. No evidence of mitral valve  regurgitation. No evidence of mitral stenosis.  4. The aortic valve is normal in structure.  Aortic valve regurgitation is  not visualized. No aortic stenosis is present.  5. The inferior vena cava is normal in size with greater than 50%  respiratory variability,  suggesting right atrial pressure of 3 mmHg.    Laboratory Data:  High Sensitivity Troponin:   Recent Labs  Lab 08/21/19 1047 08/21/19 1325  TROPONINIHS 7 10     Chemistry Recent Labs  Lab 08/21/19 1047 08/22/19 0419  NA 138 140  K 4.3 3.7  CL 103 107  CO2 26 25  GLUCOSE 108* 97  BUN 24* 19  CREATININE 1.03* 0.92  CALCIUM 9.7 8.8*  GFRNONAA 57* >60  GFRAA >60 >60  ANIONGAP 9 8    Recent Labs  Lab 08/21/19 2100 08/22/19 0419  PROT 6.5 5.8*  ALBUMIN 3.7 3.3*  AST 20 17  ALT 17 14  ALKPHOS 79 66  BILITOT 0.9 0.6   Hematology Recent Labs  Lab 08/21/19 1047 08/22/19 0419  WBC 7.8 6.0  RBC 4.09 3.96  HGB 13.1 12.9  HCT 40.5 39.1  MCV 99.0 98.7  MCH 32.0 32.6  MCHC 32.3 33.0  RDW 12.1 12.2  PLT 249 209   BNPNo results for input(s): BNP, PROBNP in the last 168 hours.  DDimer No results for input(s): DDIMER in the last 168 hours.   Radiology/Studies:  CT Angio Head W/Cm &/Or Wo Cm  Result Date: 08/21/2019 CLINICAL DATA:  64 year old female status post fall. Memory/Neurologic deficit. EXAM: CT ANGIOGRAPHY HEAD TECHNIQUE: Multidetector CT imaging of the head was performed using the standard protocol during bolus administration of intravenous contrast. Multiplanar CT image reconstructions and MIPs were obtained to evaluate the vascular anatomy. CONTRAST:  157mL OMNIPAQUE IOHEXOL 350 MG/ML SOLN COMPARISON:  CT head face and cervical spine earlier today. FINDINGS: CT HEAD Brain: Stable non contrast CT appearance of the brain. Calvarium and skull base: No acute osseous abnormality identified. Paranasal sinuses: Visualized paranasal sinuses and mastoids are stable and well pneumatized. Orbits: Stable orbit and scalp soft tissues. CTA HEAD Posterior circulation: Fairly codominant distal vertebral arteries are patent. There is bilateral proximal V4 calcified plaque. Subsequent mild to moderate left and moderate to severe right proximal V4 stenosis (series 16, image 155).  This is proximal to the PICA origins which remain patent. No distal V4 or vertebrobasilar junction plaque or stenosis. Patent basilar artery without stenosis. Normal SCA and PCA origins. Posterior communicating arteries are diminutive or absent. Bilateral PCA branches are within normal limits. Anterior circulation: Both ICA siphons are patent. Mildly tortuous ICAs just below the skull base. Minimal to mild bilateral siphon calcified plaque without stenosis. Patent carotid termini. Normal MCA and ACA origins. Anterior communicating artery and visible ACA branches are within normal limits. Left MCA M1 segment, bifurcation, and left MCA branches are within normal limits. Right MCA M1 segment, bifurcation, and right MCA branches are within normal limits. Venous sinuses: Early contrast timing, not well evaluated. Grossly patent superior sagittal sinus, transverse sinuses. Anatomic variants: None. Other findings: Grossly stable brain and head from earlier today. Cardiac pacemaker visible on scout view which includes the chest. Review of the MIP images confirms the above findings IMPRESSION: 1. Negative for large vessel occlusion. No intracranial aneurysm identified. 2. Positive for distal vertebral artery calcified plaque with up to Severe Right and Moderate Left V4 segment stenoses. 3. No significant anterior circulation stenosis. 4.  Stable CT appearance of the brain since 1159 hours today. Electronically Signed   By: Genevie Ann M.D.   On: 08/21/2019 18:04  DG Chest 2 View  Result Date: 08/21/2019 CLINICAL DATA:  Chest pain. EXAM: CHEST - 2 VIEW COMPARISON:  May 29, 2010. FINDINGS: The heart size and mediastinal contours are within normal limits. Both lungs are clear. Left-sided pacemaker is unchanged in position. No pneumothorax or pleural effusion is noted. The visualized skeletal structures are unremarkable. IMPRESSION: No active cardiopulmonary disease. Electronically Signed   By: Marijo Conception M.D.   On:  08/21/2019 11:15   CT HEAD WO CONTRAST  Result Date: 08/22/2019 CLINICAL DATA:  64 year old female status post fall, confusion, altered mental status. EXAM: CT HEAD WITHOUT CONTRAST TECHNIQUE: Contiguous axial images were obtained from the base of the skull through the vertex without intravenous contrast. COMPARISON:  CT head and CTA head yesterday. FINDINGS: Brain: Stable cerebral volume, within normal limits for age. No midline shift, ventriculomegaly, mass effect, evidence of mass lesion, intracranial hemorrhage or evidence of cortically based acute infarction. Mild for age white matter hypodensity in the deep white matter capsules appears stable. Elsewhere normal gray-white matter differentiation. Vascular: Calcified atherosclerosis at the skull base. No suspicious intracranial vascular hyperdensity. Skull: Hyperostosis of the calvarium, normal variant. No acute osseous abnormality identified. Sinuses/Orbits: Visualized paranasal sinuses and mastoids are stable and well pneumatized. Other: Visualized orbits and scalp soft tissues are within normal limits. IMPRESSION: 1. Continued stable non contrast CT appearance of the brain. 2. Mild for age cerebral white matter changes. Electronically Signed   By: Genevie Ann M.D.   On: 08/22/2019 23:12   CT Head Wo Contrast  Result Date: 08/21/2019 CLINICAL DATA:  [place]. Hit head. EXAM: CT HEAD WITHOUT CONTRAST CT MAXILLOFACIAL WITHOUT CONTRAST CT CERVICAL SPINE WITHOUT CONTRAST TECHNIQUE: Multidetector CT imaging of the head, cervical spine, and maxillofacial structures were performed using the standard protocol without intravenous contrast. Multiplanar CT image reconstructions of the cervical spine and maxillofacial structures were also generated. COMPARISON:  None. FINDINGS: CT HEAD FINDINGS Brain: The ventricles are in the midline without mass effect or shift. They are normal in size and configuration for age. The gray-white differentiation is maintained. No  extra-axial fluid collections are identified. No mass lesions. No findings for hemispheric infarction or intracranial hemorrhage. Brainstem and cerebellum appear normal. Vascular: Vascular calcifications but no aneurysm or hyperdense vessels. Skull: No skull fracture or bone lesions. Other: No scalp lesions or scalp hematoma. CT MAXILLOFACIAL FINDINGS Osseous: No acute facial bone fractures are identified. The mandibular condyles are normally located. Significant bilateral TMJ degenerative changes. No mandible fracture is identified. Orbits: No orbital fractures. The globes are intact. Sinuses: The paranasal sinuses and mastoid air cells are clear. Soft tissues: No obvious laceration, contusion or hematoma. CT CERVICAL SPINE FINDINGS Alignment: Degenerative posterior subluxation of C5 compared to C4 and C6. This is due to significant facet disease. Skull base and vertebrae: No acute fracture. No primary bone lesion or focal pathologic process. Soft tissues and spinal canal: No prevertebral fluid or swelling. No visible canal hematoma. Disc levels: The spinal canal is quite generous. No significant spinal stenosis. No large disc protrusions are identified. Upper chest: The lung apices are grossly clear. Biapical pleural and parenchymal scarring changes are noted. Other: Age advanced vertebral artery calcifications. Surgical changes with some type of radiopaque material in the region of the true cords bilaterally. IMPRESSION: 1. No acute intracranial findings or skull fracture. 2. No acute facial bone fractures are identified. 3. 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 4. No acute cervical spine fracture. 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 Electronically Signed   By: Marijo Sanes M.D.   On: 08/21/2019 12:23   CT ANGIO NECK W OR WO CONTRAST  Result Date: 08/22/2019 CLINICAL DATA:  64 year old female status post fall, confusion,  altered mental status. EXAM: CT ANGIOGRAPHY NECK TECHNIQUE: Multidetector CT imaging of the neck was performed using the standard protocol during bolus administration of intravenous contrast. Multiplanar CT image reconstructions and MIPs were obtained to evaluate the vascular anatomy. Carotid stenosis measurements (when applicable) are obtained utilizing NASCET criteria, using the distal internal carotid diameter as the denominator. CONTRAST:  55mL OMNIPAQUE IOHEXOL 350 MG/ML SOLN COMPARISON:  CTA head yesterday. Cervical spine CT yesterday. FINDINGS: Skeleton: TMJ and cervical spine degeneration as described yesterday. No acute osseous abnormality identified. Upper chest: Left chest cardiac pacemaker device. Apical lung scarring. No superior mediastinal lymphadenopathy. Other neck: No acute findings. Aortic arch: 3 vessel arch configuration with mild arch atherosclerosis. Right carotid system: No brachiocephalic artery or right CCA origin plaque or stenosis. Tortuous proximal right CCA. Calcified plaque at the posterior right ICA origin without stenosis. Left carotid system: Negative aside from tortuosity. Vertebral arteries: Proximal right subclavian artery calcified plaque without significant stenosis. Normal right vertebral artery origin on series 9, image 141. Tortuous right V1 segment. Patent right vertebral artery to the skull base without plaque or stenosis. Proximal left subclavian artery soft more so than calcified plaque without significant stenosis. Normal left vertebral artery origin (same image). Tortuous left V1 segment. Patent left vertebral artery to the skull base without plaque or stenosis. Other findings: Visible intracranial anterior and posterior circulation appear stable to the CTA yesterday, including bilateral V4 segment calcified plaque and stenosis. Review of the MIP images confirms the above findings IMPRESSION: 1. Atherosclerosis is mild for age in the neck, with no extracranial carotid  or vertebral artery stenosis. Mild generalized arterial tortuosity. 2. Bilateral proximal subclavian artery plaque without significant stenosis. 3. Cervical spine and TMJ degeneration. Electronically Signed   By: Genevie Ann M.D.   On: 08/22/2019 23:18   CT Cervical Spine Wo Contrast  Result Date: 08/21/2019 CLINICAL DATA:  [place]. Hit head. EXAM: CT HEAD WITHOUT CONTRAST CT MAXILLOFACIAL WITHOUT CONTRAST CT CERVICAL SPINE WITHOUT CONTRAST TECHNIQUE: Multidetector CT imaging of the head, cervical spine, and maxillofacial structures were performed using the standard protocol without intravenous contrast. Multiplanar CT image reconstructions of the cervical spine and maxillofacial structures were also generated. COMPARISON:  None. FINDINGS: CT HEAD FINDINGS Brain: The ventricles are in the midline without mass effect or shift. They are normal in size and configuration for age. The gray-white differentiation is maintained. No extra-axial fluid collections are identified. No mass lesions. No findings for hemispheric infarction or intracranial hemorrhage. Brainstem and cerebellum appear normal. Vascular: Vascular calcifications but no aneurysm or hyperdense vessels. Skull: No skull fracture or bone lesions. Other: No scalp lesions or scalp hematoma. CT MAXILLOFACIAL FINDINGS Osseous: No acute facial bone fractures are identified. The mandibular condyles are normally located. Significant bilateral TMJ degenerative changes. No mandible fracture is identified. Orbits: No orbital fractures. The globes are intact. Sinuses: The paranasal sinuses and mastoid air cells are clear. Soft tissues: No obvious laceration, contusion or hematoma. CT CERVICAL SPINE FINDINGS Alignment: Degenerative posterior subluxation of C5 compared to C4 and C6. This is due to significant facet disease. Skull base and vertebrae: No acute fracture. No primary bone lesion or focal pathologic process. Soft tissues and spinal canal: No prevertebral fluid or  swelling. No visible canal hematoma.  Disc levels: The spinal canal is quite generous. No significant spinal stenosis. No large disc protrusions are identified. Upper chest: The lung apices are grossly clear. Biapical pleural and parenchymal scarring changes are noted. Other: Age advanced vertebral artery calcifications. Surgical changes with some type of radiopaque material in the region of the true cords bilaterally. IMPRESSION: 1. No acute intracranial findings or skull fracture. 2. No acute facial bone fractures are identified. 3. 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 4. No acute cervical spine fracture. 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 Electronically Signed   By: Marijo Sanes M.D.   On: 08/21/2019 12:23   EEG adult  Result Date: 08/21/2019 Brandy Goodell, MD     08/21/2019  9:34 PM ELECTROENCEPHALOGRAM REPORT Patient: Brandy [name]       Room #: [id number] EEG No. ID: [id number] Age: 64 y.o.        Sex: female Requesting Physician: Heber Golf Report Date:  08/21/2019       Interpreting Physician: Brandy [name] History: Brandy [name] is an 64 y.o. female with altered mental status Medications: Prozac, Toprol Conditions of Recording:  This is a 21 channel routine scalp EEG performed with bipolar and monopolar montages arranged in accordance to the international 10/20 system of electrode placement. One channel was dedicated to EKG recording. The patient is in the awake and drowsy states. Description:  The waking background activity consists of a low voltage, symmetrical, fairly well organized, 8-9 Hz alpha activity, seen from the parieto-occipital and posterior temporal regions.  Low voltage fast activity, poorly organized, is seen anteriorly and is at times superimposed on more posterior regions.  A mixture of theta and alpha rhythms are seen from the central and temporal regions. The patient drowses with slowing to irregular,  low voltage theta and beta activity.  Stage II sleep is not obtained. No epileptiform activity is noted.  Hyperventilation and intermittent photic stimulation were not performed. IMPRESSION: Normal electroencephalogram, awake and drowsy. There are no focal lateralizing or epileptiform features. Brandy Goodell, MD Neurology [phone] 08/21/2019, 9:32 PM   ECHOCARDIOGRAM COMPLETE  Result Date: 08/23/2019    ECHOCARDIOGRAM REPORT   Patient Name:   Brandy [name] Date of Exam: 08/22/2019 Medical Rec #:  [id number]        Height:       67.0 in Accession #:    [id number]       Weight:       142.0 lb Date of Birth:  1955-10-15        BSA:          1.748 m Patient Age:    52 years         BP:           79/64 mmHg Patient Gender: F                HR:           75 bpm. Exam Location:  Inpatient Procedure: 2D Echo, Cardiac Doppler and Color Doppler Indications:    Syncope, TIA  History:        Patient has prior history of Echocardiogram examinations, most                 recent 07/31/2017. Pacemaker, Arrythmias:Complete heart block and                 Atrial Fibrillation; Signs/Symptoms:Syncope and Altered Mental  Status.  Sonographer:    Dustin Flock Referring Phys: [phone] [place]  1. Left ventricular ejection fraction, by estimation, is 50 to 55%. The left ventricle has low normal function. The left ventricle has no regional wall motion abnormalities. There is mild left ventricular hypertrophy. Left ventricular diastolic parameters are indeterminate.  2. Right ventricular systolic function is normal. The right ventricular size is normal. There is normal pulmonary artery systolic pressure. The estimated right ventricular systolic pressure is 123XX123 mmHg.  3. The mitral valve is normal in structure. No evidence of mitral valve regurgitation. No evidence of mitral stenosis.  4. The aortic valve is normal in structure. Aortic valve regurgitation is not visualized. No aortic  stenosis is present.  5. The inferior vena cava is normal in size with greater than 50% respiratory variability, suggesting right atrial pressure of 3 mmHg. Conclusion(s)/Recommendation(s): No intracardiac source of embolism detected on this transthoracic study. A transesophageal echocardiogram is recommended to exclude cardiac source of embolism if clinically indicated. FINDINGS  Left Ventricle: Left ventricular ejection fraction, by estimation, is 50 to 55%. The left ventricle has low normal function. The left ventricle has no regional wall motion abnormalities. The left ventricular internal cavity size was normal in size. There is mild left ventricular hypertrophy. Abnormal (paradoxical) septal motion, consistent with left bundle branch block. Left ventricular diastolic parameters are indeterminate. Right Ventricle: The right ventricular size is normal. No increase in right ventricular wall thickness. Right ventricular systolic function is normal. There is normal pulmonary artery systolic pressure. The tricuspid regurgitant velocity is 2.69 m/s, and  with an assumed right atrial pressure of 3 mmHg, the estimated right ventricular systolic pressure is 123XX123 mmHg. Left Atrium: Left atrial size was normal in size. Right Atrium: Right atrial size was normal in size. Pericardium: There is no evidence of pericardial effusion. Mitral Valve: The mitral valve is normal in structure. Normal mobility of the mitral valve leaflets. No evidence of mitral valve regurgitation. No evidence of mitral valve stenosis. Tricuspid Valve: The tricuspid valve is normal in structure. Tricuspid valve regurgitation is mild . No evidence of tricuspid stenosis. Aortic Valve: The aortic valve is normal in structure. Aortic valve regurgitation is not visualized. No aortic stenosis is present. Pulmonic Valve: The pulmonic valve was normal in structure. Pulmonic valve regurgitation is not visualized. No evidence of pulmonic stenosis. Aorta: The  aortic root is normal in size and structure. Venous: The inferior vena cava is normal in size with greater than 50% respiratory variability, suggesting right atrial pressure of 3 mmHg. IAS/Shunts: No atrial level shunt detected by color flow Doppler. Additional Comments: A pacer wire is visualized in the right atrium and right ventricle.  LEFT VENTRICLE PLAX 2D LVIDd:         4.00 cm  Diastology LVIDs:         2.70 cm  LV e' lateral:   7.10 cm/s LV PW:         1.00 cm  LV E/e' lateral: 8.1 LV IVS:        1.00 cm  LV e' medial:    5.52 cm/s LVOT diam:     1.70 cm  LV E/e' medial:  10.5 LV SV:         31 LV SV Index:   18 LVOT Area:     2.27 cm  RIGHT VENTRICLE RV Basal diam:  3.00 cm RV S prime:     14.40 cm/s TAPSE (M-mode): 2.2 cm LEFT ATRIUM  Index       RIGHT ATRIUM          Index LA diam:        3.10 cm 1.77 cm/m  RA Area:     9.65 cm LA Vol (A2C):   27.6 ml 15.79 ml/m RA Volume:   19.00 ml 10.87 ml/m LA Vol (A4C):   34.4 ml 19.68 ml/m LA Biplane Vol: 31.5 ml 18.02 ml/m  AORTIC VALVE LVOT Vmax:   67.90 cm/s LVOT Vmean:  43.700 cm/s LVOT VTI:    0.137 m  AORTA Ao Root diam: 2.90 cm MITRAL VALVE               TRICUSPID VALVE MV Area (PHT): 4.49 cm    TR Peak grad:   28.9 mmHg MV Decel Time: 169 msec    TR Vmax:        269.00 cm/s MV E velocity: 57.80 cm/s MV A velocity: 53.60 cm/s  SHUNTS MV E/A ratio:  1.08        Systemic VTI:  0.14 m                            Systemic Diam: 1.70 cm Brandy Kaiser MD Electronically signed by Brandy Kaiser MD Signature Date/Time: 08/23/2019/12:22:27 AM    Final    CT Maxillofacial WO CM  Result Date: 08/21/2019 CLINICAL DATA:  [place]. Hit head. EXAM: CT HEAD WITHOUT CONTRAST CT MAXILLOFACIAL WITHOUT CONTRAST CT CERVICAL SPINE WITHOUT CONTRAST TECHNIQUE: Multidetector CT imaging of the head, cervical spine, and maxillofacial structures were performed using the standard protocol without intravenous contrast. Multiplanar CT image reconstructions of the  cervical spine and maxillofacial structures were also generated. COMPARISON:  None. FINDINGS: CT HEAD FINDINGS Brain: The ventricles are in the midline without mass effect or shift. They are normal in size and configuration for age. The gray-white differentiation is maintained. No extra-axial fluid collections are identified. No mass lesions. No findings for hemispheric infarction or intracranial hemorrhage. Brainstem and cerebellum appear normal. Vascular: Vascular calcifications but no aneurysm or hyperdense vessels. Skull: No skull fracture or bone lesions. Other: No scalp lesions or scalp hematoma. CT MAXILLOFACIAL FINDINGS Osseous: No acute facial bone fractures are identified. The mandibular condyles are normally located. Significant bilateral TMJ degenerative changes. No mandible fracture is identified. Orbits: No orbital fractures. The globes are intact. Sinuses: The paranasal sinuses and mastoid air cells are clear. Soft tissues: No obvious laceration, contusion or hematoma. CT CERVICAL SPINE FINDINGS Alignment: Degenerative posterior subluxation of C5 compared to C4 and C6. This is due to significant facet disease. Skull base and vertebrae: No acute fracture. No primary bone lesion or focal pathologic process. Soft tissues and spinal canal: No prevertebral fluid or swelling. No visible canal hematoma. Disc levels: The spinal canal is quite generous. No significant spinal stenosis. No large disc protrusions are identified. Upper chest: The lung apices are grossly clear. Biapical pleural and parenchymal scarring changes are noted. Other: Age advanced vertebral artery calcifications. Surgical changes with some type of radiopaque material in the region of the true cords bilaterally. IMPRESSION: 1. No acute intracranial findings or skull fracture. 2. No acute facial bone fractures are identified. 3. 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 4. No acute cervical spine fracture. Age advanced  degenerative cervical spondylosis with multilevel disc disease and facet disease. Electronically Signed   By: Marijo Sanes M.D.   On: 08/21/2019 12:23     Assessment and  Plan:   Episode of unresponsiveness:   The etiology of this is unclear.  I will have the device interrogated but I doubt that this is device failure and it is unlikely to be related to the atrial fibrillation in the absence of a significant stroke.  She does have some orthostatic hypotension but no symptoms related to this.  She does need to be aware of this and we talked about this.  Otherwise likely etiology is a more likely neurologic with TIA or transient global amnesia.  She does have follow-up already in our cardiology office and she will keep this appointment.  Paroxysmal atrial fibrillation: Her device will be interrogated as below.  She has been AV paced on telemetry.  She is currently on anticoagulation newly initiated.  Permanent pacemaker:  We will have this interrogated.       For questions or updates, please contact [place] Please consult [email or web address] for contact info under     Signed, Minus Breeding, MD  08/23/2019 12:10 PM

## 2019-09-02 DIAGNOSIS — N958 Other specified menopausal and perimenopausal disorders: Secondary | ICD-10-CM | POA: Diagnosis not present

## 2019-09-02 DIAGNOSIS — Z124 Encounter for screening for malignant neoplasm of cervix: Secondary | ICD-10-CM | POA: Diagnosis not present

## 2019-09-02 DIAGNOSIS — J45909 Unspecified asthma, uncomplicated: Secondary | ICD-10-CM | POA: Insufficient documentation

## 2019-09-02 DIAGNOSIS — Z6823 Body mass index (BMI) 23.0-23.9, adult: Secondary | ICD-10-CM | POA: Diagnosis not present

## 2019-09-02 DIAGNOSIS — R87619 Unspecified abnormal cytological findings in specimens from cervix uteri: Secondary | ICD-10-CM | POA: Insufficient documentation

## 2019-09-02 DIAGNOSIS — M8588 Other specified disorders of bone density and structure, other site: Secondary | ICD-10-CM | POA: Diagnosis not present

## 2019-09-02 DIAGNOSIS — G43909 Migraine, unspecified, not intractable, without status migrainosus: Secondary | ICD-10-CM | POA: Insufficient documentation

## 2019-09-02 DIAGNOSIS — M79673 Pain in unspecified foot: Secondary | ICD-10-CM | POA: Insufficient documentation

## 2019-09-02 DIAGNOSIS — F32A Depression, unspecified: Secondary | ICD-10-CM | POA: Insufficient documentation

## 2019-09-02 DIAGNOSIS — Z1231 Encounter for screening mammogram for malignant neoplasm of breast: Secondary | ICD-10-CM | POA: Diagnosis not present

## 2019-09-02 DIAGNOSIS — Z01419 Encounter for gynecological examination (general) (routine) without abnormal findings: Secondary | ICD-10-CM | POA: Diagnosis not present

## 2019-09-04 ENCOUNTER — Telehealth: Payer: Self-pay | Admitting: Cardiovascular Disease

## 2019-09-04 DIAGNOSIS — F4323 Adjustment disorder with mixed anxiety and depressed mood: Secondary | ICD-10-CM | POA: Diagnosis not present

## 2019-09-04 NOTE — Telephone Encounter (Signed)
Patient called and wanted to know if her Daughter would be able to be present during her upcoming appointment. The patient had a spell where she fell and does not remember a block of time. Please let the patient know what the office decides

## 2019-09-04 NOTE — Telephone Encounter (Signed)
Returned call to patient, ok for daughter to come to appt.

## 2019-09-07 NOTE — Progress Notes (Signed)
Established Patient Office Visit  Subjective:  Patient ID: Brandy [name], female    DOB: [birth year]  Age: 64 y.o. MRN: [id number]  CC:  Chief Complaint  Patient presents with  . Hospitalization Follow-up    HPI Brandy [name] presents for hospital follow-up. Reports she is doing better. States she has follow-up with cardiology tomorrow and with neurology on 09/10/19. She is unable to bend her left 4th toe and thinks she might have fractured it with her fall. Reports minimal pain and can bear weight. States since her fall and hospitalization she feels tired, especially after activities but that seems to be doing better. Denies dyspnea, chest pain, palpitations, or syncope. Due to her fall she needs to do a root canal on one of her front tooth. She has maxillary and mandible tenderness that is improving. She hasn't been driving as instructed. Denies recent memory loss or issues or new falls.   Discharge summary: Hospital Course by problem list: 1. Transient global amnesia: This is a 64 year old female with a history of Paroxysmal a fib, complete heart block s/p pacemaker, and anxiety who presented after a fall with amnesia. She was brought in from home, does not know what happened and persistently forgot the fall episode even after being told about it. No other focal neurological deficits noted. CT head showed no acute abnormalities. CTA cervical spine no acute findings. Unable to obtain MRI due to pacemaker placement so obtained repeat CT head and CTAs the following day. Had improvement in her amnesia the following day. Repeat CTA showed bilateral proximal subclavian artery plaques, no significant stenosis, cervical spine and TMJ degeneration. Echocardiogram showed EF 50-55%, no WMA, mild LVH, otherwise unremarkable. She was noted to be significantly orthostatic which was concerning since this could be the cause of her fall. Cardiology consulted and interrogated pacemaker. Pacemaker showed no  abnormalities, felt to be move concerning for neurologic issue. Advised that she was stable for discharge with cardiology follow up. Patient discharged with cardiology follow up and will need to schedule neurology follow up.   2. Paroxysmal atrial fibrillation A fib on admission, asymptomatic from this. CHADSVASC 4. There was discussion previously with cardiology about starting on anticoagulation. Given the unclear cause of her fall and amnesia, discussed risks and beenfits of starting anticoagulation. Started on [place], her ASA was held on discharge. She was in NSR on discharge.   3. Anxiety/Depression: Continued home prozac and xanax as needed.   08/21/19 CT Cervical spine, maxillofacial: IMPRESSION: 1. No acute intracranial findings or skull fracture. 2. No acute facial bone fractures are identified. 3. 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 4. No acute cervical spine fracture. 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   08/21/19 CT head: IMPRESSION: 1. No acute intracranial findings or skull fracture. 2. No acute facial bone fractures are identified. 3. 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 4. No acute cervical spine fracture. 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  08/21/19 CT head: IMPRESSION: 1. Negative for large vessel occlusion. No intracranial aneurysm identified. 2. Positive for distal vertebral artery calcified plaque with up to Severe Right and Moderate Left V4 segment stenoses. 3. No significant anterior circulation stenosis. 4. Stable CT appearance of the brain since 1159 hours today.   08/21/19 EEG:  IMPRESSION: Normal electroencephalogram, awake anddrowsy. There are no focal lateralizing or epileptiform features.   08/22/19 CTA Head and neck: IMPRESSION: 1. Atherosclerosis is  mild for age in the  neck, with no extracranial carotid or vertebral artery stenosis. Mild generalized arterial tortuosity.  2. Bilateral proximal subclavian artery plaque without significant stenosis. 3. Cervical spine and TMJ degeneration.  08/22/19 CT head: IMPRESSION: 1. Continued stable non contrast CT appearance of the brain. 2. Mild for age cerebral white matter changes.  08/22/19 Echocardiogram: IMPRESSIONS    1. Left ventricular ejection fraction, by estimation, is 50 to 55%. The  left ventricle has low normal function. The left ventricle has no regional  wall motion abnormalities. There is mild left ventricular hypertrophy.  Left ventricular diastolic  parameters are indeterminate.  2. Right ventricular systolic function is normal. The right ventricular  size is normal. There is normal pulmonary artery systolic pressure. The  estimated right ventricular systolic pressure is 54.6 mmHg.  3. The mitral valve is normal in structure. No evidence of mitral valve  regurgitation. No evidence of mitral stenosis.  4. The aortic valve is normal in structure. Aortic valve regurgitation is  not visualized. No aortic stenosis is present.  5. The inferior vena cava is normal in size with greater than 50%  respiratory variability, suggesting right atrial pressure of 3 mmHg.   Conclusion(s)/Recommendation(s): No intracardiac source of embolism  detected on this transthoracic study. A transesophageal echocardiogram is  recommended to exclude cardiac source of embolism if clinically indicated.   Past Medical History:  Diagnosis Date  . Allergic rhinitis   . Asthmatic bronchitis   . Complete heart block ([place])   . GERD (gastroesophageal reflux disease)   . MRSA (methicillin resistant Staphylococcus aureus) 2013   left buttock  . Nonsustained ventricular tachycardia ([place])   . 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 Vaginitis   . Vocal cord paresis     Past  Surgical History:  Procedure Laterality Date  . ACROMIO-CLAVICULAR JOINT REPAIR Left 12/10/2014   Procedure: LEFT CORACOCLAVICULAR RECONSTRUCTION WITH  ZIPLOOP;  Surgeon: Tania Ade, MD;  Location: [facility];  Service: Orthopedics;  Laterality: Left;  . APPENDECTOMY  05-2010  . DILATION AND CURETTAGE OF UTERUS  1978  . HAND SURGERY    . PACEMAKER INSERTION  08/05/08   Medtronic Adapta  . Vocal cord splint  2005    Family History  Problem Relation Age of Onset  . Hypertension Mother   . Coronary artery disease Mother   . Depression Mother   . Heart disease Mother   . Diabetes Father   . Heart disease Father   . Depression Father   . Hypertension Father   . Hypertension Sister   . Alcohol abuse Sister   . Depression Sister   . Diabetes Paternal Uncle   . Cancer Maternal Grandmother         colon  . Heart disease Maternal Grandfather   . Diabetes Maternal Grandfather   . Alcohol abuse Paternal Grandfather   . Heart disease Paternal Grandfather     Social History   Socioeconomic History  . Marital status: Widowed    Spouse name: Not on file  . Number of children: Not on file  . Years of education: Not on file  . Highest education level: Not on file  Occupational History  . Not on file  Tobacco Use  . Smoking status: Never Smoker  . Smokeless tobacco: Never Used  Substance and Sexual Activity  . Alcohol use: Not Currently  . Drug use: Never  . Sexual activity: Not Currently  Birth control/protection: None  Other Topics Concern  . Not on file  Social History Narrative  . Not on file   Social Determinants of Health   Financial Resource Strain:   . Difficulty of Paying Living Expenses:   Food Insecurity:   . Worried About Charity fundraiser in the Last Year:   . Arboriculturist in the Last Year:   Transportation Needs:   . Film/video editor (Medical):   Marland Kitchen Lack of Transportation (Non-Medical):   Physical Activity:   . Days of Exercise  per Week:   . Minutes of Exercise per Session:   Stress:   . Feeling of Stress :   Social Connections:   . Frequency of Communication with Friends and Family:   . Frequency of Social Gatherings with Friends and Family:   . Attends Religious Services:   . Active Member of Clubs or Organizations:   . Attends Archivist Meetings:   Marland Kitchen Marital Status:   Intimate Partner Violence:   . Fear of Current or Ex-Partner:   . Emotionally Abused:   Marland Kitchen Physically Abused:   . Sexually Abused:     Outpatient Medications Prior to Visit  Medication Sig Dispense Refill  . ALPRAZolam (XANAX) 0.25 MG tablet Take 0.25 mg by mouth at bedtime as needed for sleep.     Marland Kitchen atorvastatin (LIPITOR) 20 MG tablet Take 1 tablet (20 mg total) by mouth daily. 30 tablet 0  . Calcium Carbonate-Vitamin D (CALCIUM 500 + D) 500-125 MG-UNIT TABS Take 1 tablet by mouth daily.      Marland Kitchen FLUoxetine (PROZAC) 20 MG capsule Take 20 mg by mouth daily.     . fluticasone (FLONASE) 50 MCG/ACT nasal spray USE 1 SPRAY IN EACH NOSTRIL DAILY (Patient taking differently: Place 1 spray into both nostrils daily. USE 1 SPRAY IN EACH NOSTRIL DAILY) 16 g 5  . metoprolol succinate (TOPROL-XL) 50 MG 24 hr tablet Take 1 tablet (50 mg total) by mouth daily. Take with or immediately following a meal. 90 tablet 3  . rivaroxaban (XARELTO) 20 MG TABS tablet Take 1 tablet (20 mg total) by mouth daily with supper. 30 tablet 0  . valACYclovir (VALTREX) 1000 MG tablet Take 1 tablet by mouth daily as needed (cold sores).      No facility-administered medications prior to visit.    Allergies  Allergen Reactions  . Erythromycin   . Sulfa Antibiotics Rash    Urticarial rash developed 1 day after completing a 14 day course of TMP/SMX    ROS Review of Systems  A fourteen system review of systems was performed and found to be positive as per HPI.   Objective:    Physical Exam General:  Well Developed, well nourished, appropriate for stated age.   Neuro:  Alert and oriented,  extra-ocular muscles intact  HEENT:  Normocephalic, atraumatic, neck supple, no carotid bruits appreciated  Skin:  no gross rash, warm, pink. Cardiac:  RRR, S1 S2, no mumur Respiratory:  ECTA B/L and A/P, Not using accessory muscles, speaking in full sentences- unlabored. MSK: Normal gait. Limited plantarflexion of left fourth toe. Minimal tenderness to palpation of fourth toe without edema. No obvious deformity noted. Good strength, sensation intact B/L. Good pedal pulses.   Vascular:  Ext warm, no cyanosis apprec.; cap RF less 2 sec. Psych:  No HI/SI, judgement and insight good, Euthymic mood. Full Affect.   BP 126/71   Pulse 79   Temp 98.1 F (36.7  C) (Oral)   Ht 5\' 7"  (1.702 m)   Wt 146 lb (66.2 kg)   SpO2 100%   BMI 22.87 kg/m  Wt Readings from Last 3 Encounters:  09/08/19 146 lb (66.2 kg)  08/21/19 142 lb (64.4 kg)  12/02/18 147 lb 4.8 oz (66.8 kg)     Health Maintenance Due  Topic Date Due  . Hepatitis C Screening  Never done  . COVID-19 Vaccine (1) Never done  . PNA vac Low Risk Adult (1 of 2 - PCV13) 12/15/2018  . COLONOSCOPY  07/14/2019  . MAMMOGRAM  08/29/2019    There are no preventive care reminders to display for this patient.  Lab Results  Component Value Date   TSH 2.370 08/21/2019   Lab Results  Component Value Date   WBC 6.0 08/22/2019   HGB 12.9 08/22/2019   HCT 39.1 08/22/2019   MCV 98.7 08/22/2019   PLT 209 08/22/2019   Lab Results  Component Value Date   NA 140 08/22/2019   K 3.7 08/22/2019   CO2 25 08/22/2019   GLUCOSE 97 08/22/2019   BUN 19 08/22/2019   CREATININE 0.92 08/22/2019   BILITOT 0.6 08/22/2019   ALKPHOS 66 08/22/2019   AST 17 08/22/2019   ALT 14 08/22/2019   PROT 5.8 (L) 08/22/2019   ALBUMIN 3.3 (L) 08/22/2019   CALCIUM 8.8 (L) 08/22/2019   ANIONGAP 8 08/22/2019   Lab Results  Component Value Date   CHOL 223 (H) 08/21/2019   Lab Results  Component Value Date   HDL 71 08/21/2019    Lab Results  Component Value Date   LDLCALC 141 (H) 08/21/2019   Lab Results  Component Value Date   TRIG 55 08/21/2019   Lab Results  Component Value Date   CHOLHDL 3.1 08/21/2019   Lab Results  Component Value Date   HGBA1C 5.9 (H) 03/28/2018      Assessment & Plan:   Problem List Items Addressed This Visit      Cardiovascular and Mediastinum   Paroxysmal atrial fibrillation ([place])     Other   Anxiety disorder   Depression, major, recurrent, moderate ([place])   Global amnesia    Other Visit Diagnoses    Hospital discharge follow-up    -  Primary   Toe pain, left         Hospital discharge, Global amnesia, Paroxysmal atrial fibrillation: - Follow-up with cardiology and neurology as scheduled. - Continue to avoid driving as directed for 6 months. - Continue current medication regimen. - Reassurance provided facial bruising healing well.  Left toe pain: - Pt declined foot x-ray today. Advised to monitor symptoms and if worsening or fails to improve then RTC for further evaluation and would highly advise to have foot x-ray done.  - Discussed with patient possible complications of toe fractures including non-healing. Pt verbalized understanding and will monitor symptoms.   Mood, Depression, Anxiety: - PHQ9 score of 8 and GAD-7 score of 3. - Pt reports mood is stable and due to recent hospitalization has been experiencing increased stress but is able to manage. - Continue BH therapy sessions. - Continue Prozac and will continue to monitor.   No orders of the defined types were placed in this encounter.   Follow-up: Return in about 4 months (around 01/08/2020) for [place].    Lorrene Reid, PA-C

## 2019-09-08 ENCOUNTER — Ambulatory Visit (INDEPENDENT_AMBULATORY_CARE_PROVIDER_SITE_OTHER): Payer: PPO | Admitting: Physician Assistant

## 2019-09-08 ENCOUNTER — Other Ambulatory Visit: Payer: Self-pay

## 2019-09-08 ENCOUNTER — Encounter: Payer: Self-pay | Admitting: Physician Assistant

## 2019-09-08 VITALS — BP 126/71 | HR 79 | Temp 98.1°F | Ht 67.0 in | Wt 146.0 lb

## 2019-09-08 DIAGNOSIS — M79675 Pain in left toe(s): Secondary | ICD-10-CM

## 2019-09-08 DIAGNOSIS — Z09 Encounter for follow-up examination after completed treatment for conditions other than malignant neoplasm: Secondary | ICD-10-CM | POA: Diagnosis not present

## 2019-09-08 DIAGNOSIS — F331 Major depressive disorder, recurrent, moderate: Secondary | ICD-10-CM | POA: Diagnosis not present

## 2019-09-08 DIAGNOSIS — R413 Other amnesia: Secondary | ICD-10-CM

## 2019-09-08 DIAGNOSIS — I480 Paroxysmal atrial fibrillation: Secondary | ICD-10-CM | POA: Diagnosis not present

## 2019-09-08 DIAGNOSIS — F419 Anxiety disorder, unspecified: Secondary | ICD-10-CM | POA: Diagnosis not present

## 2019-09-08 NOTE — Patient Instructions (Signed)
Transient Global Amnesia Transient global amnesia causes a sudden and temporary (transient) loss of memory (amnesia). You may recall memories from your distant past and people you know well. However, you may not recall things that happened more recently in the past days, months, or even year. A transient global amnesia episode does not last longer than 24 hours. Transient global amnesia does not affect your other brain functions. Your memory usually returns to normal after an episode is over. One episode of transient global amnesia does not make you more likely to have a stroke, a relapse, or other complications. What are the causes? The cause of this condition is not known. Certain activities have been reported to trigger transient global amnesia. These activities include:  Swimming in very cold or hot water.  Sexual intercourse.  Emotional distress, such as receiving bad news or having a lot of stress at once.  Strenuous exercise or activity. What increases the risk? You are more likely to develop this condition if:  You are 69-33 years old.  You have a history of migraine headaches. What are the signs or symptoms? The main symptoms of this condition include:  Being unable to remember recent events.  Asking repetitive questions about a situation and surroundings and not recalling the answers to these questions. Other symptoms include:  Restlessness and nervousness.  Confusion.  Headaches.  Dizziness.  Nausea. How is this diagnosed? This condition may be diagnosed based on:  Your symptoms.  A physical exam.  A test to check your mental abilities (cognitive evaluation).  Imaging studies to check brain function. These may include: ? Electroencephalogram (EEG). This test checks the brain's electrical activity. ? CT scan. ? MRI. How is this treated? There is no treatment for this condition. An episode typically goes away on its own after a few hours. You may receive  medicines to treat other conditions, such as a migraine. Follow these instructions at home:  Take over-the-counter and prescription medicines only as told by your health care provider.  Avoid taking medicines that can affect thinking, such as pain or sleeping medicines.  Learn what activities may trigger an episode. Avoid these activities as told by your health care provider.  Find ways to manage stress, such as meditation or yoga.  Keep all follow-up visits as told by your health care provider. This is important. Contact a health care provider if you:  Have a migraine that does not go away.  Experience transient global amnesia repeatedly. Get help right away if you:  Have a seizure. Summary  Transient global amnesia causes a sudden and temporary (transient) loss of memory (amnesia).  There is no treatment for this condition. An episode typically goes away on its own after a few hours.  You may receive medicines to treat other conditions, such as a migraine.  Transient global amnesia does not affect your other brain functions. Your memory usually returns to normal after an episode is over. This information is not intended to replace advice given to you by your health care provider. Make sure you discuss any questions you have with your health care provider. Document Revised: 04/04/2017 Document Reviewed: 04/04/2017 Elsevier Patient Education  2020 [employer].

## 2019-09-09 ENCOUNTER — Ambulatory Visit (INDEPENDENT_AMBULATORY_CARE_PROVIDER_SITE_OTHER): Payer: PPO | Admitting: Cardiovascular Disease

## 2019-09-09 ENCOUNTER — Encounter: Payer: Self-pay | Admitting: Cardiovascular Disease

## 2019-09-09 VITALS — BP 125/83 | HR 70 | Temp 96.6°F | Ht 67.0 in | Wt 145.2 lb

## 2019-09-09 DIAGNOSIS — I472 Ventricular tachycardia: Secondary | ICD-10-CM | POA: Diagnosis not present

## 2019-09-09 DIAGNOSIS — I480 Paroxysmal atrial fibrillation: Secondary | ICD-10-CM | POA: Diagnosis not present

## 2019-09-09 DIAGNOSIS — R55 Syncope and collapse: Secondary | ICD-10-CM | POA: Diagnosis not present

## 2019-09-09 DIAGNOSIS — I4729 Other ventricular tachycardia: Secondary | ICD-10-CM

## 2019-09-09 DIAGNOSIS — I442 Atrioventricular block, complete: Secondary | ICD-10-CM | POA: Diagnosis not present

## 2019-09-09 DIAGNOSIS — Z950 Presence of cardiac pacemaker: Secondary | ICD-10-CM | POA: Diagnosis not present

## 2019-09-09 NOTE — Progress Notes (Signed)
Patient ID: Brandy [name], female   DOB: 1956-02-05, 64 y.o.   MRN: [id number]     Cardiology Office Note    Date:  09/09/2019   ID:  Brandy [name], DOB Jan 10, 1956, MRN [id number]  PCP:  Lorrene Reid, PA-C  Cardiologist:   Sanda Klein, MD   No chief complaint on file.   History of Present Illness:  Brandy [name] is a 64 y.o. female with complete heart block who presents for pacemaker follow-up. She also has episodes of asymptomatic ventricular tachycardia (nonsustained) and paroxysmal atrial fibrillation that have been detected by her device.  Brandy [name] on May 20.  She was hanging out close.  She has no recollection of losing consciousness but apparently fell face first and injured herself.  Unclear how long she lost consciousness, but when she came to she went into the house and called her daughter.  When her daughter arrived, Brandy [name] was oriented to herself and to location but thought that "Brandy [name]" was president.  They subsequently went to the emergency room where she had worsening confusion over a period of several hours.  Apparently there was evidence of a small bites to the posterior part of her tongue, but she did not have sphincter tone loss.  She was asking the same question repeatedly and immediately forget what the circumstances were.  She apparently did not fully recover to normal until the next day.  She still has amnesia for almost all of the events of that day.  The next day she had documented orthostatic hypotension with her systolic blood pressure dropping from 150 down to 70.  MRI was not performed due to the presence of the pacemaker, but she had 2 consecutive CTs that did not show any evidence of stroke or intracranial hemorrhage.  This presumed diagnosis was transient global amnesia versus a transient ischemic attack.  Seizure was felt to be unlikely and EEG was unremarkable.  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  Aspirin was not started at this point, since she was newly on oral anticoagulation.  No driving was recommended for 6 months.  She has a follow-up scheduled with Dr. Asencion Partridge Dohmeier tomorrow.  She was evaluated by cardiology (Dr. Percival Spanish) on May 22.  Her pacemaker had not been interrogated at that point, but from a clinical point of view the impression was that this was not arrhythmic syncope.  The pacemaker was interrogated later that same day and presumably there were normal findings, but unfortunately I cannot find an actual record of the interrogation in the electronic record.  Probably has not yet been scanned in.  All the ECG and monitor strips available from that admission show AV sequential pacing.  On her last remote download performed in April 2021 there had been no atrial fibrillation seen since October 2020.  Her echocardiogram was normal.  The EF is 50-55%, slightly low likely due to RV pacing.  No valvular abnormalities were seen.  She has not had any cardiovascular complaints since. The patient specifically denies any chest pain at rest exertion, dyspnea at rest or with exertion, orthopnea, paroxysmal nocturnal dyspnea, new episodes of syncope, palpitations, focal neurological deficits, intermittent claudication, lower extremity edema, unexplained weight gain, cough, hemoptysis or wheezing.  She needs to have a root canal performed in the near future.  Pacemaker interrogation shows normal device function. Current device was implanted in 2010 and has an expected generator  longevity of 1.5 years.  Lead parameters are all normal, there are no detectable R waves.  She has 99.7 % ventricular pacing but also has 97 % atrial pacing.  She is pacemaker dependent, there is no underlying escape rhythm.  On June 3 she had one 14 beat (5 seconds) episode of nonsustained ventricular tachycardia at 150 bpm.  This was asymptomatic.  There has been no atrial fibrillation  since the counters were cleared on Aug 23, 2019.    Past Medical History:  Diagnosis Date   Allergic rhinitis    Asthmatic bronchitis    Complete heart block (HCC)    GERD (gastroesophageal reflux disease)    MRSA (methicillin resistant Staphylococcus aureus) 2013   left buttock   Nonsustained ventricular tachycardia (HCC)    Presence of permanent cardiac pacemaker 08-05-08   Medtronic Adapta   Vaginitis    Vocal cord paresis     Past Surgical History:  Procedure Laterality Date   ACROMIO-CLAVICULAR JOINT REPAIR Left 12/10/2014   Procedure: LEFT CORACOCLAVICULAR RECONSTRUCTION WITH  ZIPLOOP;  Surgeon: Tania Ade, MD;  Location: [facility];  Service: Orthopedics;  Laterality: Left;   APPENDECTOMY  05-2010   DILATION AND CURETTAGE OF UTERUS  1978   HAND SURGERY     PACEMAKER INSERTION  08/05/08   Medtronic Adapta   Vocal cord splint  2005    Outpatient Medications Prior to Visit  Medication Sig Dispense Refill   ALPRAZolam (XANAX) 0.25 MG tablet Take 0.25 mg by mouth at bedtime as needed for sleep.      atorvastatin (LIPITOR) 20 MG tablet Take 1 tablet (20 mg total) by mouth daily. 30 tablet 0   Calcium Carbonate-Vitamin D (CALCIUM 500 + D) 500-125 MG-UNIT TABS Take 1 tablet by mouth daily.       FLUoxetine (PROZAC) 20 MG capsule Take 20 mg by mouth daily.      fluticasone (FLONASE) 50 MCG/ACT nasal spray USE 1 SPRAY IN EACH NOSTRIL DAILY (Patient taking differently: Place 1 spray into both nostrils daily. USE 1 SPRAY IN EACH NOSTRIL DAILY) 16 g 5   metoprolol succinate (TOPROL-XL) 50 MG 24 hr tablet Take 1 tablet (50 mg total) by mouth daily. Take with or immediately following a meal. 90 tablet 3   rivaroxaban (XARELTO) 20 MG TABS tablet Take 1 tablet (20 mg total) by mouth daily with supper. 30 tablet 0   valACYclovir (VALTREX) 1000 MG tablet Take 1 tablet by mouth daily as needed (cold sores).      No facility-administered medications  prior to visit.     Allergies:   Erythromycin and Sulfa antibiotics   Social History   Socioeconomic History   Marital status: Widowed    Spouse name: Not on file   Number of children: Not on file   Years of education: Not on file   Highest education level: Not on file  Occupational History   Not on file  Tobacco Use   Smoking status: Never Smoker   Smokeless tobacco: Never Used  Substance and Sexual Activity   Alcohol use: Not Currently   Drug use: Never   Sexual activity: Not Currently    Birth control/protection: None  Other Topics Concern   Not on file  Social History Narrative   Not on file   Social Determinants of Health   Financial Resource Strain:    Difficulty of Paying Living Expenses:   Food Insecurity:    Worried About Running Out of Food in the  Last Year:    Arboriculturist in the Last Year:   Transportation Needs:    Film/video editor (Medical):    Lack of Transportation (Non-Medical):   Physical Activity:    Days of Exercise per Week:    Minutes of Exercise per Session:   Stress:    Feeling of Stress :   Social Connections:    Frequency of Communication with Friends and Family:    Frequency of Social Gatherings with Friends and Family:    Attends Religious Services:    Active Member of Clubs or Organizations:    Attends Music therapist:    Marital Status:      Family History:  The patient's family history includes Alcohol abuse in her paternal grandfather and sister; Cancer in her maternal grandmother; Coronary artery disease in her mother; Depression in her father, mother, and sister; Diabetes in her father, maternal grandfather, and paternal uncle; Heart disease in her father, maternal grandfather, mother, and paternal grandfather; Hypertension in her father, mother, and sister.   ROS:   Please see the history of present illness.    ROS All other systems are reviewed and are negative.   PHYSICAL  EXAM:   VS:  BP 125/83    Pulse 70    Temp (!) 96.6 F (35.9 C)    Ht 5\' 7"  (1.702 m)    Wt 145 lb 3.2 oz (65.9 kg)    SpO2 99%    BMI 22.74 kg/m      General: Alert, oriented x3, no distress, healthy subclavian pacemaker site Head: no evidence of trauma, PERRL, EOMI, no exophtalmos or lid lag, no myxedema, no xanthelasma; normal ears, nose and oropharynx Neck: normal jugular venous pulsations and no hepatojugular reflux; brisk carotid pulses without delay and no carotid bruits Chest: clear to auscultation, no signs of consolidation by percussion or palpation, normal fremitus, symmetrical and full respiratory excursions Cardiovascular: normal position and quality of the apical impulse, regular rhythm, normal first and paradoxically split second heart sounds, no murmurs, rubs or gallops Abdomen: no tenderness or distention, no masses by palpation, no abnormal pulsatility or arterial bruits, normal bowel sounds, no hepatosplenomegaly Extremities: no clubbing, cyanosis or edema; 2+ radial, ulnar and brachial pulses bilaterally; 2+ right femoral, posterior tibial and dorsalis pedis pulses; 2+ left femoral, posterior tibial and dorsalis pedis pulses; no subclavian or femoral bruits Neurological: grossly nonfocal Psych: Normal mood and affect   Wt Readings from Last 3 Encounters:  09/09/19 145 lb 3.2 oz (65.9 kg)  09/08/19 146 lb (66.2 kg)  08/21/19 142 lb (64.4 kg)      Studies/Labs Reviewed:   EKG:  EKG is ordered today.  The ekg ordered today demonstrates atrioventricular sequential pacing  Recent Labs: 08/21/2019: TSH 2.370 08/22/2019: ALT 14; BUN 19; Creatinine, Ser 0.92; Hemoglobin 12.9; Platelets 209; Potassium 3.7; Sodium 140   Lipid Panel    Component Value Date/Time   CHOL 223 (H) 08/21/2019 2100   CHOL 215 (H) 03/28/2018 0858   TRIG 55 08/21/2019 2100   HDL 71 08/21/2019 2100   HDL 61 03/28/2018 0858   CHOLHDL 3.1 08/21/2019 2100   VLDL 11 08/21/2019 2100   LDLCALC 141  (H) 08/21/2019 2100   LDLCALC 134 (H) 03/28/2018 0858      ASSESSMENT:    1. CHB (complete heart block) ([place])   2. NSVT (nonsustained ventricular tachycardia) ([place])   3. Syncope and collapse   4. Pacemaker - Medtronic dual-chamber implanted 2010  5. Paroxysmal atrial fibrillation (HCC)      PLAN:  In order of problems listed above:  1. Syncope: The sudden loss of consciousness without prodromal symptoms that would suggest arrhythmia, but multiple other features are more in keeping with a neurological [name].  She was disoriented and confused for hours after regaining consciousness.  The symptoms persisted even when she had normal hemodynamic parameters and lying in bed, so I do not think orthostatic hypotension explains the whole sequence of events either.  I know that her pacemaker was checked on May 22 and I can only assume that the findings were normal, but unfortunately I cannot review the actual pacemaker download and the counters have been reset. 2. NSVT: She has had occasional brief asymptomatic episodes of nonsustained ventricular tachycardia for many years and is taking beta-blockers.  She had an asymptomatic episode of nonsustained ventricular tachycardia on June 3.  Unfortunately, I cannot review for any rhythm abnormalities at the actual time of the syncopal [name] since the counters have been cleared. 3. CHB: Pacemaker dependent.  She has had a reliable pacemaker without syncope or adverse events for over 10 years now. 4. PPM: Normal device function.  Both the atrial and ventricular leads are Medtronic 5076 leads that are MRI conditional, but her current pacemaker generator is a Medtronic Adapta device that is not formally cleared for MRI scanning.  The patient is pacemaker dependent, so MRI scanning should not be undertaken lightly.  However, if the neurologist's opinion is that an MRI is essential in providing diagnosis and with change therapeutic approach, we will organize the  appropriate type of monitoring so that she can have limited MRI scanning. 5. AFib: Her burden of atrial fibrillation has been historically low, but she now meets criteria for full anticoagulation. CHADSVasc 2 (gender and age).  Aspirin has been recently stopped and she has been started on Xarelto.  Again, since the counters have been recently cleared I cannot say for certain she did not have atrial fibrillation around the time of her recent syncopal [name], there has been no atrial fibrillation recorded since May 22.  I think it is okay for her to temporarily interrupt the Xarelto for 48 hours before her root canal.  Medication Adjustments/Labs and Tests Ordered: Current medicines are reviewed at length with the patient today.  Concerns regarding medicines are outlined above.  Medication changes, Labs and Tests ordered today are listed in the Patient Instructions below. Patient Instructions  Medication Instructions:  NO CHANGE *If you need a refill on your cardiac medications before your next appointment, please call your pharmacy*   Lab Work: If you have labs (blood work) drawn today and your tests are completely normal, you will receive your results only by:  MyChart Message (if you have MyChart) OR  A paper copy in the mail If you have any lab test that is abnormal or we need to change your treatment, we will call you to review the results   Follow-Up: At [facility], you and your health needs are our priority.  As part of our continuing mission to provide you with exceptional heart care, we have created designated Provider Care Teams.  These Care Teams include your primary Cardiologist (physician) and Advanced Practice Providers (APPs -  Physician Assistants and Nurse Practitioners) who all work together to provide you with the care you need, when you need it.  We recommend signing up for the patient portal called "MyChart".  Sign up information is provided on this  After Visit Summary.   MyChart is used to connect with patients for Virtual Visits (Telemedicine).  Patients are able to view lab/test results, encounter notes, upcoming appointments, etc.  Non-urgent messages can be sent to your provider as well.   To learn more about what you can do with MyChart, go to [email or web address].    Your next appointment:   3 month(s)  The format for your next appointment:   In Person  Provider:   Sanda Klein, MD [address]         Signed, Sanda Klein, MD  09/09/2019 10:23 AM    [address], [place], [place]  [address] Phone: [phone]; Fax: [phone]

## 2019-09-09 NOTE — Patient Instructions (Signed)
Medication Instructions:  NO CHANGE *If you need a refill on your cardiac medications before your next appointment, please call your pharmacy*   Lab Work: If you have labs (blood work) drawn today and your tests are completely normal, you will receive your results only by: Marland Kitchen MyChart Message (if you have MyChart) OR . A paper copy in the mail If you have any lab test that is abnormal or we need to change your treatment, we will call you to review the results   Follow-Up: At [facility], you and your health needs are our priority.  As part of our continuing mission to provide you with exceptional heart care, we have created designated Provider Care Teams.  These Care Teams include your primary Cardiologist (physician) and Advanced Practice Providers (APPs -  Physician Assistants and Nurse Practitioners) who all work together to provide you with the care you need, when you need it.  We recommend signing up for the patient portal called "MyChart".  Sign up information is provided on this After Visit Summary.  MyChart is used to connect with patients for Virtual Visits (Telemedicine).  Patients are able to view lab/test results, encounter notes, upcoming appointments, etc.  Non-urgent messages can be sent to your provider as well.   To learn more about what you can do with MyChart, go to [email or web address].    Your next appointment:   3 month(s)  The format for your next appointment:   In Person  Provider:   Sanda Klein, MD PACER CHECK

## 2019-09-10 ENCOUNTER — Other Ambulatory Visit: Payer: Self-pay

## 2019-09-10 ENCOUNTER — Ambulatory Visit (INDEPENDENT_AMBULATORY_CARE_PROVIDER_SITE_OTHER): Payer: PPO | Admitting: Neurology

## 2019-09-10 ENCOUNTER — Encounter: Payer: Self-pay | Admitting: Neurology

## 2019-09-10 VITALS — BP 122/80 | HR 75 | Ht 67.0 in | Wt 145.3 lb

## 2019-09-10 DIAGNOSIS — S060X1D Concussion with loss of consciousness of 30 minutes or less, subsequent encounter: Secondary | ICD-10-CM | POA: Diagnosis not present

## 2019-09-10 DIAGNOSIS — Z950 Presence of cardiac pacemaker: Secondary | ICD-10-CM

## 2019-09-10 DIAGNOSIS — Z9181 History of falling: Secondary | ICD-10-CM

## 2019-09-10 NOTE — Progress Notes (Signed)
Thank you! If you order the MRI, I will coordinate the necessary pacemaker monitoring. [place] is preferable. Just let me know date, time. Delano Scardino

## 2019-09-10 NOTE — Progress Notes (Signed)
Provider:  Larey Seat, MD  Primary Care Physician:  Lorrene Reid, PA-C [place] [address]     Referring Provider: same / ED.          Chief Complaint according to patient   Patient presents with:    . New Patient (Initial Visit)     Pt fell on 08/21/2019 at home and hit her head and went to ED for work up. Pt reports she is here for consult. Pt denies dizziness today. Pt here per request of ED.      HISTORY OF PRESENT ILLNESS:  Brandy [name] is a 64 y.o. year old  Caucasian female patient and seen here upon referral on 09/10/2019. Chief concern according to patient :  Dr Benn Moulder felt her fall and LOC was not likely cardiac related. Patient was hospitalized from May 20- 22nd.    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 She fell and cannot recall how and why, lost awareness- the fall took place in her driveway , on gravel.  The patient recalls that it was in the morning around 9 AM when she fell.  She had not experienced any palpitations, dizziness or tunnel vision before no diaphoresis no indication of stress of cardiac dysfunction.  She saw Dr. Benn Moulder yesterday who already cleared from the cardiology standpoint.  During hospitalization diagnosis of orthostatic hypotension was made a phenomenon that is new to her. She never felt that she was posturally challenged. She was amnestic for th event, not sure how long she was out, and stayed amnestic for the rest of that day. Fell flat forwards on her head but broke a tooth and a toe. Bit her tongue.    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   Not vaccinated against COVID and not interested.       Review of Systems: Out of a complete 14 system review, the patient complains of only the following symptoms, and all other reviewed systems are negative.:      Social History   Socioeconomic History  . Marital status: Widowed    Spouse name: Not on file  . Number of children: Not on file  . Years of education: Not on file  . Highest education level: Not on file  Occupational History  . Not on file  Tobacco Use  . Smoking status: Never Smoker  . Smokeless tobacco: Never Used  Substance and Sexual Activity  . Alcohol use: Not Currently  . Drug use: Never  . Sexual activity: Not Currently    Birth control/protection: None  Other Topics Concern  . Not on file  Social History Narrative  . Not on file   Social Determinants of Health   Financial Resource Strain:   . Difficulty of Paying Living Expenses:   Food Insecurity:   . Worried About Charity fundraiser in the Last Year:   . Arboriculturist in the Last Year:   Transportation Needs:   . Film/video editor (Medical):   Marland Kitchen Lack of Transportation (Non-Medical):   Physical Activity:   . Days of Exercise per  Week:   . Minutes of Exercise per Session:   Stress:   . Feeling of Stress :   Social Connections:   . Frequency of Communication with Friends and Family:   . Frequency of Social Gatherings with Friends and Family:   . Attends Religious Services:   . Active Member of Clubs or Organizations:   . Attends Archivist Meetings:   Marland Kitchen Marital Status:     Family History  Problem Relation Age of Onset  . Hypertension Mother   . Coronary artery disease Mother   . Depression Mother   . Heart disease Mother   . Diabetes Father   . Heart disease Father   . Depression Father   . Hypertension Father   . Hypertension Sister   . Alcohol abuse Sister   . Depression  Sister   . Diabetes Paternal Uncle   . Cancer Maternal Grandmother         colon  . Heart disease Maternal Grandfather   . Diabetes Maternal Grandfather   . Alcohol abuse Paternal Grandfather   . Heart disease Paternal Grandfather     Past Medical History:  Diagnosis Date  . Allergic rhinitis   . Asthmatic bronchitis   . Complete heart block ([place])   . GERD (gastroesophageal reflux disease)   . MRSA (methicillin resistant Staphylococcus aureus) 2013   left buttock  . Nonsustained ventricular tachycardia ([place])   . 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 Vaginitis   . Vocal cord paresis     Past Surgical History:  Procedure Laterality Date  . ACROMIO-CLAVICULAR JOINT REPAIR Left 12/10/2014   Procedure: LEFT CORACOCLAVICULAR RECONSTRUCTION WITH  ZIPLOOP;  Surgeon: Tania Ade, MD;  Location: [facility];  Service: Orthopedics;  Laterality: Left;  . APPENDECTOMY  05-2010  . DILATION AND CURETTAGE OF UTERUS  1978  . HAND SURGERY    . PACEMAKER INSERTION  08/05/08   Medtronic Adapta  . Vocal cord splint  2005     Current Outpatient Medications on File Prior to Visit  Medication Sig Dispense Refill  . ALPRAZolam (XANAX) 0.25 MG tablet Take 0.25 mg by mouth at bedtime as needed for sleep.     Marland Kitchen atorvastatin (LIPITOR) 20 MG tablet Take 1 tablet (20 mg total) by mouth daily. 30 tablet 0  . Calcium Carbonate-Vitamin D (CALCIUM 500 + D) 500-125 MG-UNIT TABS Take 1 tablet by mouth daily.      Marland Kitchen FLUoxetine (PROZAC) 20 MG capsule Take 20 mg by mouth daily.     . fluticasone (FLONASE) 50 MCG/ACT nasal spray USE 1 SPRAY IN EACH NOSTRIL DAILY (Patient taking differently: Place 1 spray into both nostrils daily. USE 1 SPRAY IN EACH NOSTRIL DAILY) 16 g 5  . metoprolol succinate (TOPROL-XL) 50 MG 24 hr tablet Take 1 tablet (50 mg total) by mouth daily. Take with or immediately following a meal. 90 tablet 3  . rivaroxaban (XARELTO) 20 MG TABS tablet Take  1 tablet (20 mg total) by mouth daily with supper. 30 tablet 0  . valACYclovir (VALTREX) 1000 MG tablet Take 1 tablet by mouth daily as needed (cold sores).      No current facility-administered medications on file prior to visit.    Allergies  Allergen Reactions  . Erythromycin   . Sulfa Antibiotics Rash    Urticarial rash developed 1 day after completing a 14 day course of TMP/SMX     Ref Range & Units 2  wk ago  (08/22/19) 2 wk ago  (08/21/19)  Sodium 135 - 145 mmol/L 140    Potassium 3.5 - 5.1 mmol/L 3.7    Chloride 98 - 111 mmol/L 107    CO2 22 - 32 mmol/L 25    Glucose, Bld 70 - 99 mg/dL 97    Comment: Glucose reference range applies only to samples taken after fasting for at least 8 hours.  BUN 8 - 23 mg/dL 19    Creatinine, Ser 0.44 - 1.00 mg/dL 0.92    Calcium 8.9 - 10.3 mg/dL 8.8Low     Total Protein 6.5 - 8.1 g/dL 5.8Low   6.5   Albumin 3.5 - 5.0 g/dL 3.3Low   3.7   AST 15 - 41 U/L 17  20   ALT 0 - 44 U/L 14  17   Alkaline Phosphatase 38 - 126 U/L 66  79   Total Bilirubin 0.3 - 1.2 mg/dL 0.6  0.9   GFR calc non Af Amer >60 mL/min >60    GFR calc Af Amer >60 mL/min >60    Anion gap 5 - 15 8       Physical exam:  Today's Vitals   09/10/19 0820  BP: 122/80  Pulse: 75  SpO2: 100%  Weight: 145 lb 5 oz (65.9 kg)  Height: 5\' 7"  (1.702 m)   Body mass index is 22.76 kg/m.   Wt Readings from Last 3 Encounters:  09/10/19 145 lb 5 oz (65.9 kg)  09/09/19 145 lb 3.2 oz (65.9 kg)  09/08/19 146 lb (66.2 kg)     Ht Readings from Last 3 Encounters:  09/10/19 5\' 7"  (1.702 m)  09/09/19 5\' 7"  (1.702 m)  09/08/19 5\' 7"  (1.702 m)        General: The patient is awake, alert and appears not in acute distress. The patient is well groomed. Head: Normocephalic, atraumatic. Neck is supple. Mallampati   neck circumference: 14 inches .  Nasal airflow  patent.  Retrognathia is not  seen.  Dental status: broke left front tooth. I cannot identify a tongue bite mark .    Cardiovascular:  Regular rate and cardiac rhythm by pulse,  without distended neck veins. Respiratory: Lungs are clear to auscultation.  Skin:  Without evidence of ankle edema, or rash. Trunk: The patient's posture is erect.   Neurologic exam : The patient is awake and alert, oriented to place and time.   Memory subjective described as intact.  Attention span & concentration ability appears normal.  Speech is fluent, without  dysarthria, dysphonia or aphasia.  Mood and affect are appropriate.   Cranial nerves: no loss of smell or taste reported  Pupils are equal and briskly reactive to light.  Funduscopic exam deferred.  Extraocular movements in vertical and horizontal planes were not smooth, but without clear nystagmus.  No Diplopia. Visual fields by finger perimetry are intact. Hearing was IMPAIRED to soft voice and finger rubbing.  Hearing aids inplace.   Facial sensation intact to fine touch.  Facial motor strength is symmetric and tongue and uvula move midline.  Neck ROM : rotation, tilt and flexion extension were normal for age and shoulder shrug was symmetrical.    Motor exam:  Symmetric bulk, tone and ROM.   Normal tone without cog wheeling, symmetric grip strength .  Sensory:  Fine touch and vibration were  normal.  Proprioception tested in the upper extremities was normal.  Coordination: Rapid alternating movements in the fingers/hands were of normal  speed.  The Finger-to-nose maneuver was intact without evidence of ataxia, dysmetria or tremor. Gait and station: Patient could rise unassisted from a seated position, walked without assistive device.  Stance is of normal width/ base , quickly, steady, and the patient turned with 2 steps.  Toe and heel walk were deferred.  Deep tendon reflexes: in the upper and lower extremities are symmetric, very brisk - 3 plus for patella.  Babinski response was upgoing on the right ! Equivocal on the left.        After spending a  total time of 45 minutes face to face and additional time for physical and neurologic examination, review of laboratory studies,  personal review of imaging studies, reports and results of other testing and review of referral information / records as far as provided in visit, I have established the following assessments:  1)  Amnestic event after fall with frontal head injury.   2)  asymmetric Babinski response, hyperreflexia. Marland Kitchen   3) history of cardiac arrhthymias , pacemaker, newly diagnosed orthostatic BPchanges.    My Plan is to proceed with:  1) Imaging study to confirm there is no blood, stroke, etc. MRI with and without contrast would be best- will need cardiology to help with pacemaker pause settings.  2) repeat EEG  3) no high suspecion of seizure, rather concussion, contusion.  Hydrate well, low caffeine.   I would like to thank Lorrene Reid, PA-C and Dr. Benn Moulder for allowing me to meet with and to take care of this pleasant patient.   In short, Brandy [name] is presenting with the residual of a possible TBI.  Jaw and head still hurting.   I plan to follow up either personally or through our NP within 2 month.   CC: I will share my notes with PCP .  Electronically signed by: Larey Seat, MD 09/10/2019 8:39 AM  [facility] Board certified by The [employer] of Sleep Medicine and Diplomate of the [employer] of Sleep Medicine. Board certified In Neurology through the [place], Fellow of the [employer] of Neurology. Medical Director of [facility].

## 2019-09-10 NOTE — Patient Instructions (Signed)

## 2019-09-11 ENCOUNTER — Telehealth: Payer: Self-pay | Admitting: Neurology

## 2019-09-11 NOTE — Telephone Encounter (Signed)
Brandy [name] with [facility] scheduling at [facility] reached out to let us know the pacemaker the pt has is not MRI safe and they cannot proceed with the order.

## 2019-09-11 NOTE — Telephone Encounter (Signed)
We will coordinate with [place] [facility]. Thank you , Mihai.  I will have our coordinator Raquel Sarna and my Nurse Gerline Legacy confer the time and date of the MRI .  Larey Seat, MD

## 2019-09-11 NOTE — Telephone Encounter (Signed)
The patient and her daughter were assured by Dr Benn Moulder  That the pacemaker will be  MRI compatible.

## 2019-09-11 NOTE — Telephone Encounter (Signed)
Health team no Dillon Bjork Auth: [id number] (exp. 09/11/19 to 03/08/20) faxed pacemaker info to [facility]. If MRI safe they will reach out to the patient to schedule.

## 2019-09-11 NOTE — Telephone Encounter (Signed)
If you can just get me a schedule time and date, we will have our Pacemaker Clinic Nurse Practitioner, Chanetta Marshall, coordinate the appropriate pacemaker management for the MRI. I have already spoken to her. Thanks!

## 2019-09-15 ENCOUNTER — Other Ambulatory Visit: Payer: Self-pay | Admitting: Internal Medicine

## 2019-09-16 NOTE — Telephone Encounter (Signed)
I left a voicemail with [name] to give me a call back about this patient pacemaker.

## 2019-09-16 NOTE — Telephone Encounter (Signed)
[name] called me back and informed me that she spoke to [facility] rep and she stated that the lead are safe but the pacer is not safe and the battery will not be changed until 18 months.

## 2019-09-17 NOTE — Telephone Encounter (Signed)
I have forwarded this conversation to Dr. Loletha Grayer and NP from the pacemaker clinic.  If they can do an MRI I would be happier, but if not, will go on with our work up without.  Cc - nurse Gerline Legacy and Raquel Sarna

## 2019-09-18 DIAGNOSIS — F4323 Adjustment disorder with mixed anxiety and depressed mood: Secondary | ICD-10-CM | POA: Diagnosis not present

## 2019-09-19 ENCOUNTER — Telehealth: Payer: Self-pay | Admitting: Cardiovascular Disease

## 2019-09-19 MED ORDER — RIVAROXABAN 20 MG PO TABS: 20.0000 mg | ORAL_TABLET | Freq: Every day | ORAL | 2 refills | Status: DC

## 2019-09-19 MED ORDER — ATORVASTATIN CALCIUM 20 MG PO TABS: 20.0000 mg | ORAL_TABLET | Freq: Every day | ORAL | 6 refills | Status: DC

## 2019-09-19 NOTE — Telephone Encounter (Signed)
*  STAT* If patient is at the pharmacy, call can be transferred to refill team.   1. Which medications need to be refilled? (please list name of each medication and dose if known)  rivaroxaban (XARELTO) 20 MG TABS tablet atorvastatin (LIPITOR) 20 MG tablet  2. Which pharmacy/location (including street and city if local pharmacy) is medication to be sent to?  [facility] [place].  3. Do they need a 30 day or 90 day supply? 30 with refills   Pt was prescribed this medication while in the hospital and thought Dr. Loletha Grayer would continue to write this medication. She has not gotten any new rx yet

## 2019-09-19 NOTE — Addendum Note (Signed)
Addended by: Harrington Challenger on: 09/19/2019 02:50 PM   Modules accepted: Orders

## 2019-09-19 NOTE — Telephone Encounter (Signed)
64yo Female Wt 65.9kg Scr=0.92 on 08/22/19  CrCl = 54ml/min

## 2019-09-25 ENCOUNTER — Other Ambulatory Visit: Payer: Self-pay | Admitting: Cardiovascular Disease

## 2019-10-02 DIAGNOSIS — F4323 Adjustment disorder with mixed anxiety and depressed mood: Secondary | ICD-10-CM | POA: Diagnosis not present

## 2019-10-02 NOTE — Telephone Encounter (Signed)
Pt has questions about MRI, please call

## 2019-10-08 ENCOUNTER — Ambulatory Visit (INDEPENDENT_AMBULATORY_CARE_PROVIDER_SITE_OTHER): Payer: PPO | Admitting: *Deleted

## 2019-10-08 DIAGNOSIS — I442 Atrioventricular block, complete: Secondary | ICD-10-CM

## 2019-10-08 LAB — CUP PACEART REMOTE DEVICE CHECK
Battery Impedance: 3241 Ohm
Battery Remaining Longevity: 16 mo
Battery Voltage: 2.7 V
Brady Statistic AP VP Percent: 94 %
Brady Statistic AP VS Percent: 0 %
Brady Statistic AS VP Percent: 6 %
Brady Statistic AS VS Percent: 0 %
Date Time Interrogation Session: 20210707074520
Implantable Lead Implant Date: 20100505
Implantable Lead Implant Date: 20100505
Implantable Lead Location: 753859
Implantable Lead Location: 753860
Implantable Lead Model: 5076
Implantable Lead Model: 5076
Implantable Pulse Generator Implant Date: 20100505
Lead Channel Impedance Value: 455 Ohm
Lead Channel Impedance Value: 641 Ohm
Lead Channel Pacing Threshold Amplitude: 0.5 V
Lead Channel Pacing Threshold Amplitude: 0.625 V
Lead Channel Pacing Threshold Pulse Width: 0.4 ms
Lead Channel Pacing Threshold Pulse Width: 0.4 ms
Lead Channel Setting Pacing Amplitude: 2 V
Lead Channel Setting Pacing Amplitude: 2.5 V
Lead Channel Setting Pacing Pulse Width: 0.4 ms
Lead Channel Setting Sensing Sensitivity: 2 mV

## 2019-10-09 ENCOUNTER — Telehealth: Payer: Self-pay | Admitting: Neurology

## 2019-10-09 ENCOUNTER — Other Ambulatory Visit: Payer: Self-pay

## 2019-10-09 ENCOUNTER — Ambulatory Visit (INDEPENDENT_AMBULATORY_CARE_PROVIDER_SITE_OTHER): Payer: PPO | Admitting: Neurology

## 2019-10-09 DIAGNOSIS — R55 Syncope and collapse: Secondary | ICD-10-CM | POA: Diagnosis not present

## 2019-10-09 DIAGNOSIS — Z950 Presence of cardiac pacemaker: Secondary | ICD-10-CM

## 2019-10-09 DIAGNOSIS — S060X1D Concussion with loss of consciousness of 30 minutes or less, subsequent encounter: Secondary | ICD-10-CM

## 2019-10-09 DIAGNOSIS — Z9181 History of falling: Secondary | ICD-10-CM

## 2019-10-09 NOTE — Telephone Encounter (Signed)
I have called the patient back. I advised her of the previous message from June 16th. Advised that it was left in hands of the cardiologist and NP to help coordinate the MRI. Informed her that I have sent a message to the NP that Dr Dohmeier advised me to reach out and am waiting to hear back. Basically Cardiologist says she can have a MRI and [employer] is saying no she can't and sounds like they need to get together and coordinate with the MRI scheduler.   At this point, I advised that the patient could also reach out to Dr C office and discuss how to get that process rolling. She was appreciative for the call and will do that.

## 2019-10-09 NOTE — Progress Notes (Signed)
Remote pacemaker transmission.   

## 2019-10-09 NOTE — Telephone Encounter (Signed)
I will discuss with Dr Brett Fairy. It was last left off with waiting to see if cardiology would confirm if pt could complete or not. I am unsure if cardiology has investigated this.

## 2019-10-09 NOTE — Telephone Encounter (Signed)
Patient here today for EEG.  Questioned MRI scheduling as she has not heard back. Checked with Raquel Sarna. She said she was not sure what Dr. Brett Fairy wanted to do ??  Best call back is [phone]

## 2019-10-15 ENCOUNTER — Telehealth: Payer: Self-pay | Admitting: Cardiovascular Disease

## 2019-10-15 NOTE — Telephone Encounter (Signed)
New Message:    Pt says she would like to talk to Dr [facility] nurse. She is having problems getting her MRI scheduled.

## 2019-10-16 DIAGNOSIS — F4323 Adjustment disorder with mixed anxiety and depressed mood: Secondary | ICD-10-CM | POA: Diagnosis not present

## 2019-10-16 NOTE — Telephone Encounter (Signed)
Spoke with patient regarding MRI (ordered by Dr. Brett Fairy) appointment---she states this has been scheduled for 10/23/19.

## 2019-10-20 ENCOUNTER — Ambulatory Visit (INDEPENDENT_AMBULATORY_CARE_PROVIDER_SITE_OTHER): Payer: PPO | Admitting: Podiatry

## 2019-10-20 ENCOUNTER — Encounter: Payer: Self-pay | Admitting: Podiatry

## 2019-10-20 ENCOUNTER — Other Ambulatory Visit: Payer: Self-pay

## 2019-10-20 DIAGNOSIS — M2041 Other hammer toe(s) (acquired), right foot: Secondary | ICD-10-CM

## 2019-10-20 DIAGNOSIS — M21611 Bunion of right foot: Secondary | ICD-10-CM | POA: Diagnosis not present

## 2019-10-20 NOTE — Progress Notes (Signed)
Subjective: 64 y.o. female presenting today for follow-up treatment and evaluation regarding a bunion and hammertoe deformity to the right foot.  Patient was last seen in the office on 08/13/2019.  At that time she stated that she would like to have surgery performed in November 2021.  However she had a syncopal episode at her home and now she is unable to drive for the following 6 months.  She would like to have surgery while she is restricted from driving for this next 6 months.  She presents for for surgical consult and further treatment evaluation  Past Medical History:  Diagnosis Date  . Allergic rhinitis   . Asthmatic bronchitis   . Complete heart block ([place])   . GERD (gastroesophageal reflux disease)   . MRSA (methicillin resistant Staphylococcus aureus) 2011-07-03   left buttock  . Nonsustained ventricular tachycardia ([place])   . 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 Vaginitis   . Vocal cord paresis      Objective: Physical Exam General: The patient is alert and oriented x3 in no acute distress.  Dermatology: Skin is cool, dry and supple bilateral lower extremities. Negative for open lesions or macerations.  Vascular: Palpable pedal pulses bilaterally. No edema or erythema noted. Capillary refill within normal limits.  Neurological: Epicritic and protective threshold grossly intact bilaterally.   Musculoskeletal Exam: Clinical evidence of bunion deformity noted to the respective foot. There is moderate pain on palpation range of motion of the first MPJ. Lateral deviation of the hallux noted consistent with hallux abductovalgus. Hammertoe contracture also noted on clinical exam to the 2nd digit of the right foot. Symptomatic pain on palpation and range of motion also noted to the metatarsal phalangeal joints of the respective hammertoe digits.    Radiographic Exam: Increased intermetatarsal angle greater than 15 with a hallux abductus angle greater than  30 noted on AP view. Moderate degenerative changes noted within the first MPJ. Contracture deformity also noted to the interphalangeal joints and MPJs of the digits of the respective hammertoes.    Assessment: 1. HAV w/ bunion deformity right 2. 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 Patient was evaluated. X-Rays reviewed. 2. Today we discussed the conservative versus surgical management of the presenting pathology. The patient opts for surgical management. All possible complications and details of the procedure were explained. All patient questions were answered. No guarantees were expressed or implied. 3. Authorization for surgery was initiated today. Surgery will consist of bunionectomy with first metatarsal osteotomy right.  Second PIPJ arthroplasty right.  Second MTPJ capsulotomy right. 4.  Patient has a cam boot from a friend that she would like to use. 5.  Return to clinic 1 week postop  Retired. Husband passed away in 07/03/2018 from lung cancer. Works part time at [employer].   Presented today with her daughter, Cyril Mourning (only child).  She is an Therapist, sports for [employer].    Edrick Kins, DPM [facility]  Dr. Edrick Kins, [address]                                        [place], [place] [address]                Office [phone]  Fax [phone]

## 2019-10-23 ENCOUNTER — Other Ambulatory Visit (HOSPITAL_COMMUNITY): Payer: PPO

## 2019-10-27 NOTE — Telephone Encounter (Signed)
Patient is scheduled at [facility] for 10/28/19.

## 2019-10-28 ENCOUNTER — Encounter: Payer: Self-pay | Admitting: Neurology

## 2019-10-28 ENCOUNTER — Ambulatory Visit (HOSPITAL_COMMUNITY)
Admission: RE | Admit: 2019-10-28 | Discharge: 2019-10-28 | Disposition: A | Discharge status: Home / Self Care | Payer: PPO | Source: Ambulatory Visit | Attending: Neurology | Admitting: Neurology

## 2019-10-28 DIAGNOSIS — Z950 Presence of cardiac pacemaker: Secondary | ICD-10-CM

## 2019-10-28 DIAGNOSIS — S0990XA Unspecified injury of head, initial encounter: Secondary | ICD-10-CM | POA: Diagnosis not present

## 2019-10-28 DIAGNOSIS — I442 Atrioventricular block, complete: Secondary | ICD-10-CM | POA: Diagnosis not present

## 2019-10-28 DIAGNOSIS — I6782 Cerebral ischemia: Secondary | ICD-10-CM | POA: Diagnosis not present

## 2019-10-28 DIAGNOSIS — S060X1D Concussion with loss of consciousness of 30 minutes or less, subsequent encounter: Secondary | ICD-10-CM | POA: Diagnosis not present

## 2019-10-28 MED ORDER — GADOBUTROL 1 MMOL/ML IV SOLN
7.0000 mL | Freq: Once | INTRAVENOUS | Status: AC | PRN
  Administered 2019-10-28: 7 mL via INTRAVENOUS

## 2019-10-28 NOTE — Progress Notes (Signed)
Discussed risks/benefits of brain MRI with Brandy [name]. Needs MRI to clarify neurological diagnosis. Has complete heart block and is pacemaker dependent. Understands that although her leads are MRI conditional, the actual pacemaker generattor is not. Device check normal at baseline with excellent lead parameters. Reprogrammed DOO 90 bpm.  She understands the potential risks and is willing to proceed. We will be present for rhythm monitoring during the scan.  Sanda Klein, MD, [facility] [phone] office [phone] pager

## 2019-10-28 NOTE — Progress Notes (Signed)
No arrhythmic events during MRI scan. Device lead parameters rechecked, are unchanged. Programming returned to baseline settings DDDR.  Sanda Klein, MD, [facility] [phone] office [phone] pager

## 2019-10-28 NOTE — Progress Notes (Signed)
Subacute head trauma.  Traumatic brain injury. No acute or subacute finding. Chronic small-vessel ischemic changes affecting the brain, advanced in the pons and moderate in the cerebral hemispheric white matter.

## 2019-10-29 ENCOUNTER — Telehealth: Payer: Self-pay | Admitting: Neurology

## 2019-10-29 NOTE — Telephone Encounter (Signed)
-----   Message from Larey Seat, MD sent at 10/28/2019 11:58 AM EDT -----  Subacute head trauma.  Traumatic brain injury. No acute or subacute finding. Chronic small-vessel ischemic changes affecting the brain, advanced in the pons and moderate in the cerebral hemispheric white matter.

## 2019-10-29 NOTE — Telephone Encounter (Signed)
Called the patient to review the MRI results. Reviewed with her the findings and patient verbalized understanding. Pt had no questions at this time but was encouraged to call back if questions arise.

## 2019-10-30 ENCOUNTER — Other Ambulatory Visit: Payer: Self-pay | Admitting: Cardiovascular Disease

## 2019-10-30 ENCOUNTER — Telehealth: Payer: Self-pay

## 2019-10-30 NOTE — Telephone Encounter (Signed)
DOS 11/13/2019  [place] BUNIONECTOMY RT - 76808 CAPSULOTOMY, MPJ RELEASE 2ND RT - 28270 HAMMERTOE REPAIR 2ND RT - 28285  RECEIVED FAX FROM [facility] WITH AUTH FOR PT [phone] & [phone]   AUTH # [id number] GOOD FROM 11/13/19 - 02/11/2020

## 2019-11-06 DIAGNOSIS — F4323 Adjustment disorder with mixed anxiety and depressed mood: Secondary | ICD-10-CM | POA: Diagnosis not present

## 2019-11-06 NOTE — Procedures (Signed)
   HISTORY: 64 year old female, presented with loss of consciousness  TECHNIQUE:  This is a routine 16 channel EEG recording with one channel devoted to a limited EKG recording.  It was performed during wakefulness, drowsiness and asleep.  Hyperventilation and photic stimulation were performed as activating procedures.  There are minimum muscle and movement artifact noted.  Upon maximum arousal, posterior dominant waking rhythm consistent of mildly dysrhythmic theta range activity. Activities are symmetric over the bilateral posterior derivations and attenuated with eye opening.  Hyperventilation produced mild/moderate buildup with higher amplitude and the slower activities noted.  Photic stimulation did not alter the tracing.  During EEG recording, patient developed drowsiness and no deeper stage of sleep was achieved During EEG recording, there was no epileptiform discharge noted.  EKG demonstrate sinus rhythm, with heart rate of 84 bpm  CONCLUSION: This is mild abnormal EEG.  There is evidence of mild background slowing indicating mild bihemispheric malfunction, common etiology are metabolic toxic.  Marcial Pacas, M.D. Ph.D.  [facility] [place], [place] [address] Phone: [phone] Fax:      [phone]

## 2019-11-11 ENCOUNTER — Ambulatory Visit (INDEPENDENT_AMBULATORY_CARE_PROVIDER_SITE_OTHER): Payer: PPO | Admitting: Neurology

## 2019-11-11 ENCOUNTER — Encounter: Payer: Self-pay | Admitting: Neurology

## 2019-11-11 VITALS — BP 123/71 | HR 88 | Ht 67.0 in | Wt 142.0 lb

## 2019-11-11 DIAGNOSIS — R55 Syncope and collapse: Secondary | ICD-10-CM | POA: Insufficient documentation

## 2019-11-11 NOTE — Progress Notes (Signed)
Provider:  Larey Seat, MD  Primary Care Physician:  Lorrene Reid, PA-C [place] [address]     Referring Provider: same / ED.          Chief Complaint according to patient   Patient presents with:    . New Patient (Initial Visit)     Pt fell on 08/21/2019 at home and hit her head and went to ED for work up. Pt reports she is here for consult. Pt denies dizziness today. Patient now here to follow up on EEG and MRI- which was finally performed.        HISTORY OF PRESENT ILLNESS:  Brandy [name] is a 64 y.o. year old Caucasian female patient and seen here in a RV on 11/11/2019.  Chief concern according to patient :  Dr Benn Moulder felt her fall and LOC was not likely cardiac related. Patient was hospitalized from May 20- 22nd.  She was seen here in follow up consultation on 09-10-2019.  I have the pleasure of meeting Brandy [name] today in the presence of her daughter for follow-up appointment.  She had an unrestful.  Since her original hospitalization it was difficult to get approval for her MRI due to her implanted device.  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  #2 the patient underwent an EEG which my colleague Dr. Jenny Reichmann interpreted as "mildly abnormal.  She does not find any dysrhythmic activity she stated that there was some slowing overall results taking the posterior dominant background rhythm.  Based on this test and with a normal reaction to eye opening and eye closure and otherwise symmetric activity in the EEG I would state that this EEG is probably age related borderline slow and is not pathologically  abnormal.  She has a pacemaker ECG pattern as we know.  At this time she has been 3 months out from her events, she will not drive for another 3 months but she is allowed to use home equipment and gardening tools.  I have a very low threshold to let her return to all her activities of daily living.  She has reported no other spells, falls, confusional episodes.  The patient does confirm that she sometimes forgets but she is able to trace her way back in complete which she will had set up to do. Daughter agrees.     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 She fell and cannot recall how and why, lost awareness- the fall took place in her driveway , on gravel.  The patient recalls that it was in the morning around 9 AM when she fell.  She had not experienced any palpitations, dizziness or tunnel vision before no diaphoresis no indication of stress of cardiac dysfunction.  She saw Dr. Benn Moulder yesterday who already cleared from the cardiology standpoint.  During hospitalization diagnosis of orthostatic hypotension was made a phenomenon that is new to her. She never felt that she was posturally challenged. She was amnestic for th  event, not sure how long she was out, and stayed amnestic for the rest of that day. Fell flat forwards on her head but broke a tooth and a toe. Bit her tongue.   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   Not vaccinated against COVID and not interested.       Review of Systems: Out of a complete 14 system review, the patient complains of only the  following symptoms, and all other reviewed systems are negative.:   Some forgetfulness.      Social History   Socioeconomic History  . Marital status: Widowed    Spouse name: Not on file  . Number of children: Not on file  . Years of education: Not on file  . Highest education level: Not on file  Occupational History  . Not on file  Tobacco Use  . Smoking status: Never Smoker  . Smokeless tobacco: Never Used  Vaping Use  . Vaping Use: Never used  Substance and Sexual Activity  . Alcohol use: Not Currently  . Drug use: Never  . Sexual activity: Not Currently    Birth control/protection: None  Other Topics Concern  . Not on file  Social History Narrative  . Not on file   Social Determinants of Health   Financial Resource Strain:   . Difficulty of Paying Living Expenses:   Food Insecurity:   . Worried About Charity fundraiser in the Last Year:   . Arboriculturist in the Last Year:   Transportation Needs:   . Film/video editor (Medical):   Marland Kitchen Lack of Transportation (Non-Medical):   Physical Activity:   . Days of Exercise per Week:   . Minutes of Exercise per Session:   Stress:   . Feeling of Stress :   Social Connections:   . Frequency of Communication with Friends and Family:   . Frequency of Social Gatherings with Friends and Family:   . Attends Religious Services:   . Active Member of Clubs or Organizations:   . Attends Archivist Meetings:   Marland Kitchen Marital Status:     Family History  Problem Relation Age of Onset  . Hypertension Mother   . Coronary artery disease Mother   . Depression Mother   . Heart disease Mother   . Diabetes Father   . Heart disease Father   . Depression Father   . Hypertension Father   . Hypertension Sister   . Alcohol abuse Sister   . Depression Sister   . Diabetes Paternal Uncle   . Cancer Maternal Grandmother         colon  . Heart disease Maternal Grandfather   . Diabetes Maternal Grandfather   . Alcohol  abuse Paternal Grandfather   . Heart disease Paternal Grandfather     Past Medical History:  Diagnosis Date  . Allergic rhinitis   . Asthmatic bronchitis   . Complete heart block ([place])   . GERD (gastroesophageal reflux disease)   . MRSA (methicillin resistant Staphylococcus aureus) 2013   left buttock  . Nonsustained ventricular tachycardia ([place])   . 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 Vaginitis   . Vocal cord paresis     Past Surgical History:  Procedure Laterality Date  .  ACROMIO-CLAVICULAR JOINT REPAIR Left 12/10/2014   Procedure: LEFT CORACOCLAVICULAR RECONSTRUCTION WITH  ZIPLOOP;  Surgeon: Tania Ade, MD;  Location: [facility];  Service: Orthopedics;  Laterality: Left;  . APPENDECTOMY  05-2010  . DILATION AND CURETTAGE OF UTERUS  1978  . HAND SURGERY    . PACEMAKER INSERTION  08/05/08   Medtronic Adapta  . Vocal cord splint  2005     Current Outpatient Medications on File Prior to Visit  Medication Sig Dispense Refill  . ALPRAZolam (XANAX) 0.25 MG tablet Take 0.25 mg by mouth at bedtime as needed for sleep.     Marland Kitchen atorvastatin (LIPITOR) 20 MG tablet Take 1 tablet (20 mg total) by mouth daily. 30 tablet 6  . Calcium Carbonate-Vitamin D (CALCIUM 500 + D) 500-125 MG-UNIT TABS Take 1 tablet by mouth daily.      Marland Kitchen FLUoxetine (PROZAC) 20 MG capsule Take 20 mg by mouth daily.     . fluticasone (FLONASE) 50 MCG/ACT nasal spray USE 1 SPRAY IN EACH NOSTRIL DAILY (Patient taking differently: Place 1 spray into both nostrils daily. USE 1 SPRAY IN EACH NOSTRIL DAILY) 16 g 5  . metoprolol succinate (TOPROL-XL) 50 MG 24 hr tablet TAKE 1 TABLET BY MOUTH DAILY WITH OR IMMEDIATELY FOLLOWING A MEAL 90 tablet 1  . rivaroxaban (XARELTO) 20 MG TABS tablet Take 1 tablet (20 mg total) by mouth daily with supper. 30 tablet 2  . valACYclovir (VALTREX) 1000 MG tablet Take 1 tablet by mouth daily as needed (cold sores).      No current  facility-administered medications on file prior to visit.    Allergies  Allergen Reactions  . Erythromycin   . Sulfa Antibiotics Rash    Urticarial rash developed 1 day after completing a 14 day course of TMP/SMX     Ref Range & Units 2 wk ago  (08/22/19) 2 wk ago  (08/21/19)  Sodium 135 - 145 mmol/L 140    Potassium 3.5 - 5.1 mmol/L 3.7    Chloride 98 - 111 mmol/L 107    CO2 22 - 32 mmol/L 25    Glucose, Bld 70 - 99 mg/dL 97    Comment: Glucose reference range applies only to samples taken after fasting for at least 8 hours.  BUN 8 - 23 mg/dL 19    Creatinine, Ser 0.44 - 1.00 mg/dL 0.92    Calcium 8.9 - 10.3 mg/dL 8.8Low     Total Protein 6.5 - 8.1 g/dL 5.8Low   6.5   Albumin 3.5 - 5.0 g/dL 3.3Low   3.7   AST 15 - 41 U/L 17  20   ALT 0 - 44 U/L 14  17   Alkaline Phosphatase 38 - 126 U/L 66  79   Total Bilirubin 0.3 - 1.2 mg/dL 0.6  0.9   GFR calc non Af Amer >60 mL/min >60    GFR calc Af Amer >60 mL/min >60    Anion gap 5 - 15 8       Physical exam:  There were no vitals filed for this visit. There is no height or weight on file to calculate BMI.   Wt Readings from Last 3 Encounters:  09/10/19 145 lb 5 oz (65.9 kg)  09/09/19 145 lb 3.2 oz (65.9 kg)  09/08/19 146 lb (66.2 kg)     Ht Readings from Last 3 Encounters:  09/10/19 5\' 7"  (1.702 m)  09/09/19 5\' 7"  (1.702 m)  09/08/19 5\' 7"  (1.702 m)  General: The patient is awake, alert and appears not in acute distress. The patient is well groomed. Head: Normocephalic, atraumatic. Neck is supple. Mallampati   neck circumference: 14 inches .  Nasal airflow  patent.  Retrognathia is not  seen.  Dental status: broke left front tooth. I cannot identify a tongue bite mark .  Cardiovascular:  Regular rate and cardiac rhythm by pulse,  without distended neck veins. Respiratory: Lungs are clear to auscultation.  Skin:  Without evidence of ankle edema, or rash. Trunk: The patient's posture is erect.   Neurologic  exam : The patient is awake and alert, oriented to place and time.   Memory subjective described as intact.  Sometimes delayed-  Attention span & concentration ability appears normal.  Speech is fluent, without dysarthria, dysphonia or aphasia.  Mood and affect are appropriate.   Cranial nerves: no loss of smell or taste reported  Pupils are equal and briskly reactive to light.  Funduscopic exam deferred.  Extraocular movements in vertical and horizontal planes were not smooth, but without clear nystagmus.  No Diplopia. Visual fields by finger perimetry are intact. Hearing was IMPAIRED to soft voice and finger rubbing.   Hearing aids inplace.   Facial motor strength is symmetric and tongue and uvula move midline.  Neck ROM : rotation, tilt and flexion extension were normal for age and shoulder shrug was symmetrical.    Motor exam:  Symmetric bulk, tone and ROM.   Normal tone without cog-wheeling, symmetric grip strength .  Coordination: Rapid alternating movements in the fingers/hands were of normal speed.  The Finger-to-nose maneuver was intact without evidence of ataxia, dysmetria or tremor. Gait and station: Patient could rise unassisted from a seated position, walked without assistive device.  Stance is of normal width/ base , quickly, steady, and the patient turned with 2 steps.  Toe and heel walk were deferred.  Deep tendon reflexes: in the upper and lower extremities are symmetric, very brisk - 3 plus for patella.  Babinski response was upgoing on the right ! Equivocal on the left.     After spending a total time of 15 minutes face to face and additional time for physical and neurologic examination, review of laboratory studies,  personal review of imaging studies, reports and results of other testing and review of referral information / records as far as provided in visit, I have established the following assessments:  1) Amnestic event after fall with frontal head injury. No  clear cause identified.   3) history of cardiac arrhthymias , pacemaker, newly diagnosed orthostatic BPchanges.    My Plan is to proceed with:  Complete the 6 month period of non-driving.   I would like to thank Lorrene Reid, PA-C and Dr. Benn Moulder for allowing me to meet with and to take care of this pleasant patient.   I I plan to follow up prn.   CC: I will share my notes with PCP .  Electronically signed by: Larey Seat, MD 11/11/2019 9:23 AM  [facility] Board certified by The [employer] of Sleep Medicine and Diplomate of the [employer] of Sleep Medicine. Board certified In Neurology through the [place], Fellow of the [employer] of Neurology. Medical Director of [facility].

## 2019-11-13 ENCOUNTER — Other Ambulatory Visit: Payer: Self-pay | Admitting: Podiatry

## 2019-11-13 DIAGNOSIS — M25571 Pain in right ankle and joints of right foot: Secondary | ICD-10-CM | POA: Diagnosis not present

## 2019-11-13 DIAGNOSIS — M2041 Other hammer toe(s) (acquired), right foot: Secondary | ICD-10-CM

## 2019-11-13 DIAGNOSIS — M7751 Other enthesopathy of right foot: Secondary | ICD-10-CM

## 2019-11-13 DIAGNOSIS — M2011 Hallux valgus (acquired), right foot: Secondary | ICD-10-CM

## 2019-11-13 DIAGNOSIS — K219 Gastro-esophageal reflux disease without esophagitis: Secondary | ICD-10-CM | POA: Diagnosis not present

## 2019-11-13 MED ORDER — OXYCODONE-ACETAMINOPHEN 5-325 MG PO TABS: 1.0000 | ORAL_TABLET | ORAL | 0 refills | Status: DC | PRN

## 2019-11-13 MED ORDER — IBUPROFEN 800 MG PO TABS: 800.0000 mg | ORAL_TABLET | Freq: Three times a day (TID) | ORAL | 1 refills | Status: DC

## 2019-11-13 NOTE — Progress Notes (Signed)
PRN postop 

## 2019-11-14 ENCOUNTER — Telehealth: Payer: Self-pay | Admitting: *Deleted

## 2019-11-14 NOTE — Telephone Encounter (Signed)
Pt states she can not the ibuprofen Dr. Ellard Artis gave her she is on a blood thinner.

## 2019-11-14 NOTE — Telephone Encounter (Signed)
I told pt that she did not need to take the ibuprofen, Dr. Amalia Hailey just gave that for additional pain coverage. Pt states understanding.

## 2019-11-19 ENCOUNTER — Ambulatory Visit (INDEPENDENT_AMBULATORY_CARE_PROVIDER_SITE_OTHER): Payer: PPO

## 2019-11-19 ENCOUNTER — Other Ambulatory Visit: Payer: Self-pay

## 2019-11-19 ENCOUNTER — Ambulatory Visit (INDEPENDENT_AMBULATORY_CARE_PROVIDER_SITE_OTHER): Payer: PPO | Admitting: Podiatry

## 2019-11-19 DIAGNOSIS — M21611 Bunion of right foot: Secondary | ICD-10-CM

## 2019-11-19 DIAGNOSIS — Z9889 Other specified postprocedural states: Secondary | ICD-10-CM

## 2019-11-19 DIAGNOSIS — M2041 Other hammer toe(s) (acquired), right foot: Secondary | ICD-10-CM

## 2019-11-19 MED ORDER — OXYCODONE-ACETAMINOPHEN 5-325 MG PO TABS: 1.0000 | ORAL_TABLET | ORAL | 0 refills | Status: DC | PRN

## 2019-11-19 NOTE — Progress Notes (Signed)
   Subjective:  Patient presents today status post bunionectomy and hammertoe repair second digit right foot. DOS: 11/13/2019.  Patient states that she is doing well.  She does have some intermittent pain throughout the day.  She denies fever chills nausea vomiting shortness of breath or chest pain.  She has been weightbearing in the cam boot as directed.  No new complaints at this time  Past Medical History:  Diagnosis Date  . Allergic rhinitis   . Asthmatic bronchitis   . Complete heart block ([place])   . GERD (gastroesophageal reflux disease)   . MRSA (methicillin resistant Staphylococcus aureus) 2013   left buttock  . Nonsustained ventricular tachycardia ([place])   . 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 Vaginitis   . Vocal cord paresis       Objective/Physical Exam Neurovascular status intact.  Skin incisions appear to be well coapted with sutures intact. No sign of infectious process noted. No dehiscence. No active bleeding noted. Moderate edema noted to the surgical extremity.  Radiographic Exam:  Orthopedic hardware and osteotomies sites appear to be stable with routine healing.  Assessment: 1. s/p bunionectomy with first metatarsal osteotomy and hammertoe repair second digit right foot. DOS: 11/13/2019   Plan of Care:  1. Patient was evaluated. X-rays reviewed 2.  Dressings changed today.  Keep clean dry and intact x1 week 3.  Continue weightbearing in the cam boot with the assistance of a walker 4.  Refill prescription for Percocet 5/325 mg 5.  Return to clinic in 1 week   Edrick Kins, DPM [facility]  Dr. Edrick Kins, [address] [place]                                        [place], [place] [address]                Office [phone]  Fax [phone]

## 2019-11-26 ENCOUNTER — Other Ambulatory Visit: Payer: Self-pay

## 2019-11-26 ENCOUNTER — Ambulatory Visit (INDEPENDENT_AMBULATORY_CARE_PROVIDER_SITE_OTHER): Payer: PPO | Admitting: Podiatry

## 2019-11-26 DIAGNOSIS — M2041 Other hammer toe(s) (acquired), right foot: Secondary | ICD-10-CM

## 2019-11-26 DIAGNOSIS — Z9889 Other specified postprocedural states: Secondary | ICD-10-CM

## 2019-11-26 DIAGNOSIS — M21611 Bunion of right foot: Secondary | ICD-10-CM

## 2019-11-26 NOTE — Progress Notes (Signed)
   Subjective:  Patient presents today status post bunionectomy and hammertoe repair second digit right foot. DOS: 11/13/2019.  Patient is doing very well.  She does have some tenderness to the surgical foot otherwise no new complaints at this time.  She has been weightbearing in the cam boot as directed.  Past Medical History:  Diagnosis Date  . Allergic rhinitis   . Asthmatic bronchitis   . Complete heart block ([place])   . GERD (gastroesophageal reflux disease)   . MRSA (methicillin resistant Staphylococcus aureus) 2013   left buttock  . Nonsustained ventricular tachycardia ([place])   . 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 Vaginitis   . Vocal cord paresis       Objective/Physical Exam Neurovascular status intact.  Skin incisions appear to be well coapted with sutures intact. No sign of infectious process noted. No dehiscence. No active bleeding noted. Moderate edema noted to the surgical extremity.  Assessment: 1. s/p bunionectomy with first metatarsal osteotomy and hammertoe repair second digit right foot. DOS: 11/13/2019   Plan of Care:  1. Patient was evaluated.  2.  Sutures removed today.  Patient may begin washing and showering and getting the foot wet 3.  Continue minimal weightbearing in the cam boot 4.  Recommend Ace wrap daily 5.  Return to clinic in 2 weeks for percutaneous pin removal, follow-up x-ray and to transition the patient out of the cam boot into the postsurgical shoe  Edrick Kins, DPM [facility]  Dr. Edrick Kins, [address]                                        [place], [place] [address]                Office [phone]  Fax [phone]

## 2019-12-09 ENCOUNTER — Ambulatory Visit (INDEPENDENT_AMBULATORY_CARE_PROVIDER_SITE_OTHER): Payer: PPO | Admitting: Cardiovascular Disease

## 2019-12-09 ENCOUNTER — Other Ambulatory Visit: Payer: Self-pay

## 2019-12-09 ENCOUNTER — Encounter: Payer: Self-pay | Admitting: Cardiovascular Disease

## 2019-12-09 VITALS — BP 135/71 | HR 84 | Ht 67.0 in | Wt 144.0 lb

## 2019-12-09 DIAGNOSIS — I442 Atrioventricular block, complete: Secondary | ICD-10-CM | POA: Diagnosis not present

## 2019-12-09 DIAGNOSIS — I472 Ventricular tachycardia: Secondary | ICD-10-CM | POA: Diagnosis not present

## 2019-12-09 DIAGNOSIS — R55 Syncope and collapse: Secondary | ICD-10-CM | POA: Diagnosis not present

## 2019-12-09 DIAGNOSIS — Z7901 Long term (current) use of anticoagulants: Secondary | ICD-10-CM | POA: Diagnosis not present

## 2019-12-09 DIAGNOSIS — I480 Paroxysmal atrial fibrillation: Secondary | ICD-10-CM | POA: Diagnosis not present

## 2019-12-09 DIAGNOSIS — Z950 Presence of cardiac pacemaker: Secondary | ICD-10-CM

## 2019-12-09 DIAGNOSIS — E7800 Pure hypercholesterolemia, unspecified: Secondary | ICD-10-CM | POA: Diagnosis not present

## 2019-12-09 DIAGNOSIS — I4729 Other ventricular tachycardia: Secondary | ICD-10-CM

## 2019-12-09 NOTE — Patient Instructions (Signed)

## 2019-12-09 NOTE — Progress Notes (Signed)
Patient ID: Brandy [name], female   DOB: [birth year], 64 y.o.   MRN: [id number]     Cardiology Office Note    Date:  12/09/2019   ID:  Brandy [name], DOB [birth year], MRN [id number]  PCP:  Lorrene Reid, PA-C  Cardiologist:   Sanda Klein, MD   No chief complaint on file.   History of Present Illness:  Brandy [name] is a 64 y.o. female with complete heart block who presents for pacemaker follow-up. She also has episodes of asymptomatic ventricular tachycardia (nonsustained) and paroxysmal atrial fibrillation that have been detected by her device.  Makailah had a syncopal event on May 20.  She was hanging out close.  She has no recollection of losing consciousness but apparently fell face first and injured herself.  Unclear how long she lost consciousness, but when she came to she went into the house and called her daughter.  When her daughter arrived, Brandy [name] was oriented to herself and to location but thought that "Brandy [name]" was president.  They subsequently went to the emergency room where she had worsening confusion over a period of several hours.  Apparently there was evidence of a small bites to the posterior part of her tongue, but she did not have sphincter tone loss.  She was asking the same question repeatedly and immediately forget what the circumstances were.  She apparently did not fully recover to normal until the next day.  She still has amnesia for almost all of the events of that day.  The next day she had documented orthostatic hypotension with her systolic blood pressure dropping from 150 down to 70. She was noted to have stenosis of the vertebral arteries on CT angiography and atorvastatin was also started.  Aspirin was not started at this point, since she was newly on oral anticoagulation.  No driving was recommended for 6 months.  She has a follow-up scheduled with Dr. Asencion Partridge Dohmeier tomorrow.  We have been able to perform an MRI since then and no major abnormalities were  found.  (Her leads are pacemaker conditional although her generator is a non-MRI Medtronic Adapta).  There were no major problems during the MRI.  LV EF is 50-55%, slightly low likely due to RV pacing.  No valvular abnormalities were seen.  The patient specifically denies any chest pain at rest exertion, dyspnea at rest or with exertion, orthopnea, paroxysmal nocturnal dyspnea, syncope, palpitations, focal neurological deficits, intermittent claudication, lower extremity edema, unexplained weight gain, cough, hemoptysis or wheezing.  She had bunionectomy and hammertoe surgery about 4 weeks ago and is wearing an orthopedic boot on the right foot.  Comprehensive device check in the office today shows normal function.  Her dual-chamber Medtronic Adapta was implanted in 2010 and has roughly 13 months of remaining longevity.  All lead parameters are excellent.  She has 99.6% ventricular pacing and is device dependent (no escape rhythm).  She also has 94% atrial pacing.  She has not had any recent atrial fibrillation.  2 episodes of nonsustained ventricular tachycardia were recorded in August, lasting 8 and 13 beats respectively at about 160 bpm.   Past Medical History:  Diagnosis Date  . Allergic rhinitis   . Asthmatic bronchitis   . Complete heart block ([place])   . GERD (gastroesophageal reflux disease)   . MRSA (methicillin resistant Staphylococcus aureus) 2013   left buttock  . Nonsustained ventricular tachycardia ([place])   . 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 Vaginitis   .  Vocal cord paresis     Past Surgical History:  Procedure Laterality Date  . ACROMIO-CLAVICULAR JOINT REPAIR Left 12/10/2014   Procedure: LEFT CORACOCLAVICULAR RECONSTRUCTION WITH  ZIPLOOP;  Surgeon: Tania Ade, MD;  Location: [facility];  Service: Orthopedics;  Laterality: Left;  . APPENDECTOMY  05-2010  . DILATION AND CURETTAGE OF UTERUS  1978  . HAND SURGERY    .  PACEMAKER INSERTION  08/05/08   Medtronic Adapta  . Vocal cord splint  2005    Outpatient Medications Prior to Visit  Medication Sig Dispense Refill  . ALPRAZolam (XANAX) 0.25 MG tablet Take 0.25 mg by mouth at bedtime as needed for sleep.     Marland Kitchen atorvastatin (LIPITOR) 20 MG tablet Take 1 tablet (20 mg total) by mouth daily. 30 tablet 6  . Calcium Carbonate-Vitamin D (CALCIUM 500 + D) 500-125 MG-UNIT TABS Take 1 tablet by mouth daily.      Marland Kitchen FLUoxetine (PROZAC) 20 MG capsule Take 20 mg by mouth daily.     . fluticasone (FLONASE) 50 MCG/ACT nasal spray USE 1 SPRAY IN EACH NOSTRIL DAILY (Patient taking differently: Place 1 spray into both nostrils daily. USE 1 SPRAY IN EACH NOSTRIL DAILY) 16 g 5  . metoprolol succinate (TOPROL-XL) 50 MG 24 hr tablet TAKE 1 TABLET BY MOUTH DAILY WITH OR IMMEDIATELY FOLLOWING A MEAL 90 tablet 1  . rivaroxaban (XARELTO) 20 MG TABS tablet Take 1 tablet (20 mg total) by mouth daily with supper. 30 tablet 2  . valACYclovir (VALTREX) 1000 MG tablet Take 1 tablet by mouth daily as needed (cold sores).     Marland Kitchen ibuprofen (ADVIL) 800 MG tablet Take 1 tablet (800 mg total) by mouth 3 (three) times daily. 90 tablet 1  . oxyCODONE-acetaminophen (PERCOCET) 5-325 MG tablet Take 1 tablet by mouth every 4 (four) hours as needed for severe pain. 30 tablet 0   No facility-administered medications prior to visit.     Allergies:   Erythromycin and Sulfa antibiotics   Social History   Socioeconomic History  . Marital status: Widowed    Spouse name: Not on file  . Number of children: Not on file  . Years of education: Not on file  . Highest education level: Not on file  Occupational History  . Not on file  Tobacco Use  . Smoking status: Never Smoker  . Smokeless tobacco: Never Used  Vaping Use  . Vaping Use: Never used  Substance and Sexual Activity  . Alcohol use: Not Currently  . Drug use: Never  . Sexual activity: Not Currently    Birth control/protection: None  Other  Topics Concern  . Not on file  Social History Narrative  . Not on file   Social Determinants of Health   Financial Resource Strain:   . Difficulty of Paying Living Expenses: Not on file  Food Insecurity:   . Worried About Charity fundraiser in the Last Year: Not on file  . Ran Out of Food in the Last Year: Not on file  Transportation Needs:   . Lack of Transportation (Medical): Not on file  . Lack of Transportation (Non-Medical): Not on file  Physical Activity:   . Days of Exercise per Week: Not on file  . Minutes of Exercise per Session: Not on file  Stress:   . Feeling of Stress : Not on file  Social Connections:   . Frequency of Communication with Friends and Family: Not on file  . Frequency of Social  Gatherings with Friends and Family: Not on file  . Attends Religious Services: Not on file  . Active Member of Clubs or Organizations: Not on file  . Attends Archivist Meetings: Not on file  . Marital Status: Not on file     Family History:  The patient's family history includes Alcohol abuse in her paternal grandfather and sister; Cancer in her maternal grandmother; Coronary artery disease in her mother; Depression in her father, mother, and sister; Diabetes in her father, maternal grandfather, and paternal uncle; Heart disease in her father, maternal grandfather, mother, and paternal grandfather; Hypertension in her father, mother, and sister.   ROS:   Please see the history of present illness.    ROS All other systems are reviewed and are negative.   PHYSICAL EXAM:   VS:  BP 135/71   Pulse 84   Ht 5\' 7"  (1.702 m)   Wt 144 lb (65.3 kg)   SpO2 100%   BMI 22.55 kg/m      General: Alert, oriented x3, no distress, healthy subclavian pacemaker site Head: no evidence of trauma, PERRL, EOMI, no exophtalmos or lid lag, no myxedema, no xanthelasma; normal ears, nose and oropharynx Neck: normal jugular venous pulsations and no hepatojugular reflux; brisk carotid  pulses without delay and no carotid bruits Chest: clear to auscultation, no signs of consolidation by percussion or palpation, normal fremitus, symmetrical and full respiratory excursions Cardiovascular: normal position and quality of the apical impulse, regular rhythm, normal first and paradoxically split second heart sounds, no murmurs, rubs or gallops Abdomen: no tenderness or distention, no masses by palpation, no abnormal pulsatility or arterial bruits, normal bowel sounds, no hepatosplenomegaly Extremities: no clubbing, cyanosis or edema; 2+ radial, ulnar and brachial pulses bilaterally; 2+ right femoral, posterior tibial and dorsalis pedis pulses; 2+ left femoral, posterior tibial and dorsalis pedis pulses; no subclavian or femoral bruits Neurological: grossly nonfocal Psych: Normal mood and affect   Wt Readings from Last 3 Encounters:  12/09/19 144 lb (65.3 kg)  11/11/19 142 lb (64.4 kg)  09/10/19 145 lb 5 oz (65.9 kg)      Studies/Labs Reviewed:  CT angiogram May 2021  IMPRESSION: 1. Atherosclerosis is mild for age in the neck, with no extracranial carotid or vertebral artery stenosis. Mild generalized arterial tortuosity.  2. Bilateral proximal subclavian artery plaque without significant stenosis. 3. Cervical spine and TMJ degeneration.  MRI of brain 10/28/2019  IMPRESSION: No acute or subacute finding. Chronic small-vessel ischemic changes affecting the brain, advanced in the pons and moderate in the cerebral hemispheric white matter.   EKG:  EKG is not ordered today.  The intracardiac electrogram shows AV sequential pacing with occasional atrial sensed beats.  Recent Labs: 08/21/2019: TSH 2.370 08/22/2019: ALT 14; BUN 19; Creatinine, Ser 0.92; Hemoglobin 12.9; Platelets 209; Potassium 3.7; Sodium 140   Lipid Panel    Component Value Date/Time   CHOL 223 (H) 08/21/2019 2100   CHOL 215 (H) 03/28/2018 0858   TRIG 55 08/21/2019 2100   HDL 71 08/21/2019 2100    HDL 61 03/28/2018 0858   CHOLHDL 3.1 08/21/2019 2100   VLDL 11 08/21/2019 2100   LDLCALC 141 (H) 08/21/2019 2100   LDLCALC 134 (H) 03/28/2018 0858      ASSESSMENT:    No diagnosis found.   PLAN:  In order of problems listed above:  1. Syncope: The exact mechanism remains uncertain.  MRI and EEG were unrevealing, but in my opinion the pattern of her symptoms  and the persistent disorientation and confusion when she was lying in bed is consistent with a neurological complaint rather than orthostatic hypotension.  No arrhythmia was recorded around the time of syncope. 2. NSVT: She continues to have occasional episodes of nonsustained VT, once or twice a month, always asymptomatic and brief. 3. CHB: She is pacemaker dependent.  There is no underlying escape rhythm. 4. PPM: Anticipate need for generator change out in about a year.  At that point she will have a Foley MRI conditional system.  We have successfully performed MRI of the brain even with her current pacemaker generator, without incident. 5. AFib: The burden of arrhythmia is very low, but CHA2DS2-VASc score is 2 (gender and age).  Now on full anticoagulation with Xarelto. 6. Anticoagulation: No bleeding complications to date. 7. HLP: Now on atorvastatin.  Not sure if she qualifies for PAD (plaque in the carotid and subclavian circulation was considered mild for age on CT angiography).  Ideally target LDL of less than 70.  Labs with PCP.  Medication Adjustments/Labs and Tests Ordered: Current medicines are reviewed at length with the patient today.  Concerns regarding medicines are outlined above.  Medication changes, Labs and Tests ordered today are listed in the Patient Instructions below. Patient Instructions  Medication Instructions:  No changes *If you need a refill on your cardiac medications before your next appointment, please call your pharmacy*   Lab Work: None ordered If you have labs (blood work) drawn today and your  tests are completely normal, you will receive your results only by: Marland Kitchen MyChart Message (if you have MyChart) OR . A paper copy in the mail If you have any lab test that is abnormal or we need to change your treatment, we will call you to review the results.   Testing/Procedures: None ordered   Follow-Up: At [facility], you and your health needs are our priority.  As part of our continuing mission to provide you with exceptional heart care, we have created designated Provider Care Teams.  These Care Teams include your primary Cardiologist (physician) and Advanced Practice Providers (APPs -  Physician Assistants and Nurse Practitioners) who all work together to provide you with the care you need, when you need it.  We recommend signing up for the patient portal called "MyChart".  Sign up information is provided on this After Visit Summary.  MyChart is used to connect with patients for Virtual Visits (Telemedicine).  Patients are able to view lab/test results, encounter notes, upcoming appointments, etc.  Non-urgent messages can be sent to your provider as well.   To learn more about what you can do with MyChart, go to [email or web address].    Your next appointment:   12 month(s)  The format for your next appointment:   In Person  Provider:   Sanda Klein, MD        Signed, Sanda Klein, MD  12/09/2019 6:41 PM    [address], [place], [place]  [address] Phone: [phone]; Fax: [phone]

## 2019-12-10 ENCOUNTER — Ambulatory Visit: Payer: PPO

## 2019-12-10 ENCOUNTER — Ambulatory Visit: Payer: PPO | Admitting: Podiatry

## 2019-12-10 ENCOUNTER — Ambulatory Visit (INDEPENDENT_AMBULATORY_CARE_PROVIDER_SITE_OTHER): Payer: PPO

## 2019-12-10 ENCOUNTER — Ambulatory Visit (INDEPENDENT_AMBULATORY_CARE_PROVIDER_SITE_OTHER): Payer: PPO | Admitting: Podiatry

## 2019-12-10 DIAGNOSIS — M2041 Other hammer toe(s) (acquired), right foot: Secondary | ICD-10-CM

## 2019-12-10 DIAGNOSIS — M21611 Bunion of right foot: Secondary | ICD-10-CM

## 2019-12-10 DIAGNOSIS — Z9889 Other specified postprocedural states: Secondary | ICD-10-CM

## 2019-12-10 MED ORDER — OXYCODONE-ACETAMINOPHEN 5-325 MG PO TABS: 1.0000 | ORAL_TABLET | Freq: Four times a day (QID) | ORAL | 0 refills | Status: DC | PRN

## 2019-12-10 NOTE — Progress Notes (Signed)
   Subjective:  Patient presents today status post bunionectomy and hammertoe repair second digit right foot. DOS: 11/13/2019.  Patient states she is doing very well however she has noticed an increased amount of edema to the surgical foot.  She does admit to walking more on the foot.  She has been weightbearing as tolerated in the cam boot as directed.  No new complaints at this time  Past Medical History:  Diagnosis Date  . Allergic rhinitis   . Asthmatic bronchitis   . Complete heart block ([place])   . GERD (gastroesophageal reflux disease)   . MRSA (methicillin resistant Staphylococcus aureus) 2013   left buttock  . Nonsustained ventricular tachycardia ([place])   . 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 Vaginitis   . Vocal cord paresis       Objective/Physical Exam Neurovascular status intact.  Skin incisions appear to be well coapted and healed. No sign of infectious process noted. No dehiscence. No active bleeding noted. Moderate edema noted to the surgical extremity.  Radiographic Exam:  Orthopedic hardware and osteotomies sites appear to be stable with routine healing.  There is some dorsal displacement of the osteotomy site seen on lateral view however the orthopedic screws appear to be intact and stable.  No movement noted.  The first ray is in good rectus alignment.  Assessment: 1. s/p bunionectomy with first metatarsal osteotomy and hammertoe repair second digit right foot. DOS: 11/13/2019   Plan of Care:  1. Patient was evaluated. X-rays reviewed 2.  Percutaneous fixation pin was removed today from the second toe 3.  A short cam boot was dispensed.  Discontinue tall cam boot.  Weightbearing as tolerated 4.  Continue Ace wrap daily 5.  Refill prescription for Percocet 5/325 mg 6.  Return to clinic in 4 weeks for follow-up x-ray and to transition the patient out of the cam boot   Edrick Kins, DPM [facility]  Dr. Edrick Kins, [address]                                        [place], [place] [address]                Office [phone]  Fax [phone]

## 2019-12-11 DIAGNOSIS — F4323 Adjustment disorder with mixed anxiety and depressed mood: Secondary | ICD-10-CM | POA: Diagnosis not present

## 2019-12-17 ENCOUNTER — Other Ambulatory Visit: Payer: Self-pay | Admitting: Cardiovascular Disease

## 2020-01-07 ENCOUNTER — Other Ambulatory Visit: Payer: Self-pay

## 2020-01-07 ENCOUNTER — Ambulatory Visit (INDEPENDENT_AMBULATORY_CARE_PROVIDER_SITE_OTHER): Payer: PPO

## 2020-01-07 ENCOUNTER — Ambulatory Visit (INDEPENDENT_AMBULATORY_CARE_PROVIDER_SITE_OTHER): Payer: PPO | Admitting: Podiatry

## 2020-01-07 DIAGNOSIS — M21611 Bunion of right foot: Secondary | ICD-10-CM | POA: Diagnosis not present

## 2020-01-07 DIAGNOSIS — Z9889 Other specified postprocedural states: Secondary | ICD-10-CM

## 2020-01-07 DIAGNOSIS — I442 Atrioventricular block, complete: Secondary | ICD-10-CM | POA: Diagnosis not present

## 2020-01-07 DIAGNOSIS — M2041 Other hammer toe(s) (acquired), right foot: Secondary | ICD-10-CM

## 2020-01-07 LAB — CUP PACEART REMOTE DEVICE CHECK
Battery Impedance: 4023 Ohm
Battery Remaining Longevity: 11 mo
Battery Voltage: 2.66 V
Brady Statistic AP VP Percent: 92 %
Brady Statistic AP VS Percent: 0 %
Brady Statistic AS VP Percent: 8 %
Brady Statistic AS VS Percent: 0 %
Date Time Interrogation Session: 20211006093323
Implantable Lead Implant Date: 20100505
Implantable Lead Implant Date: 20100505
Implantable Lead Location: 753859
Implantable Lead Location: 753860
Implantable Lead Model: 5076
Implantable Lead Model: 5076
Implantable Pulse Generator Implant Date: 20100505
Lead Channel Impedance Value: 465 Ohm
Lead Channel Impedance Value: 587 Ohm
Lead Channel Pacing Threshold Amplitude: 0.5 V
Lead Channel Pacing Threshold Amplitude: 0.625 V
Lead Channel Pacing Threshold Pulse Width: 0.4 ms
Lead Channel Pacing Threshold Pulse Width: 0.4 ms
Lead Channel Setting Pacing Amplitude: 2 V
Lead Channel Setting Pacing Amplitude: 2.5 V
Lead Channel Setting Pacing Pulse Width: 0.4 ms
Lead Channel Setting Sensing Sensitivity: 2 mV

## 2020-01-07 NOTE — Progress Notes (Signed)
   Subjective:  Patient presents today status post bunionectomy and hammertoe repair second digit right foot. DOS: 11/13/2019.  Patient states that she is feeling very well.  She denies any pain to the area.  She has been walking in the cam boot as instructed.  She is also been applying the Ace wrap.  No new complaints at this time  Past Medical History:  Diagnosis Date  . Allergic rhinitis   . Asthmatic bronchitis   . Complete heart block ([place])   . GERD (gastroesophageal reflux disease)   . MRSA (methicillin resistant Staphylococcus aureus) 2013   left buttock  . Nonsustained ventricular tachycardia ([place])   . 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 Vaginitis   . Vocal cord paresis       Objective/Physical Exam Neurovascular status intact.  Skin incisions appear to be well coapted and healed. No sign of infectious process noted. No dehiscence. No active bleeding noted.  Minimal edema noted to the surgical extremity.  There is some limited range of motion to the first and second MTPJ.  No pain with palpation or range of motion noted  Radiographic Exam:  Orthopedic hardware and osteotomies sites appear to be stable with routine healing.  There is some dorsal displacement of the osteotomy site seen on lateral view however the orthopedic screws appear to be intact and stable.  No movement noted.  The first ray is in good rectus alignment.  No significant change since last x-rays taken  Assessment: 1. s/p bunionectomy with first metatarsal osteotomy and hammertoe repair second digit right foot. DOS: 11/13/2019   Plan of Care:  1. Patient was evaluated. X-rays reviewed 2.  Patient may discontinue cam boot. 3.  Postsurgical shoe dispensed.  Weightbearing as tolerated 4.  Compression anklet dispensed.  Wear daily 5.  Return to clinic in 4 weeks  *Going to [place] at the end of this month for a mission trip.  Goes by [employer].   Edrick Kins, DPM [facility]  Dr. Edrick Kins, [address]                                        [place], [place] [address]                Office [phone]  Fax [phone]

## 2020-01-08 ENCOUNTER — Encounter: Payer: Self-pay | Admitting: Physician Assistant

## 2020-01-08 ENCOUNTER — Ambulatory Visit (INDEPENDENT_AMBULATORY_CARE_PROVIDER_SITE_OTHER): Payer: PPO | Admitting: Physician Assistant

## 2020-01-08 VITALS — HR 85 | Ht 67.0 in | Wt 144.0 lb

## 2020-01-08 DIAGNOSIS — Z1211 Encounter for screening for malignant neoplasm of colon: Secondary | ICD-10-CM

## 2020-01-08 DIAGNOSIS — F4323 Adjustment disorder with mixed anxiety and depressed mood: Secondary | ICD-10-CM | POA: Diagnosis not present

## 2020-01-08 DIAGNOSIS — Z0000 Encounter for general adult medical examination without abnormal findings: Secondary | ICD-10-CM

## 2020-01-08 NOTE — Progress Notes (Addendum)
Virtual Visit via Telephone Note:  I connected with Brandy [name] by telephone and verified that I am speaking with the correct person using two identifiers.    I discussed the limitations, risks, security and privacy concerns for performing an evaluation and management service by telephone and the availability of in person appointments. The staff discussed with patient that there may be a patient responsible charge related to this service. The patient expressed understanding and agreed to proceed.   Location of Patient- Home Location of Provider- Office    Subjective:   Brandy [name] is a 64 y.o. female who presents for Medicare Annual (Subsequent) preventive examination.  Review of Systems    General:   No F/C, wt loss Pulm:   No DIB, SOB, pleuritic chest pain Card:  No CP, palpitations Abd:  No n/v/d or pain Ext:  No inc edema from baseline   Objective:    Today's Vitals   01/08/20 1407  Pulse: 85  SpO2: 98%  Weight: 144 lb (65.3 kg)  Height: 5\' 7"  (1.702 m)   Body mass index is 22.55 kg/m.  Advanced Directives 02/21/2018 12/10/2014 12/08/2014 12/08/2014  Does Patient Have a Medical Advance Directive? Yes Yes Yes Yes  Type of Paramedic of [place];Living will [place];Living will [place];Living will [place];Living will  Does patient want to make changes to medical advance directive? - No - Patient declined - No - Patient declined  Copy of [place] in Chart? - No - copy requested - No - copy requested    Current Medications (verified) Outpatient Encounter Medications as of 01/08/2020  Medication Sig  . ALPRAZolam (XANAX) 0.25 MG tablet Take 0.25 mg by mouth at bedtime as needed for sleep.   Marland Kitchen atorvastatin (LIPITOR) 20 MG tablet Take 1 tablet (20 mg total) by mouth daily.  . Calcium Carbonate-Vitamin D (CALCIUM 500 + D) 500-125 MG-UNIT TABS Take 1 tablet by  mouth daily.    Marland Kitchen FLUoxetine (PROZAC) 20 MG capsule Take 20 mg by mouth daily.   . fluticasone (FLONASE) 50 MCG/ACT nasal spray USE 1 SPRAY IN EACH NOSTRIL DAILY (Patient taking differently: Place 1 spray into both nostrils daily. USE 1 SPRAY IN EACH NOSTRIL DAILY)  . metoprolol succinate (TOPROL-XL) 50 MG 24 hr tablet TAKE 1 TABLET BY MOUTH DAILY WITH OR IMMEDIATELY FOLLOWING A MEAL  . valACYclovir (VALTREX) 1000 MG tablet Take 1 tablet by mouth daily as needed (cold sores).   [name] 20 MG TABS tablet TAKE 1 TABLET BY MOUTH DAILY WITH SUPPER  . oxyCODONE-acetaminophen (PERCOCET) 5-325 MG tablet Take 1 tablet by mouth every 6 (six) hours as needed for severe pain.   No facility-administered encounter medications on file as of 01/08/2020.    Allergies (verified) Erythromycin and Sulfa antibiotics   History: Past Medical History:  Diagnosis Date  . Allergic rhinitis   . Asthmatic bronchitis   . Complete heart block ([place])   . GERD (gastroesophageal reflux disease)   . MRSA (methicillin resistant Staphylococcus aureus) 2013   left buttock  . Nonsustained ventricular tachycardia ([place])   . 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 Vaginitis   . Vocal cord paresis    Past Surgical History:  Procedure Laterality Date  . ACROMIO-CLAVICULAR JOINT REPAIR Left 12/10/2014   Procedure: LEFT CORACOCLAVICULAR RECONSTRUCTION WITH  ZIPLOOP;  Surgeon: Tania Ade, MD;  Location: [facility];  Service: Orthopedics;  Laterality: Left;  . APPENDECTOMY  05-2010  . COLON SURGERY N/A    Phreesia 01/06/2020  . DILATION AND CURETTAGE OF UTERUS  1978  . FRACTURE SURGERY N/A    Phreesia 01/06/2020  . HAND SURGERY    . PACEMAKER INSERTION  08/05/08   Medtronic Adapta  . Vocal cord splint  2005   Family History  Problem Relation Age of Onset  . Hypertension Mother   . Coronary artery disease Mother   . Depression Mother   . Heart disease Mother   .  Diabetes Father   . Heart disease Father   . Depression Father   . Hypertension Father   . Hypertension Sister   . Alcohol abuse Sister   . Depression Sister   . Diabetes Paternal Uncle   . Cancer Maternal Grandmother         colon  . Heart disease Maternal Grandfather   . Diabetes Maternal Grandfather   . Alcohol abuse Paternal Grandfather   . Heart disease Paternal Grandfather    Social History   Socioeconomic History  . Marital status: Widowed    Spouse name: Not on file  . Number of children: Not on file  . Years of education: Not on file  . Highest education level: Not on file  Occupational History  . Not on file  Tobacco Use  . Smoking status: Never Smoker  . Smokeless tobacco: Never Used  Vaping Use  . Vaping Use: Never used  Substance and Sexual Activity  . Alcohol use: Not Currently  . Drug use: Never  . Sexual activity: Not Currently    Birth control/protection: None  Other Topics Concern  . Not on file  Social History Narrative  . Not on file   Social Determinants of Health   Financial Resource Strain:   . Difficulty of Paying Living Expenses: Not on file  Food Insecurity:   . Worried About Charity fundraiser in the Last Year: Not on file  . Ran Out of Food in the Last Year: Not on file  Transportation Needs:   . Lack of Transportation (Medical): Not on file  . Lack of Transportation (Non-Medical): Not on file  Physical Activity:   . Days of Exercise per Week: Not on file  . Minutes of Exercise per Session: Not on file  Stress:   . Feeling of Stress : Not on file  Social Connections:   . Frequency of Communication with Friends and Family: Not on file  . Frequency of Social Gatherings with Friends and Family: Not on file  . Attends Religious Services: Not on file  . Active Member of Clubs or Organizations: Not on file  . Attends Archivist Meetings: Not on file  . Marital Status: Not on file    Tobacco Counseling Counseling given:  Not Answered    Diabetic?No      Activities of Daily Living In your present state of health, do you have any difficulty performing the following activities: 01/08/2020  Hearing? Y  Vision? N  Difficulty concentrating or making decisions? N  Walking or climbing stairs? N  Dressing or bathing? N  Doing errands, shopping? N  Some recent data might be hidden    Patient Care Team: Lorrene Reid, PA-C as PCP - General (Physician Assistant) Sanda Klein, MD as PCP - Cardiology (Cardiology) Gastroenterology, [facility] (Gastroenterology) [facility], [facility]  Indicate any recent Medical Services you may have received from other than  [facility] providers in the past year (date may be approximate).     Assessment:   This is a routine wellness examination for Brandy [name].  Hearing/Vision screen No exam data present  Dietary issues and exercise activities discussed: -Follow a heart healthy diet and continue walking. Increase activity as instructed by podiatrist.  Goals   None    Depression Screen PHQ 2/9 Scores 01/08/2020 09/08/2019 12/02/2018 04/01/2018 02/21/2018 02/05/2015  PHQ - 2 Score 0 2 1 4 3  0  PHQ- 9 Score 1 8 4 13 10  -    Fall Risk Fall Risk  01/08/2020 04/01/2018  Falls in the past year? 1 0  Number falls in past yr: 0 -  Injury with Fall? 1 -  Follow up Falls evaluation completed -    Any stairs in or around the home? No  If so, are there any without handrails? n/a Home free of loose throw rugs in walkways, pet beds, electrical cords, etc? Yes  Adequate lighting in your home to reduce risk of falls? Yes   ASSISTIVE DEVICES UTILIZED TO PREVENT FALLS:  Life alert? No  Use of a cane, walker or w/c? No  Grab bars in the bathroom? No  Shower chair or bench in shower? No  Elevated toilet seat or a handicapped toilet? Yes   TIMED UP AND GO:  Was the test performed? No .  Length of time to ambulate 10 feet:  sec.   TELEHEALTH  Cognitive Function:  wnl  6CIT Screen 01/08/2020  What Year? 0 points  What month? 0 points  What time? 0 points  Count back from 20 0 points  Months in reverse 0 points  Repeat phrase 0 points  Total Score 0    Immunizations Immunization History  Administered Date(s) Administered  . DTaP 08/09/2016  . Hepatitis A, Adult 04/30/2014, 10/29/2014  . Hepatitis B, adult 04/30/2014, 06/01/2014, 10/29/2014  . Influenza Split 01/02/2011, 02/05/2012  . Influenza Whole 01/01/2009, 01/01/2010  . Influenza,inj,Quad PF,6+ Mos 12/09/2012, 02/05/2015  . Influenza,inj,quad, With Preservative 01/14/2018  . PFIZER SARS-COV-2 Vaccination 12/04/2019  . Pneumococcal Polysaccharide-23 04/03/2005  . Tdap 06/28/2016  . Zoster 03/28/2018  . Zoster Recombinat (Shingrix) 02/21/2018, 04/05/2018    TDAP status: Up to date Flu Vaccine status: Declined, Education has been provided regarding the importance of this vaccine but patient still declined. Advised may receive this vaccine at local pharmacy or Health Dept. Aware to provide a copy of the vaccination record if obtained from local pharmacy or Health Dept. Verbalized acceptance and understanding. Pneumococcal vaccine status: Declined,  Education has been provided regarding the importance of this vaccine but patient still declined. Advised may receive this vaccine at local pharmacy or Health Dept. Aware to provide a copy of the vaccination record if obtained from local pharmacy or Health Dept. Verbalized acceptance and understanding.  Covid-19 vaccine status: Information provided on how to obtain vaccines.   Qualifies for Shingles Vaccine? Yes   Zostavax completed No   Shingrix Completed?: Yes  Screening Tests Health Maintenance  Topic Date Due  . Hepatitis C Screening  Never done  . PNA vac Low Risk Adult (1 of 2 - PCV13) 12/15/2018  . COLONOSCOPY  07/14/2019  . MAMMOGRAM  08/29/2019  . INFLUENZA VACCINE  11/02/2019  . COVID-19 Vaccine (2 - Pfizer 2-dose series)  12/25/2019  . TETANUS/TDAP  06/29/2026  . DEXA SCAN  Completed    Health Maintenance  Health Maintenance Due  Topic Date Due  . Hepatitis C Screening  Never  done  . PNA vac Low Risk Adult (1 of 2 - PCV13) 12/15/2018  . COLONOSCOPY  07/14/2019  . MAMMOGRAM  08/29/2019  . INFLUENZA VACCINE  11/02/2019  . COVID-19 Vaccine (2 - Pfizer 2-dose series) 12/25/2019    Colorectal cancer screening: Completed 07/14/2014. Repeat every 5 years Mammogram status: Completed june 2021. Repeat every year Bone Density status: Completed june 2021. Results reflect: Bone density results: OSTEOPENIA. Repeat every 3 years. (followed by Og-Gyn)  Lung Cancer Screening: (Low Dose CT Chest recommended if Age 59-80 years, 30 pack-year currently smoking OR have quit w/in 15years.) does not qualify.   Lung Cancer Screening Referral:   Additional Screening:  Hepatitis C Screening: does qualify; patient declined  Vision Screening: Recommended annual ophthalmology exams for early detection of glaucoma and other disorders of the eye. Is the patient up to date with their annual eye exam?  Yes  Who is the provider or what is the name of the office in which the patient attends annual eye exams? Dr. Valetta Close If pt is not established with a provider, would they like to be referred to a provider to establish care? No .   Dental Screening: Recommended annual dental exams for proper oral hygiene  Community Resource Referral / Chronic Care Management: CRR required this visit?  No   CCM required this visit?  No      Plan:  -Continue to follow up with various specialists including cardiology and Ob-Gyn. Complete 6 month of non driving as advised by Neurology. -Stay well hydrated. -Follow up in 1 year for [facility] and [facility] or sooner if needed.  -Placed gastroenterology referral for colonoscopy.  -Schedule lab visit for [place] within next few weeks  I have personally reviewed and noted the following in the patient's chart:    . Medical and social history . Use of alcohol, tobacco or illicit drugs  . Current medications and supplements . Functional ability and status . Nutritional status . Physical activity . Advanced directives . List of other physicians . Hospitalizations, surgeries, and ER visits in previous 12 months . Vitals . Screenings to include cognitive, depression, and falls . Referrals and appointments  In addition, I have reviewed and discussed with patient certain preventive protocols, quality metrics, and best practice recommendations. A written personalized care plan for preventive services as well as general preventive health recommendations were provided to patient.

## 2020-01-08 NOTE — Addendum Note (Signed)
Addended by: Lorrene Reid on: 01/08/2020 03:32 PM   Modules accepted: Level of Service

## 2020-01-12 NOTE — Progress Notes (Signed)
Remote pacemaker transmission.   

## 2020-02-05 DIAGNOSIS — F4323 Adjustment disorder with mixed anxiety and depressed mood: Secondary | ICD-10-CM | POA: Diagnosis not present

## 2020-02-09 ENCOUNTER — Ambulatory Visit (INDEPENDENT_AMBULATORY_CARE_PROVIDER_SITE_OTHER): Payer: PPO | Admitting: Podiatry

## 2020-02-09 ENCOUNTER — Ambulatory Visit (INDEPENDENT_AMBULATORY_CARE_PROVIDER_SITE_OTHER): Payer: PPO

## 2020-02-09 ENCOUNTER — Other Ambulatory Visit: Payer: Self-pay

## 2020-02-09 DIAGNOSIS — M21611 Bunion of right foot: Secondary | ICD-10-CM

## 2020-02-09 DIAGNOSIS — Z9889 Other specified postprocedural states: Secondary | ICD-10-CM

## 2020-02-09 DIAGNOSIS — M2041 Other hammer toe(s) (acquired), right foot: Secondary | ICD-10-CM

## 2020-02-17 NOTE — Progress Notes (Signed)
   Subjective:  Patient presents today status post bunionectomy and hammertoe repair second digit right foot. DOS: 11/13/2019.  Patient states that she is doing well.  There is significant improvement.  She continues to wear the compression socks and shoe.  Overall she is doing much better.  No new complaints at this time  Past Medical History:  Diagnosis Date  . Allergic rhinitis   . Asthmatic bronchitis   . Complete heart block ([place])   . GERD (gastroesophageal reflux disease)   . MRSA (methicillin resistant Staphylococcus aureus) 2013   left buttock  . Nonsustained ventricular tachycardia ([place])   . 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 Vaginitis   . Vocal cord paresis       Objective/Physical Exam Neurovascular status intact.  Skin incisions appear to be well coapted and healed. No sign of infectious process noted. No dehiscence. No active bleeding noted.  Minimal edema noted to the surgical extremity.  There is some limited range of motion to the first and second MTPJ.  No pain with palpation or range of motion noted  Radiographic Exam taken 01/07/2020:  Orthopedic hardware and osteotomies sites appear to be stable with routine healing.  There is some dorsal displacement of the osteotomy site seen on lateral view however the orthopedic screws appear to be intact and stable.  No movement noted.  The first ray is in good rectus alignment.  No significant change since last x-rays taken  Assessment: 1. s/p bunionectomy with first metatarsal osteotomy and hammertoe repair second digit right foot. DOS: 11/13/2019   Plan of Care:  1. Patient was evaluated.  2.  Patient may discontinue the postsurgical shoe and wear good supportive sneakers.  Slowly return to full activity no restrictions 3.  Return to clinic as needed  *Going to [place] at the end of this month for a mission trip.  Goes by [employer].  Gave a burp rag present for Valarie Cones,  DPM [facility]  Dr. Edrick Kins, [address]                                        [place], [place] [address]                Office [phone]  Fax [phone]

## 2020-03-30 ENCOUNTER — Other Ambulatory Visit: Payer: Self-pay | Admitting: Cardiovascular Disease

## 2020-03-30 ENCOUNTER — Telehealth: Payer: Self-pay

## 2020-03-30 NOTE — Telephone Encounter (Signed)
Patient called and left a message. She had bunion surgery in August 2021 and was released in November 2021. She continues to have swelling and now some redness in that surgery foot. Is this concerning? Please call to discuss and advise

## 2020-04-07 ENCOUNTER — Ambulatory Visit (INDEPENDENT_AMBULATORY_CARE_PROVIDER_SITE_OTHER): Payer: PPO

## 2020-04-07 ENCOUNTER — Telehealth: Payer: Self-pay | Admitting: Emergency Medicine

## 2020-04-07 DIAGNOSIS — I442 Atrioventricular block, complete: Secondary | ICD-10-CM | POA: Diagnosis not present

## 2020-04-07 LAB — CUP PACEART REMOTE DEVICE CHECK
Battery Impedance: 5938 Ohm
Battery Remaining Longevity: 1 mo
Battery Voltage: 2.59 V
Brady Statistic AP VP Percent: 89 %
Brady Statistic AP VS Percent: 0 %
Brady Statistic AS VP Percent: 10 %
Brady Statistic AS VS Percent: 0 %
Date Time Interrogation Session: 20220105095839
Implantable Lead Implant Date: 20100505
Implantable Lead Implant Date: 20100505
Implantable Lead Location: 753859
Implantable Lead Location: 753860
Implantable Lead Model: 5076
Implantable Lead Model: 5076
Implantable Pulse Generator Implant Date: 20100505
Lead Channel Impedance Value: 453 Ohm
Lead Channel Impedance Value: 644 Ohm
Lead Channel Pacing Threshold Amplitude: 0.625 V
Lead Channel Pacing Threshold Amplitude: 0.625 V
Lead Channel Pacing Threshold Pulse Width: 0.4 ms
Lead Channel Pacing Threshold Pulse Width: 0.4 ms
Lead Channel Setting Pacing Amplitude: 2 V
Lead Channel Setting Pacing Amplitude: 2.5 V
Lead Channel Setting Pacing Pulse Width: 0.4 ms
Lead Channel Setting Sensing Sensitivity: 2 mV

## 2020-04-07 NOTE — Telephone Encounter (Signed)
<   1 month to ERI. Home remotes scheduled every 2 weeks and patient aware of dates.

## 2020-04-08 ENCOUNTER — Telehealth: Payer: Self-pay

## 2020-04-08 DIAGNOSIS — H2513 Age-related nuclear cataract, bilateral: Secondary | ICD-10-CM | POA: Diagnosis not present

## 2020-04-08 DIAGNOSIS — H5213 Myopia, bilateral: Secondary | ICD-10-CM | POA: Diagnosis not present

## 2020-04-08 NOTE — Telephone Encounter (Signed)
Patient called and left a message back in December 2021. Still hasn't heard anything. She had bunion surgery in August 2021.Marland Kitchen She continues to have swelling and problems with her foot. I will get this pt surgery for an appointment.

## 2020-04-09 NOTE — Telephone Encounter (Signed)
Pt is scheduled °

## 2020-04-10 ENCOUNTER — Other Ambulatory Visit: Payer: Self-pay | Admitting: Cardiovascular Disease

## 2020-04-12 ENCOUNTER — Ambulatory Visit (INDEPENDENT_AMBULATORY_CARE_PROVIDER_SITE_OTHER): Payer: PPO

## 2020-04-12 ENCOUNTER — Other Ambulatory Visit: Payer: Self-pay

## 2020-04-12 ENCOUNTER — Ambulatory Visit (INDEPENDENT_AMBULATORY_CARE_PROVIDER_SITE_OTHER): Payer: PPO | Admitting: Podiatry

## 2020-04-12 DIAGNOSIS — M21611 Bunion of right foot: Secondary | ICD-10-CM | POA: Diagnosis not present

## 2020-04-12 DIAGNOSIS — G5791 Unspecified mononeuropathy of right lower limb: Secondary | ICD-10-CM | POA: Diagnosis not present

## 2020-04-12 MED ORDER — GABAPENTIN 100 MG PO CAPS: 100.0000 mg | ORAL_CAPSULE | Freq: Three times a day (TID) | ORAL | 0 refills | Status: DC

## 2020-04-12 NOTE — Progress Notes (Signed)
   Subjective:  Patient presents today for evaluation of constricting with pain and pins-and-needles and numbness to the forefoot of the right foot.  Patient does have a history of bunionectomy and hammertoe repair second digit right foot. DOS: 11/13/2019.  Patient states that the surgical area feels well however she has some constricting sharp stabbing pins-and-needles to the forefoot.  This been ongoing for the last 2 months now.  She presents for further treatment and evaluation  Past Medical History:  Diagnosis Date  . Allergic rhinitis   . Asthmatic bronchitis   . Complete heart block ([place])   . GERD (gastroesophageal reflux disease)   . MRSA (methicillin resistant Staphylococcus aureus) 2013   left buttock  . Nonsustained ventricular tachycardia ([place])   . 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 Vaginitis   . Vocal cord paresis     Objective: Physical Exam General: The patient is alert and oriented x3 in no acute distress.  Dermatology: Skin is cool, dry and supple bilateral lower extremities. Negative for open lesions or macerations.  Vascular: Palpable pedal pulses bilaterally. No edema or erythema noted. Capillary refill within normal limits.  Neurological: Epicritic and protective threshold grossly intact bilaterally.  There is some sharp shooting paresthesia with light touch around the forefoot.  Musculoskeletal Exam: All pedal and ankle joints range of motion within normal limits bilateral. Muscle strength 5/5 in all groups bilateral.   Radiographic Exam:  Orthopedic hardware and osteotomies sites appear to be stable with routine healing.  Orthopedic hardware is in good alignment.  No movement noted.  First ray is in good alignment.  Joint spaces preserved.  No new fractures identified.  Assessment: 1. H/0 bunionectomy with first metatarsal osteotomy and hammertoe repair second digit right foot. DOS: 11/13/2019 2.  Neuritis right  forefoot   Plan of Care:  1. Patient was evaluated.  2. continue wearing good supportive shoes that do not constrict the toebox 3.  Prescription for gabapentin 100 mg 3 times daily 4.  Return to clinic in 4 weeks  *Goes by Brandy [name], DPM [facility]  Dr. Edrick Kins, [address]                                        [place], [place] [address]                Office [phone]  Fax [phone]

## 2020-04-22 ENCOUNTER — Other Ambulatory Visit: Payer: Self-pay

## 2020-04-22 ENCOUNTER — Ambulatory Visit (INDEPENDENT_AMBULATORY_CARE_PROVIDER_SITE_OTHER): Payer: PPO | Admitting: Cardiovascular Disease

## 2020-04-22 ENCOUNTER — Other Ambulatory Visit (HOSPITAL_COMMUNITY)
Admission: RE | Admit: 2020-04-22 | Discharge: 2020-04-22 | Disposition: A | Discharge status: Home / Self Care | Payer: PPO | Source: Ambulatory Visit | Attending: Cardiovascular Disease | Admitting: Cardiovascular Disease

## 2020-04-22 ENCOUNTER — Telehealth: Payer: Self-pay | Admitting: Cardiovascular Disease

## 2020-04-22 ENCOUNTER — Encounter: Payer: Self-pay | Admitting: Cardiovascular Disease

## 2020-04-22 VITALS — BP 132/72 | HR 68 | Ht 67.0 in | Wt 151.0 lb

## 2020-04-22 DIAGNOSIS — I97190 Other postprocedural cardiac functional disturbances following cardiac surgery: Secondary | ICD-10-CM

## 2020-04-22 DIAGNOSIS — Z01812 Encounter for preprocedural laboratory examination: Secondary | ICD-10-CM | POA: Insufficient documentation

## 2020-04-22 DIAGNOSIS — I442 Atrioventricular block, complete: Secondary | ICD-10-CM

## 2020-04-22 DIAGNOSIS — Z7901 Long term (current) use of anticoagulants: Secondary | ICD-10-CM | POA: Diagnosis not present

## 2020-04-22 DIAGNOSIS — Z45010 Encounter for checking and testing of cardiac pacemaker pulse generator [battery]: Secondary | ICD-10-CM

## 2020-04-22 DIAGNOSIS — Z20822 Contact with and (suspected) exposure to covid-19: Secondary | ICD-10-CM | POA: Diagnosis not present

## 2020-04-22 DIAGNOSIS — I480 Paroxysmal atrial fibrillation: Secondary | ICD-10-CM | POA: Diagnosis not present

## 2020-04-22 DIAGNOSIS — I472 Ventricular tachycardia: Secondary | ICD-10-CM

## 2020-04-22 DIAGNOSIS — I4729 Other ventricular tachycardia: Secondary | ICD-10-CM

## 2020-04-22 LAB — PACEMAKER DEVICE OBSERVATION

## 2020-04-22 NOTE — H&P (View-Only) (Signed)
Patient ID: Brandy [name], female   DOB: [birth year], 65 y.o.   MRN: [id number]     Cardiology Office Note    Date:  04/22/2020   ID:  [name], [name] [birth year], MRN [id number]  PCP:  Lorrene Reid, PA-C  Cardiologist:   Sanda Klein, MD   Chief Complaint  Patient presents with  . Edema  . Chest Pain  . Headache  . Fatigue    History of Present Illness:  Brandy [name] is a 65 y.o. female with complete heart block who presents for complaints of dizziness and fatigue. Episodes of asymptomatic ventricular tachycardia (nonsustained) and paroxysmal atrial fibrillation that have been detected by her device.  Called today: having episodes of dizziness.  She reports this happens when she stands up or changes positions. When she stands still it, the dizziness resolves.  She states this has been happening x 3 weeks but is increasing in frequency.  No syncopal episodes.     Had several discomfort (really bad ache) between her shoulder blades while walking her dog 2 days ago.  Interrogation of her pacemaker today shows that the device reached ERI on January 18, coinciding with the onset of the above complaints.  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  Her device cannot be reprogrammed back to dual-chamber mode.  Brandy [name] had a syncopal event on May 20.  She was hanging out close.  She has no recollection of losing consciousness but apparently fell face first and injured herself.  Unclear how long she lost consciousness, but when she came to she went into the house and called her daughter.  When her daughter arrived, Brandy [name] was oriented to herself and to location but thought that "Brandy [name]" was president.  They subsequently went to the emergency room where she had worsening confusion over a period of several hours.  Apparently there was evidence of a small bites to the posterior part of her tongue, but she did not have  sphincter tone loss.  She was asking the same question repeatedly and immediately forget what the circumstances were.  She apparently did not fully recover to normal until the next day.  She still has amnesia for almost all of the events of that day.  The next day she had documented orthostatic hypotension with her systolic blood pressure dropping from 150 down to 70. She was noted to have stenosis of the vertebral arteries on CT angiography and atorvastatin was also started.  Aspirin was not started at this point, since she was newly on oral anticoagulation.  No driving was recommended for 6 months.  She has a follow-up scheduled with Dr. Asencion Partridge Dohmeier tomorrow.  We have been able to perform an MRI since then and no major abnormalities were found.  (Her leads are pacemaker conditional although her generator is a non-MRI Medtronic Adapta).  There were no major problems during the MRI.  LV EF is 50-55%, slightly low likely due to RV pacing.  No valvular abnormalities were seen.  She has a history of normal coronary arteries and a calcium score of 0 and a CT angiogram performed in July 2019.   Past Medical History:  Diagnosis Date  . Allergic rhinitis   . Asthmatic bronchitis   . Complete heart block ([place])   . GERD (gastroesophageal reflux disease)   . MRSA (methicillin resistant Staphylococcus aureus) 2013   left buttock  . Nonsustained ventricular tachycardia ([place])   .  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 Vaginitis   . Vocal cord paresis     Past Surgical History:  Procedure Laterality Date  . ACROMIO-CLAVICULAR JOINT REPAIR Left 12/10/2014   Procedure: LEFT CORACOCLAVICULAR RECONSTRUCTION WITH  ZIPLOOP;  Surgeon: Tania Ade, MD;  Location: [facility];  Service: Orthopedics;  Laterality: Left;  . APPENDECTOMY  05-2010  . COLON SURGERY N/A    Phreesia 01/06/2020  . DILATION AND CURETTAGE OF UTERUS  1978  . FRACTURE SURGERY N/A    Phreesia  01/06/2020  . HAND SURGERY    . PACEMAKER INSERTION  08/05/08   Medtronic Adapta  . Vocal cord splint  2005    Outpatient Medications Prior to Visit  Medication Sig Dispense Refill  . ALPRAZolam (XANAX) 0.25 MG tablet Take 0.25 mg by mouth at bedtime as needed for sleep.     Marland Kitchen atorvastatin (LIPITOR) 20 MG tablet TAKE 1 TABLET BY MOUTH DAILY (Patient taking differently: Take 20 mg by mouth daily.) 30 tablet 0  . Calcium Carbonate-Vitamin D 500-125 MG-UNIT TABS Take 1 tablet by mouth daily.    Marland Kitchen FLUoxetine (PROZAC) 20 MG capsule Take 20 mg by mouth daily.     . fluticasone (FLONASE) 50 MCG/ACT nasal spray USE 1 SPRAY IN EACH NOSTRIL DAILY (Patient taking differently: Place 1 spray into both nostrils daily. USE 1 SPRAY IN EACH NOSTRIL DAILY) 16 g 5  . gabapentin (NEURONTIN) 100 MG capsule Take 1 capsule (100 mg total) by mouth 3 (three) times daily. 90 capsule 0  . metoprolol succinate (TOPROL-XL) 50 MG 24 hr tablet TAKE 1 TABLET BY MOUTH DAILY WITH OR IMMEDIATELY FOLLOWING A MEAL (Patient taking differently: Take 50 mg by mouth daily.) 90 tablet 1  . valACYclovir (VALTREX) 1000 MG tablet Take 1 tablet by mouth daily as needed (cold sores).     [name] 20 MG TABS tablet TAKE 1 TABLET BY MOUTH DAILY WITH SUPPER (Patient taking differently: Take 20 mg by mouth daily with supper.) 90 tablet 1   No facility-administered medications prior to visit.     Allergies:   Erythromycin and Sulfa antibiotics   Social History   Socioeconomic History  . Marital status: Widowed    Spouse name: Not on file  . Number of children: Not on file  . Years of education: Not on file  . Highest education level: Not on file  Occupational History  . Not on file  Tobacco Use  . Smoking status: Never Smoker  . Smokeless tobacco: Never Used  Vaping Use  . Vaping Use: Never used  Substance and Sexual Activity  . Alcohol use: Not Currently  . Drug use: Never  . Sexual activity: Not Currently    Birth  control/protection: None  Other Topics Concern  . Not on file  Social History Narrative  . Not on file   Social Determinants of Health   Financial Resource Strain: Not on file  Food Insecurity: Not on file  Transportation Needs: Not on file  Physical Activity: Not on file  Stress: Not on file  Social Connections: Not on file     Family History:  The patient's family history includes Alcohol abuse in her paternal grandfather and sister; Cancer in her maternal grandmother; Coronary artery disease in her mother; Depression in her father, mother, and sister; Diabetes in her father, maternal grandfather, and paternal uncle; Heart disease in her father, maternal grandfather, mother, and paternal grandfather; Hypertension in her father,  mother, and sister.   ROS:   Please see the history of present illness.    ROS All other systems are reviewed and are negative.   PHYSICAL EXAM:   VS:  BP 132/72 (BP Location: Right Arm, Patient Position: Sitting, Cuff Size: Normal)   Pulse 68   Ht 5\' 7"  (1.702 m)   Wt 151 lb (68.5 kg)   BMI 23.65 kg/m     General: Alert, oriented x3, no distress, Healthy subclavian pacemaker site Head: no evidence of trauma, PERRL, EOMI, no exophtalmos or lid lag, no myxedema, no xanthelasma; normal ears, nose and oropharynx Neck: Cannon, A waves are present; brisk carotid pulses without delay and no carotid bruits Chest: clear to auscultation, no signs of consolidation by percussion or palpation, normal fremitus, symmetrical and full respiratory excursions Cardiovascular: normal position and quality of the apical impulse, regular rhythm, normal first and second heart sounds, no murmurs, rubs or gallops Abdomen: no tenderness or distention, no masses by palpation, no abnormal pulsatility or arterial bruits, normal bowel sounds, no hepatosplenomegaly Extremities: no clubbing, cyanosis or edema; 2+ radial, ulnar and brachial pulses bilaterally; 2+ right femoral, posterior  tibial and dorsalis pedis pulses; 2+ left femoral, posterior tibial and dorsalis pedis pulses; no subclavian or femoral bruits Neurological: grossly nonfocal Psych: Normal mood and affect   Wt Readings from Last 3 Encounters:  04/22/20 151 lb (68.5 kg)  01/08/20 144 lb (65.3 kg)  12/09/19 144 lb (65.3 kg)      Studies/Labs Reviewed:  CT angiogram May 2021  IMPRESSION: 1. Atherosclerosis is mild for age in the neck, with no extracranial carotid or vertebral artery stenosis. Mild generalized arterial tortuosity.  2. Bilateral proximal subclavian artery plaque without significant stenosis. 3. Cervical spine and TMJ degeneration.  MRI of brain 10/28/2019  IMPRESSION: No acute or subacute finding. Chronic small-vessel ischemic changes affecting the brain, advanced in the pons and moderate in the cerebral hemispheric white matter.   EKG:  EKG is not ordered today shows asynchronous ventricular pacing at 65 bpm with A-V dissociation  Recent Labs: 08/21/2019: TSH 2.370 08/22/2019: ALT 14; BUN 19; Creatinine, Ser 0.92; Hemoglobin 12.9; Platelets 209; Potassium 3.7; Sodium 140   Lipid Panel    Component Value Date/Time   CHOL 223 (H) 08/21/2019 2100   CHOL 215 (H) 03/28/2018 0858   TRIG 55 08/21/2019 2100   HDL 71 08/21/2019 2100   HDL 61 03/28/2018 0858   CHOLHDL 3.1 08/21/2019 2100   VLDL 11 08/21/2019 2100   LDLCALC 141 (H) 08/21/2019 2100   LDLCALC 134 (H) 03/28/2018 0858      ASSESSMENT:    1. Pacemaker syndrome   2. NSVT (nonsustained ventricular tachycardia) ([place])   3. CHB (complete heart block) ([facility])   4. Pacemaker battery depletion   5. Paroxysmal atrial fibrillation ([facility])   6. Long term (current) use of anticoagulants      PLAN:  In order of problems listed above:  1. Pacemaker syndrome: Pretty much all her recent symptoms can be readily explained by asynchronous ventricular pacing and should improve with generator change out. This procedure has  been fully reviewed with the patient and written informed consent has been obtained.  In particular, reviewed the risk of infection. 2. NSVT: Infrequent.  Only 2 recorded since last device check.  Consistently asymptomatic. 3. CHB: She is pacemaker dependent.  There is no underlying escape rhythm.  She will benefit from placement of an Aegis pouch to reduce the risk of device infection,  which could have devastating consequences and a pacemaker dependent patient. 4. PPM: Device has reached ERI little faster than anticipated.  With her new generator her system will be fully MRI compatible 5. AFib: The burden of arrhythmia is very low, but CHA2DS2-VASc score is 2 (gender and age).  Episodes are asymptomatic.  She is anticoagulated. 6. Anticoagulation: Hold Xarelto the night before the pacemaker change of procedure. 7. HLP: On statin, Ideal LDL target under 70.    Medication Adjustments/Labs and Tests Ordered: Current medicines are reviewed at length with the patient today.  Concerns regarding medicines are outlined above.  Medication changes, Labs and Tests ordered today are listed in the Patient Instructions below. Patient Instructions  Medication Instructions: Your physician recommends that you continue on your current medications as directed. Please refer to the Current Medication list given to you today.  * If you need a refill on your cardiac medications before your next appointment, please call your pharmacy. *  Labwork: Pre procedure lab work today: BMET & CBC  * Will notify you of abnormal results, otherwise continue current treatment plan.*  Testing/Procedures: Your physician has recommended that you have a pacemaker/defibrillator generator change (battery change). Please follow the instructions below, located under the special instructions section.  Follow-Up: Your physician recommends that you schedule a wound check appointment 10-14 days, after your procedure on 04/26/20, with the  device clinic.  Your physician recommends that you schedule a follow up appointment in 91 days, after your procedure on 04/26/20, with Dr. Sallyanne Kuster.  Thank you for choosing [facility]!!      Any Other Special Instructions Will Be Listed Below (If Applicable).    [facility], [place]  [place], [place] [address]  Phone: [phone] Fax: [phone]    Generator Change Procedure Instructions  You are scheduled for a Generator Change (battery change) on  04/26/20  with Dr. Sallyanne Kuster.  1. Please arrive at the [facility], Entrance "A"  at [facility] at  7 am on the day of your procedure. (The address is [address])  2. DIET: You may have a light, early breakfast the morning of your procedure. NOTHING TO EAT AFTER 8:00 AM.  3. LABS:   You will need to have the coronavirus test completed prior to your procedure. An appointment has been made at 2:50 pm on 04/26/20. This is a Drive Up Visit at [phone], [place], [place] [address]. Please tell them that you are there for procedure testing. Stay in your car and someone will be with you shortly. Please make sure to have all other labs completed before this test because you will need to stay quarantined until your procedure.   4. MEDICATIONS: Hold the Xarelto Sunday night the 23rd.  5.  Plan for an overnight stay.  Bring your insurance cards and a list of you medications.  6.  Wash your chest and neck with surgical scrub the evening before and the morning of your procedure.  Rinse well. Please review the surgical scrub instruction sheet given to you.   7. Your chest will need to be shaved prior to this procedure (if needed). We ask that you do this yourself at home 1 to 2 days before or if uncomfortable/unable to do yourself, then it will be performed by the hospital staff the day of.  * Special note:  Every effort is made to have your procedure done  on time.  Occasionally  there are emergencies that present themselves at the hospital that may cause delays.  Please be patient if a delay does occur.                                                                                                           * If you have any questions after you get home, please call Lattie Haw, RN at [phone].    [facility] - Preparing For Surgery  Before surgery, you can play an important role. Because skin is not sterile, your skin needs to be as free of germs as possible. You can reduce the number of germs on your skin by washing with CHG (chlorahexidine gluconate) Soap before surgery.  CHG is an antiseptic cleaner which kills germs and bonds with the skin to continue killing germs even after washing.   Please do not use if you have an allergy to CHG or antibacterial soaps.  If your skin becomes reddened/irritated stop using the CHG.   Do not shave (including legs and underarms) for at least 48 hours prior to first CHG shower.  It is OK to shave your face.  Please follow these instructions carefully:  1.  Shower the night before surgery and the morning of surgery with CHG.  2.  If you choose to wash your hair, wash your hair first as usual with your normal shampoo.  3.  After you shampoo, rinse your hair and body thoroughly to remove the shampoo.  4.  Use CHG as you would any other liquid soap.  You can apply CHG directly to the skin and wash gently with a clean washcloth. 5.  Apply the CHG Soap to your body ONLY FROM THE NECK DOWN.  Do not use on open wounds or open sores.  Avoid contact with your eyes, ears, mouth and genitals (private parts).    6.  Wash thoroughly, paying special attention to the area where your surgery will be performed.  7.  Thoroughly rinse your body with warm water from the neck down.   8.  DO NOT shower/wash with your normal soap after using and rinsing off the CHG soap.  9.  Pat yourself dry with a clean towel.   10.  Wear clean  pajamas.   11.  Place clean sheets on your bed the night of your first shower and do not sleep with pets.  Day of Surgery: Do not apply any deodorants/lotions.  Please wear clean clothes to the hospital/surgery center.         Signed, Sanda Klein, MD  04/22/2020 5:26 PM    [facility] [place], [place], [place]  [address] Phone: [phone]; Fax: [phone]

## 2020-04-22 NOTE — Progress Notes (Signed)
Remote pacemaker transmission.   

## 2020-04-22 NOTE — Telephone Encounter (Signed)
Returned call to patient to discuss-patient states she has been having episodes of dizziness.  She reports this happens when she stands up or changes positions. When she stands still it, the dizziness resolves.  She states this has been happening x 3 weeks but is increasing in frequency.   Unsure what her blood pressure is, no way to check at home.   She is eating and drinking okay.  No syncopal episodes.     She also reports she was out walking her dog 2 days ago and started having a "really bad ache" in between her shoulder blades, radiated up her neck and her chest felt "tight".    She has never had this pain before.   She went inside and pain resolved with rest.   She was very fatigued after this.   Some SOB with episode, no N/V/D.   Did not use NTG, no hx of CAD  No episodes since.   She also reports her PPM being ERI.  She thought with her symptoms she would call to inform Dr. Sallyanne Kuster.  Patient states she doesn't "feel bad", "just funny".      Hx: syncope, NSVT, CHB, PPM, Afib, HLD  Last transmission 1/5, will reach out to device clinic to obtain transmission prior to appt.     Scheduled to see Dr. Sallyanne Kuster today at 1:20 PM for further assessment.  Aware if symptoms worsen or return to proceed to ER for evaluation.   Patient verbalized understanding.

## 2020-04-22 NOTE — Progress Notes (Signed)
Patient ID: Brandy [name], female   DOB: [birth year], 65 y.o.   MRN: [id number]     Cardiology Office Note    Date:  04/22/2020   ID:  Brandy [name], Brandy [name] [birth year], MRN [id number]  PCP:  Lorrene Reid, PA-C  Cardiologist:   Sanda Klein, MD   Chief Complaint  Patient presents with  . Edema  . Chest Pain  . Headache  . Fatigue    History of Present Illness:  Brandy [name] is a 65 y.o. female with complete heart block who presents for complaints of dizziness and fatigue. Episodes of asymptomatic ventricular tachycardia (nonsustained) and paroxysmal atrial fibrillation that have been detected by her device.  Called today: having episodes of dizziness.  She reports this happens when she stands up or changes positions. When she stands still it, the dizziness resolves.  She states this has been happening x 3 weeks but is increasing in frequency.  No syncopal episodes.     Had several discomfort (really bad ache) between her shoulder blades while walking her dog 2 days ago.  Interrogation of her pacemaker today shows that the device reached ERI on January 18, coinciding with the onset of the above complaints.  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  Her device cannot be reprogrammed back to dual-chamber mode.  Brandy [name] had a syncopal event on May 20.  She was hanging out close.  She has no recollection of losing consciousness but apparently fell face first and injured herself.  Unclear how long she lost consciousness, but when she came to she went into the house and called her daughter.  When her daughter arrived, Brandy [name] was oriented to herself and to location but thought that "Brandy [name]" was president.  They subsequently went to the emergency room where she had worsening confusion over a period of several hours.  Apparently there was evidence of a small bites to the posterior part of her tongue, but she did not have  sphincter tone loss.  She was asking the same question repeatedly and immediately forget what the circumstances were.  She apparently did not fully recover to normal until the next day.  She still has amnesia for almost all of the events of that day.  The next day she had documented orthostatic hypotension with her systolic blood pressure dropping from 150 down to 70. She was noted to have stenosis of the vertebral arteries on CT angiography and atorvastatin was also started.  Aspirin was not started at this point, since she was newly on oral anticoagulation.  No driving was recommended for 6 months.  She has a follow-up scheduled with Dr. Asencion Partridge Dohmeier tomorrow.  We have been able to perform an MRI since then and no major abnormalities were found.  (Her leads are pacemaker conditional although her generator is a non-MRI Medtronic Adapta).  There were no major problems during the MRI.  LV EF is 50-55%, slightly low likely due to RV pacing.  No valvular abnormalities were seen.  She has a history of normal coronary arteries and a calcium score of 0 and a CT angiogram performed in July 2019.   Past Medical History:  Diagnosis Date  . Allergic rhinitis   . Asthmatic bronchitis   . Complete heart block ([place])   . GERD (gastroesophageal reflux disease)   . MRSA (methicillin resistant Staphylococcus aureus) 2013   left buttock  . Nonsustained ventricular tachycardia ([place])   .  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 Vaginitis   . Vocal cord paresis     Past Surgical History:  Procedure Laterality Date  . ACROMIO-CLAVICULAR JOINT REPAIR Left 12/10/2014   Procedure: LEFT CORACOCLAVICULAR RECONSTRUCTION WITH  ZIPLOOP;  Surgeon: Tania Ade, MD;  Location: [facility];  Service: Orthopedics;  Laterality: Left;  . APPENDECTOMY  05-2010  . COLON SURGERY N/A    Phreesia 01/06/2020  . DILATION AND CURETTAGE OF UTERUS  1978  . FRACTURE SURGERY N/A    Phreesia  01/06/2020  . HAND SURGERY    . PACEMAKER INSERTION  08/05/08   Medtronic Adapta  . Vocal cord splint  2005    Outpatient Medications Prior to Visit  Medication Sig Dispense Refill  . ALPRAZolam (XANAX) 0.25 MG tablet Take 0.25 mg by mouth at bedtime as needed for sleep.     Marland Kitchen atorvastatin (LIPITOR) 20 MG tablet TAKE 1 TABLET BY MOUTH DAILY (Patient taking differently: Take 20 mg by mouth daily.) 30 tablet 0  . Calcium Carbonate-Vitamin D 500-125 MG-UNIT TABS Take 1 tablet by mouth daily.    Marland Kitchen FLUoxetine (PROZAC) 20 MG capsule Take 20 mg by mouth daily.     . fluticasone (FLONASE) 50 MCG/ACT nasal spray USE 1 SPRAY IN EACH NOSTRIL DAILY (Patient taking differently: Place 1 spray into both nostrils daily. USE 1 SPRAY IN EACH NOSTRIL DAILY) 16 g 5  . gabapentin (NEURONTIN) 100 MG capsule Take 1 capsule (100 mg total) by mouth 3 (three) times daily. 90 capsule 0  . metoprolol succinate (TOPROL-XL) 50 MG 24 hr tablet TAKE 1 TABLET BY MOUTH DAILY WITH OR IMMEDIATELY FOLLOWING A MEAL (Patient taking differently: Take 50 mg by mouth daily.) 90 tablet 1  . valACYclovir (VALTREX) 1000 MG tablet Take 1 tablet by mouth daily as needed (cold sores).     [name] 20 MG TABS tablet TAKE 1 TABLET BY MOUTH DAILY WITH SUPPER (Patient taking differently: Take 20 mg by mouth daily with supper.) 90 tablet 1   No facility-administered medications prior to visit.     Allergies:   Erythromycin and Sulfa antibiotics   Social History   Socioeconomic History  . Marital status: Widowed    Spouse name: Not on file  . Number of children: Not on file  . Years of education: Not on file  . Highest education level: Not on file  Occupational History  . Not on file  Tobacco Use  . Smoking status: Never Smoker  . Smokeless tobacco: Never Used  Vaping Use  . Vaping Use: Never used  Substance and Sexual Activity  . Alcohol use: Not Currently  . Drug use: Never  . Sexual activity: Not Currently    Birth  control/protection: None  Other Topics Concern  . Not on file  Social History Narrative  . Not on file   Social Determinants of Health   Financial Resource Strain: Not on file  Food Insecurity: Not on file  Transportation Needs: Not on file  Physical Activity: Not on file  Stress: Not on file  Social Connections: Not on file     Family History:  The patient's family history includes Alcohol abuse in her paternal grandfather and sister; Cancer in her maternal grandmother; Coronary artery disease in her mother; Depression in her father, mother, and sister; Diabetes in her father, maternal grandfather, and paternal uncle; Heart disease in her father, maternal grandfather, mother, and paternal grandfather; Hypertension in her father,  mother, and sister.   ROS:   Please see the history of present illness.    ROS All other systems are reviewed and are negative.   PHYSICAL EXAM:   VS:  BP 132/72 (BP Location: Right Arm, Patient Position: Sitting, Cuff Size: Normal)   Pulse 68   Ht 5\' 7"  (1.702 m)   Wt 151 lb (68.5 kg)   BMI 23.65 kg/m     General: Alert, oriented x3, no distress, Healthy subclavian pacemaker site Head: no evidence of trauma, PERRL, EOMI, no exophtalmos or lid lag, no myxedema, no xanthelasma; normal ears, nose and oropharynx Neck: Cannon, A waves are present; brisk carotid pulses without delay and no carotid bruits Chest: clear to auscultation, no signs of consolidation by percussion or palpation, normal fremitus, symmetrical and full respiratory excursions Cardiovascular: normal position and quality of the apical impulse, regular rhythm, normal first and second heart sounds, no murmurs, rubs or gallops Abdomen: no tenderness or distention, no masses by palpation, no abnormal pulsatility or arterial bruits, normal bowel sounds, no hepatosplenomegaly Extremities: no clubbing, cyanosis or edema; 2+ radial, ulnar and brachial pulses bilaterally; 2+ right femoral, posterior  tibial and dorsalis pedis pulses; 2+ left femoral, posterior tibial and dorsalis pedis pulses; no subclavian or femoral bruits Neurological: grossly nonfocal Psych: Normal mood and affect   Wt Readings from Last 3 Encounters:  04/22/20 151 lb (68.5 kg)  01/08/20 144 lb (65.3 kg)  12/09/19 144 lb (65.3 kg)      Studies/Labs Reviewed:  CT angiogram May 2021  IMPRESSION: 1. Atherosclerosis is mild for age in the neck, with no extracranial carotid or vertebral artery stenosis. Mild generalized arterial tortuosity.  2. Bilateral proximal subclavian artery plaque without significant stenosis. 3. Cervical spine and TMJ degeneration.  MRI of brain 10/28/2019  IMPRESSION: No acute or subacute finding. Chronic small-vessel ischemic changes affecting the brain, advanced in the pons and moderate in the cerebral hemispheric white matter.   EKG:  EKG is not ordered today shows asynchronous ventricular pacing at 65 bpm with A-V dissociation  Recent Labs: 08/21/2019: TSH 2.370 08/22/2019: ALT 14; BUN 19; Creatinine, Ser 0.92; Hemoglobin 12.9; Platelets 209; Potassium 3.7; Sodium 140   Lipid Panel    Component Value Date/Time   CHOL 223 (H) 08/21/2019 2100   CHOL 215 (H) 03/28/2018 0858   TRIG 55 08/21/2019 2100   HDL 71 08/21/2019 2100   HDL 61 03/28/2018 0858   CHOLHDL 3.1 08/21/2019 2100   VLDL 11 08/21/2019 2100   LDLCALC 141 (H) 08/21/2019 2100   LDLCALC 134 (H) 03/28/2018 0858      ASSESSMENT:    1. Pacemaker syndrome   2. NSVT (nonsustained ventricular tachycardia) ([place])   3. CHB (complete heart block) ([facility])   4. Pacemaker battery depletion   5. Paroxysmal atrial fibrillation ([facility])   6. Long term (current) use of anticoagulants      PLAN:  In order of problems listed above:  1. Pacemaker syndrome: Pretty much all her recent symptoms can be readily explained by asynchronous ventricular pacing and should improve with generator change out. This procedure has  been fully reviewed with the patient and written informed consent has been obtained.  In particular, reviewed the risk of infection. 2. NSVT: Infrequent.  Only 2 recorded since last device check.  Consistently asymptomatic. 3. CHB: She is pacemaker dependent.  There is no underlying escape rhythm.  She will benefit from placement of an Aegis pouch to reduce the risk of device infection,  which could have devastating consequences and a pacemaker dependent patient. 4. PPM: Device has reached ERI little faster than anticipated.  With her new generator her system will be fully MRI compatible 5. AFib: The burden of arrhythmia is very low, but CHA2DS2-VASc score is 2 (gender and age).  Episodes are asymptomatic.  She is anticoagulated. 6. Anticoagulation: Hold Xarelto the night before the pacemaker change of procedure. 7. HLP: On statin, Ideal LDL target under 70.    Medication Adjustments/Labs and Tests Ordered: Current medicines are reviewed at length with the patient today.  Concerns regarding medicines are outlined above.  Medication changes, Labs and Tests ordered today are listed in the Patient Instructions below. Patient Instructions  Medication Instructions: Your physician recommends that you continue on your current medications as directed. Please refer to the Current Medication list given to you today.  * If you need a refill on your cardiac medications before your next appointment, please call your pharmacy. *  Labwork: Pre procedure lab work today: BMET & CBC  * Will notify you of abnormal results, otherwise continue current treatment plan.*  Testing/Procedures: Your physician has recommended that you have a pacemaker/defibrillator generator change (battery change). Please follow the instructions below, located under the special instructions section.  Follow-Up: Your physician recommends that you schedule a wound check appointment 10-14 days, after your procedure on 04/26/20, with the  device clinic.  Your physician recommends that you schedule a follow up appointment in 91 days, after your procedure on 04/26/20, with Dr. Sallyanne Kuster.  Thank you for choosing [facility]!!      Any Other Special Instructions Will Be Listed Below (If Applicable).    [facility] at [place], [place]  [place], [place] [address]  Phone: [phone] Fax: [phone]    Generator Change Procedure Instructions  You are scheduled for a Generator Change (battery change) on  04/26/20  with Dr. Sallyanne Kuster.  1. Please arrive at the [facility], Entrance "A"  at [facility] at  7 am on the day of your procedure. (The address is [address]  2. DIET: You may have a light, early breakfast the morning of your procedure. NOTHING TO EAT AFTER 8:00 AM.  3. LABS:   You will need to have the coronavirus test completed prior to your procedure. An appointment has been made at 2:50 pm on 04/26/20. This is a Drive Up Visit at [id number], [place], [place] [address]. Please tell them that you are there for procedure testing. Stay in your car and someone will be with you shortly. Please make sure to have all other labs completed before this test because you will need to stay quarantined until your procedure.   4. MEDICATIONS: Hold the Xarelto Sunday night the 23rd.  5.  Plan for an overnight stay.  Bring your insurance cards and a list of you medications.  6.  Wash your chest and neck with surgical scrub the evening before and the morning of your procedure.  Rinse well. Please review the surgical scrub instruction sheet given to you.   7. Your chest will need to be shaved prior to this procedure (if needed). We ask that you do this yourself at home 1 to 2 days before or if uncomfortable/unable to do yourself, then it will be performed by the hospital staff the day of.  * Special note:  Every effort is made to have your procedure done  on time.  Occasionally  there are emergencies that present themselves at the hospital that may cause delays.  Please be patient if a delay does occur.                                                                                                           * If you have any questions after you get home, please call Lattie Haw, RN at [phone].    [facility] - Preparing For Surgery  Before surgery, you can play an important role. Because skin is not sterile, your skin needs to be as free of germs as possible. You can reduce the number of germs on your skin by washing with CHG (chlorahexidine gluconate) Soap before surgery.  CHG is an antiseptic cleaner which kills germs and bonds with the skin to continue killing germs even after washing.   Please do not use if you have an allergy to CHG or antibacterial soaps.  If your skin becomes reddened/irritated stop using the CHG.   Do not shave (including legs and underarms) for at least 48 hours prior to first CHG shower.  It is OK to shave your face.  Please follow these instructions carefully:  1.  Shower the night before surgery and the morning of surgery with CHG.  2.  If you choose to wash your hair, wash your hair first as usual with your normal shampoo.  3.  After you shampoo, rinse your hair and body thoroughly to remove the shampoo.  4.  Use CHG as you would any other liquid soap.  You can apply CHG directly to the skin and wash gently with a clean washcloth. 5.  Apply the CHG Soap to your body ONLY FROM THE NECK DOWN.  Do not use on open wounds or open sores.  Avoid contact with your eyes, ears, mouth and genitals (private parts).    6.  Wash thoroughly, paying special attention to the area where your surgery will be performed.  7.  Thoroughly rinse your body with warm water from the neck down.   8.  DO NOT shower/wash with your normal soap after using and rinsing off the CHG soap.  9.  Pat yourself dry with a clean towel.   10.  Wear clean  pajamas.   11.  Place clean sheets on your bed the night of your first shower and do not sleep with pets.  Day of Surgery: Do not apply any deodorants/lotions.  Please wear clean clothes to the hospital/surgery center.         Signed, Sanda Klein, MD  04/22/2020 5:26 PM    [place], [place], [place]  [address] Phone: [phone]; Fax: [phone]

## 2020-04-22 NOTE — Patient Instructions (Signed)
Medication Instructions: Your physician recommends that you continue on your current medications as directed. Please refer to the Current Medication list given to you today.  * If you need a refill on your cardiac medications before your next appointment, please call your pharmacy. *  Labwork: Pre procedure lab work today: BMET & CBC  * Will notify you of abnormal results, otherwise continue current treatment plan.*  Testing/Procedures: Your physician has recommended that you have a pacemaker/defibrillator generator change (battery change). Please follow the instructions below, located under the special instructions section.  Follow-Up: Your physician recommends that you schedule a wound check appointment 10-14 days, after your procedure on 04/26/20, with the device clinic.  Your physician recommends that you schedule a follow up appointment in 91 days, after your procedure on 04/26/20, with Dr. Sallyanne Kuster.  Thank you for choosing [facility]!!      Any Other Special Instructions Will Be Listed Below (If Applicable).    [facility] at [place], [place]  [place], [place] [address]  Phone: [phone] Fax: [phone]    Generator Change Procedure Instructions  You are scheduled for a Generator Change (battery change) on  04/26/20  with Dr. Sallyanne Kuster.  1. Please arrive at the [facility], Entrance "A"  at [facility] at  7 am on the day of your procedure. (The address is [address])  2. DIET: You may have a light, early breakfast the morning of your procedure. NOTHING TO EAT AFTER 8:00 AM.  3. LABS:   You will need to have the coronavirus test completed prior to your procedure. An appointment has been made at 2:50 pm on 04/26/20. This is a Drive Up Visit at [address], [place], [place] [address]. Please tell them that you are there for procedure testing. Stay in your car and someone will be with you  shortly. Please make sure to have all other labs completed before this test because you will need to stay quarantined until your procedure.   4. MEDICATIONS: Hold the Xarelto Sunday night the 23rd.  5.  Plan for an overnight stay.  Bring your insurance cards and a list of you medications.  6.  Wash your chest and neck with surgical scrub the evening before and the morning of your procedure.  Rinse well. Please review the surgical scrub instruction sheet given to you.   7. Your chest will need to be shaved prior to this procedure (if needed). We ask that you do this yourself at home 1 to 2 days before or if uncomfortable/unable to do yourself, then it will be performed by the hospital staff the day of.  * Special note:  Every effort is made to have your procedure done on time.  Occasionally there are emergencies that present themselves at the hospital that may cause delays.  Please be patient if a delay does occur.                                                                                                           *  If you have any questions after you get home, please call Lattie Haw, RN at [phone].    [facility] - Preparing For Surgery  Before surgery, you can play an important role. Because skin is not sterile, your skin needs to be as free of germs as possible. You can reduce the number of germs on your skin by washing with CHG (chlorahexidine gluconate) Soap before surgery.  CHG is an antiseptic cleaner which kills germs and bonds with the skin to continue killing germs even after washing.   Please do not use if you have an allergy to CHG or antibacterial soaps.  If your skin becomes reddened/irritated stop using the CHG.   Do not shave (including legs and underarms) for at least 48 hours prior to first CHG shower.  It is OK to shave your face.  Please follow these instructions carefully:  1.  Shower the night before surgery and the morning of surgery with CHG.  2.  If you choose  to wash your hair, wash your hair first as usual with your normal shampoo.  3.  After you shampoo, rinse your hair and body thoroughly to remove the shampoo.  4.  Use CHG as you would any other liquid soap.  You can apply CHG directly to the skin and wash gently with a clean washcloth. 5.  Apply the CHG Soap to your body ONLY FROM THE NECK DOWN.  Do not use on open wounds or open sores.  Avoid contact with your eyes, ears, mouth and genitals (private parts).    6.  Wash thoroughly, paying special attention to the area where your surgery will be performed.  7.  Thoroughly rinse your body with warm water from the neck down.   8.  DO NOT shower/wash with your normal soap after using and rinsing off the CHG soap.  9.  Pat yourself dry with a clean towel.   10.  Wear clean pajamas.   11.  Place clean sheets on your bed the night of your first shower and do not sleep with pets.  Day of Surgery: Do not apply any deodorants/lotions.  Please wear clean clothes to the hospital/surgery center.

## 2020-04-22 NOTE — Telephone Encounter (Signed)
Pt c/o of Chest Pain: STAT if CP now or developed within 24 hours  1. Are you having CP right now? no  2. Are you experiencing any other symptoms (ex. SOB, nausea, vomiting, sweating)? Dizziness, fatigue  3. How long have you been experiencing CP? A couple days ago,   4. Is your CP continuous or coming and going? Comes and goes  5. Have you taken Nitroglycerin? No   Patient states she has been having chest tightness and a pain in her shoulder blades. She states it sometimes will also travel to her neck. She states it mainly has happened when she went on walks, but has happened when she hadn't gone anywhere. She states she also has been feeling dizzy after she stands, but if she stands still it does go away. She states she does not have a BP cuff, but has checked her HR. She states her HR this morning was 40 something, but then went up to 65.

## 2020-04-23 LAB — CBC
Hematocrit: 36.1 % (ref 34.0–46.6)
Hemoglobin: 12.5 g/dL (ref 11.1–15.9)
MCH: 32.1 pg (ref 26.6–33.0)
MCHC: 34.6 g/dL (ref 31.5–35.7)
MCV: 93 fL (ref 79–97)
Platelets: 253 10*3/uL (ref 150–450)
RBC: 3.89 x10E6/uL (ref 3.77–5.28)
RDW: 11.8 % (ref 11.7–15.4)
WBC: 8.1 10*3/uL (ref 3.4–10.8)

## 2020-04-23 LAB — SARS CORONAVIRUS 2 (TAT 6-24 HRS): SARS Coronavirus 2: NEGATIVE

## 2020-04-23 LAB — BASIC METABOLIC PANEL
BUN/Creatinine Ratio: 18 (ref 12–28)
BUN: 21 mg/dL (ref 8–27)
CO2: 24 mmol/L (ref 20–29)
Calcium: 10 mg/dL (ref 8.7–10.3)
Chloride: 105 mmol/L (ref 96–106)
Creatinine, Ser: 1.2 mg/dL — ABNORMAL HIGH (ref 0.57–1.00)
GFR calc Af Amer: 54 mL/min/{1.73_m2} — ABNORMAL LOW (ref >59)
GFR calc non Af Amer: 47 mL/min/{1.73_m2} — ABNORMAL LOW (ref >59)
Glucose: 93 mg/dL (ref 65–99)
Potassium: 5.4 mmol/L — ABNORMAL HIGH (ref 3.5–5.2)
Sodium: 142 mmol/L (ref 134–144)

## 2020-04-25 ENCOUNTER — Other Ambulatory Visit: Payer: Self-pay | Admitting: *Deleted

## 2020-04-25 DIAGNOSIS — I442 Atrioventricular block, complete: Secondary | ICD-10-CM

## 2020-04-26 ENCOUNTER — Ambulatory Visit (HOSPITAL_COMMUNITY)
Admission: RE | Admit: 2020-04-26 | Discharge: 2020-04-26 | Disposition: A | Discharge status: Home / Self Care | Payer: PPO | Attending: Cardiovascular Disease | Admitting: Cardiovascular Disease

## 2020-04-26 ENCOUNTER — Encounter (HOSPITAL_COMMUNITY)
Admission: RE | Disposition: A | Discharge status: Home / Self Care | Payer: Self-pay | Source: Home / Self Care | Attending: Cardiovascular Disease

## 2020-04-26 DIAGNOSIS — I442 Atrioventricular block, complete: Secondary | ICD-10-CM | POA: Insufficient documentation

## 2020-04-26 DIAGNOSIS — Z7901 Long term (current) use of anticoagulants: Secondary | ICD-10-CM | POA: Diagnosis not present

## 2020-04-26 DIAGNOSIS — I472 Ventricular tachycardia: Secondary | ICD-10-CM | POA: Insufficient documentation

## 2020-04-26 DIAGNOSIS — Z45010 Encounter for checking and testing of cardiac pacemaker pulse generator [battery]: Secondary | ICD-10-CM | POA: Insufficient documentation

## 2020-04-26 DIAGNOSIS — Z79899 Other long term (current) drug therapy: Secondary | ICD-10-CM | POA: Diagnosis not present

## 2020-04-26 DIAGNOSIS — I480 Paroxysmal atrial fibrillation: Secondary | ICD-10-CM | POA: Diagnosis not present

## 2020-04-26 DIAGNOSIS — E785 Hyperlipidemia, unspecified: Secondary | ICD-10-CM | POA: Insufficient documentation

## 2020-04-26 HISTORY — PX: PPM GENERATOR CHANGEOUT: EP1233

## 2020-04-26 SURGERY — PPM GENERATOR CHANGEOUT

## 2020-04-26 MED ORDER — CHLORHEXIDINE GLUCONATE 4 % EX LIQD: 4.0000 "application " | Freq: Once | CUTANEOUS | Status: DC

## 2020-04-26 MED ORDER — CEFAZOLIN SODIUM-DEXTROSE 2-3 GM-%(50ML) IV SOLR
INTRAVENOUS | Status: DC | PRN
  Administered 2020-04-26: 2 g via INTRAVENOUS

## 2020-04-26 MED ORDER — SODIUM CHLORIDE 0.9 % IV SOLN
INTRAVENOUS | Status: AC
  Filled 2020-04-26: qty 2

## 2020-04-26 MED ORDER — SODIUM CHLORIDE 0.9% FLUSH: 3.0000 mL | INTRAVENOUS | Status: DC | PRN

## 2020-04-26 MED ORDER — SODIUM CHLORIDE 0.9 % IV SOLN
INTRAVENOUS | Status: DC | PRN
  Administered 2020-04-26: 500 mL

## 2020-04-26 MED ORDER — SODIUM CHLORIDE 0.9 % IV SOLN: 80.0000 mg | INTRAVENOUS | Status: DC

## 2020-04-26 MED ORDER — CEFAZOLIN SODIUM-DEXTROSE 2-4 GM/100ML-% IV SOLN
INTRAVENOUS | Status: AC
  Filled 2020-04-26: qty 100

## 2020-04-26 MED ORDER — LIDOCAINE HCL (PF) 1 % IJ SOLN
INTRAMUSCULAR | Status: AC
  Filled 2020-04-26: qty 60

## 2020-04-26 MED ORDER — CEFAZOLIN SODIUM-DEXTROSE 2-4 GM/100ML-% IV SOLN: 2.0000 g | INTRAVENOUS | Status: DC

## 2020-04-26 MED ORDER — ACETAMINOPHEN 325 MG PO TABS
325.0000 mg | ORAL_TABLET | ORAL | Status: DC | PRN
  Filled 2020-04-26: qty 2

## 2020-04-26 MED ORDER — SODIUM CHLORIDE 0.9 % IV SOLN: 250.0000 mL | INTRAVENOUS | Status: DC | PRN

## 2020-04-26 MED ORDER — LIDOCAINE HCL (PF) 1 % IJ SOLN
INTRAMUSCULAR | Status: DC | PRN
  Administered 2020-04-26: 40 mL via INTRADERMAL

## 2020-04-26 MED ORDER — SODIUM CHLORIDE 0.9 % IV SOLN: INTRAVENOUS | Status: DC

## 2020-04-26 MED ORDER — CEFAZOLIN SODIUM-DEXTROSE 1-4 GM/50ML-% IV SOLN: INTRAVENOUS | Status: DC | PRN

## 2020-04-26 MED ORDER — SODIUM CHLORIDE 0.9% FLUSH: 3.0000 mL | Freq: Two times a day (BID) | INTRAVENOUS | Status: DC

## 2020-04-26 MED ORDER — ONDANSETRON HCL 4 MG/2ML IJ SOLN: 4.0000 mg | Freq: Four times a day (QID) | INTRAMUSCULAR | Status: DC | PRN

## 2020-04-26 SURGICAL SUPPLY — 8 items
CABLE SURGICAL S-101-97-12 (CABLE) ×2 IMPLANT
IPG PACE AZUR XT DR MRI W1DR01 (Pacemaker) IMPLANT
KIT WRENCH (KITS) ×1 IMPLANT
PACE AZURE XT DR MRI W1DR01 (Pacemaker) ×2 IMPLANT
PAD PRO RADIOLUCENT 2001M-C (PAD) ×2 IMPLANT
POUCH AIGIS-R ANTIBACT PPM (Mesh General) ×2 IMPLANT
POUCH AIGIS-R ANTIBACT PPM MED (Mesh General) IMPLANT
TRAY PACEMAKER INSERTION (PACKS) ×2 IMPLANT

## 2020-04-26 NOTE — Op Note (Signed)
Procedure report  Procedure performed:  1. Dual chamber pacemaker generator changeout   Reason for procedure:  1. Device generator at elective replacement interval  Procedure performed by:  Sanda Klein, MD  Complications:  None  Estimated blood loss:  <5 mL  Medications administered during procedure:  Ancef 2 g intravenously, lidocaine 1% 30 mL locally Device details:   New Generator Medtronic Azure XT DR model number [id number], serial number [id number] Right atrial lead (chronic) Medtronic, model number [id number], serial [id number] (implanted 11/05/08) Right ventricular lead (chronic)  Medtronic, model number C338645, serial number [id number] (implanted 11/05/08)  Explanted generator Medtronic Adapta,  model number [id number], serial number  [id number] (implanted 11/05/2008)  Procedure details:  After the risks and benefits of the procedure were discussed the patient provided informed consent. She was brought to the cardiac catheter lab in the fasting state. The patient was prepped and draped in usual sterile fashion. Local anesthesia with 1% lidocaine was administered to to the left infraclavicular area. A 5-6cm horizontal incision was made parallel with and 2-3 cm caudal to the left clavicle, in the area of an old scar. An older scar was seen closer to the left clavicle. Using minimal electrocautery and mostly sharp and blunt dissection the prepectoral pocket was opened carefully to avoid injury to the loops of chronic leads. Extensive dissection was not necessary. The device was explanted. The pocket was carefully inspected for hemostasis and flushed with copious amounts of antibiotic solution.  The leads were disconnected from the old generator and immediately connected to the chronic leads, with appropriate pacing noted. Testing of the lead parameters via telemetryshowed excellent values, unchanged from pre-procedure.The device placed in a previously soaked [place]is antibiotic pouch  The entire  system was then carefully inserted in the pocket with care been taking that the leads and device assumed a comfortable position without pressure on the incision. Great care was taken that the leads be located deep to the generator. The pocket was then closed in layers using 2 layers of 2-0 Vicryl, one layer of 3-0 Vicryl cutaneous steristrips after which a sterile dressing was applied.   At the end of the procedure the following lead parameters were encountered:   Right atrial lead sensed P waves 3.1 mV, impedance 380 ohms, threshold 0.5 at 0.4 ms pulse width.  Right ventricular lead sensed R waves  None detected, impedance 418 ohms, threshold 0.75 at 0.4 ms pulse width.  Sanda Klein, MD, [facility] [phone] office [phone] pager

## 2020-04-26 NOTE — Progress Notes (Signed)
Ok to d/c home now

## 2020-04-26 NOTE — Interval H&P Note (Signed)
History and Physical Interval Note:  04/26/2020 1:03 PM  Brandy [name]  has presented today for surgery, with the diagnosis of ERI.  The various methods of treatment have been discussed with the patient and family. After consideration of risks, benefits and other options for treatment, the patient has consented to  Procedure(s): PPM GENERATOR CHANGEOUT (N/A) as a surgical intervention.  The patient's history has been reviewed, patient examined, no change in status, stable for surgery.  I have reviewed the patient's chart and labs.  Questions were answered to the patient's satisfaction.     [name]

## 2020-04-26 NOTE — Discharge Instructions (Signed)
Pacemaker Battery Change, Care After This sheet gives you information about how to care for yourself after your procedure. Your health care provider may also give you more specific instructions. If you have problems or questions, contact your health care provider. What can I expect after the procedure? After the procedure, it is common to have these symptoms at the site where the pacemaker was inserted:  Mild pain or soreness.  Slight bruising.  Some swelling over the incisions.  A slight bump over the skin where the device was placed (if it was implanted in the upper chest area). Sometimes, it is possible to feel the device under the skin. This is normal. Follow these instructions at home: Incision care  Keep the incision clean and dry for 2-3 days after the procedure or as told by your health care provider. It takes several weeks for the incision site to completely heal.  Do not remove the bandage (dressing) on your chest until told to do so by your health care provider.  Leave stitches (sutures), skin glue, or adhesive strips in place. These skin closures may need to stay in place for 2 weeks or longer. If adhesive strip edges start to loosen and curl up, you may trim the loose edges. Do not remove adhesive strips completely unless your health care provider tells you to do that.  Do not take baths, swim, or use a hot tub for 7-10 days or until your health care provider approves. Ask your health care provider if you may take showers. You may only be allowed to take sponge baths.  Pat the incision area dry with a clean towel. Do not rub the area. This may cause bleeding.  Check your incision area every day for signs of infection. Check for: ? More redness, swelling, or pain. ? Fluid or blood. ? Warmth. ? Pus or a bad smell.  Avoid putting pressure on the area where the pacemaker was placed. Women may want to place a small pad over the incision site to protect it from their bra strap.    Medicines  Take over-the-counter and prescription medicines only as told by your health care provider.  If you were prescribed an antibiotic medicine, take it as told by your health care provider. Do not stop taking the antibiotic even if you start to feel better. Activity  For the first 2 weeks, or as long as told by your health care provider: ? Avoid lifting your left arm higher than your shoulder. ? Be gentle when you move your arms over your head. It is okay to raise your arm to comb your hair. ? Avoid exercise or activities that take a lot of effort.  Ask your health care provider when it is okay to: ? Return to your normal activities. ? Return to work or school. ? Resume sexual activity.  If you were given a medicine to help you relax (sedative) during the procedure, it can affect you for several hours. Do not drive or operate machinery until your health care provider says that it is safe. General instructions  Do not use any products that contain nicotine or tobacco, such as cigarettes, e-cigarettes, and chewing tobacco. These can delay incision healing after surgery. If you need help quitting, ask your health care provider.  Always let all health care providers, including dentists, know about your pacemaker before you have any medical procedures or tests.  You may be shown how to transfer data from your pacemaker through the phone to your  health care provider.  Wear a medical ID bracelet or necklace stating that you have a pacemaker, and carry a pacemaker ID card with you at all times.  Avoid close and prolonged exposure to electrical devices that have strong magnetic fields. These include: ? Airport security checkpoints. When at the airport, let officials know that you have a pacemaker. Carry your pacemaker ID card. ? Metal detectors. If you must pass through a metal detector, walk through it quickly. Do not stop under the detector or stand near it.  When using your mobile  phone, hold it to the ear opposite the pacemaker. Do not leave your mobile phone in a pocket over the pacemaker.  Your pacemaker battery will last for 5-15 years. Your health care provider will do routine checks to know when the battery is starting to run down. When this happens, the pacemaker will need to be replaced.  Keep all follow-up visits as told by your health care provider. This is important. Contact a health care provider if:  You have pain at the incision site that is not relieved by medicines.  You have any of these signs of infection: ? More redness, swelling, or pain around your incision. ? Fluid or blood coming from your incision. ? Warmth coming from your incision. ? Pus or a bad smell coming from your incision. ? A fever.  You feel brief, occasional palpitations, light-headedness, or any symptoms that you think might be related to your heart. Get help right away if:  You have chest pain that is different from the pain at the pacemaker site.  You develop a red streak that extends above or below the incision site.  You have shortness of breath.  You have palpitations or an irregular heartbeat.  You have light-headedness that does not go away quickly.  You faint or have dizzy spells.  Your pulse suddenly drops or increases rapidly and does not return to normal.  You gain weight and your legs and ankles swell. Summary  After the procedure, it is common to have pain, soreness, and some swelling or bruising where the pacemaker was inserted.  Keep your incision clean and dry. Follow instructions from your health care provider about how to take care of your incision.  Check your incision every day for signs of infection, such as more pain or swelling, pus or a bad smell, warmth, or leaking fluid or blood.  Carry a pacemaker ID card with you at all times. This information is not intended to replace advice given to you by your health care provider. Make sure you  discuss any questions you have with your health care provider. Document Revised: 02/20/2019 Document Reviewed: 02/20/2019 Elsevier Patient Education  2021 Elsevier Inc.  

## 2020-04-27 ENCOUNTER — Encounter (HOSPITAL_COMMUNITY): Payer: Self-pay | Admitting: Cardiovascular Disease

## 2020-05-06 ENCOUNTER — Other Ambulatory Visit: Payer: Self-pay | Admitting: Podiatry

## 2020-05-06 ENCOUNTER — Other Ambulatory Visit: Payer: Self-pay | Admitting: Cardiovascular Disease

## 2020-05-06 NOTE — Telephone Encounter (Signed)
Please advise 

## 2020-05-10 ENCOUNTER — Ambulatory Visit (INDEPENDENT_AMBULATORY_CARE_PROVIDER_SITE_OTHER): Payer: PPO | Admitting: Podiatry

## 2020-05-10 ENCOUNTER — Other Ambulatory Visit: Payer: Self-pay

## 2020-05-10 DIAGNOSIS — R600 Localized edema: Secondary | ICD-10-CM | POA: Diagnosis not present

## 2020-05-10 DIAGNOSIS — G5791 Unspecified mononeuropathy of right lower limb: Secondary | ICD-10-CM

## 2020-05-10 NOTE — Progress Notes (Signed)
   Subjective:  Patient presents today for follow-up evaluation of neuritis to the right foot.  Patient does have a history of bunionectomy and hammertoe repair second digit right foot.  DOS: 11/13/2019.  Patient states that the gabapentin she was prescribed last visit helped significantly.  She no longer experiences any pins-and-needles to the forefoot.  Patient's biggest concern today is the swelling and edema that she gets intermittently to the foot.  She denies any pain associated to the foot.  She presents for further treatment and evaluation  Past Medical History:  Diagnosis Date  . Allergic rhinitis   . Asthmatic bronchitis   . Complete heart block ([place])   . GERD (gastroesophageal reflux disease)   . MRSA (methicillin resistant Staphylococcus aureus) 2013   left buttock  . Nonsustained ventricular tachycardia ([place])   . 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 Vaginitis   . Vocal cord paresis     Objective: Physical Exam General: The patient is alert and oriented x3 in no acute distress.  Dermatology: Skin is cool, dry and supple bilateral lower extremities. Negative for open lesions or macerations.  Vascular: Palpable pedal pulses bilaterally. No significant edema or erythema noted. Capillary refill within normal limits.  Neurological: Epicritic and protective threshold grossly intact bilaterally.  There is some sharp shooting paresthesia with light touch around the forefoot.  Musculoskeletal Exam: All pedal and ankle joints range of motion within normal limits bilateral. Muscle strength 5/5 in all groups bilateral.   Assessment: 1. H/0 bunionectomy with first metatarsal osteotomy and hammertoe repair second digit right foot. DOS: 11/13/2019 2.  Neuritis right forefoot; resolved   Plan of Care:  1. Patient was evaluated.  2. continue wearing good supportive shoes that do not constrict the toebox 3.  Continue gabapentin 100 mg nightly until  prescription is completed 4.  Return to clinic as needed  *Goes by [name], DPM [facility]  Dr. Edrick Kins, [address]                                        [place], [place] [address]                Office [phone]  Fax [phone]

## 2020-05-11 ENCOUNTER — Ambulatory Visit (INDEPENDENT_AMBULATORY_CARE_PROVIDER_SITE_OTHER): Payer: PPO | Admitting: Emergency Medicine

## 2020-05-11 DIAGNOSIS — I442 Atrioventricular block, complete: Secondary | ICD-10-CM

## 2020-05-11 NOTE — Progress Notes (Unsigned)
Wound check appointment. Steri-strips removed. Wound without redness or edema. Incision edges approximated, wound well healed. Pacemaker check in clinic. Normal device function. Thresholds, sensing, impedances consistent with previous measurements. Device programmed to maximize longevity. One NSVT episode noted, less than 20 beats. Device programmed at appropriate safety margins. Histogram distribution appropriate for patient activity level. Device programmed to optimize intrinsic conduction.  Next remote on 07/27/2020.

## 2020-05-12 LAB — CUP PACEART INCLINIC DEVICE CHECK
Battery Remaining Longevity: 125 mo
Battery Voltage: 3.22 V
Brady Statistic AP VP Percent: 81.03 %
Brady Statistic AP VS Percent: 0.09 %
Brady Statistic AS VP Percent: 15.35 %
Brady Statistic AS VS Percent: 3.53 %
Brady Statistic RA Percent Paced: 83.06 %
Brady Statistic RV Percent Paced: 96.38 %
Date Time Interrogation Session: 20220208162300
Implantable Lead Implant Date: 20100505
Implantable Lead Implant Date: 20100505
Implantable Lead Location: 753859
Implantable Lead Location: 753860
Implantable Lead Model: 5076
Implantable Lead Model: 5076
Implantable Pulse Generator Implant Date: 20220124
Lead Channel Impedance Value: 342 Ohm
Lead Channel Impedance Value: 342 Ohm
Lead Channel Impedance Value: 399 Ohm
Lead Channel Impedance Value: 399 Ohm
Lead Channel Impedance Value: 437 Ohm
Lead Channel Impedance Value: 437 Ohm
Lead Channel Impedance Value: 513 Ohm
Lead Channel Impedance Value: 513 Ohm
Lead Channel Pacing Threshold Amplitude: 0.75 V
Lead Channel Pacing Threshold Amplitude: 0.75 V
Lead Channel Pacing Threshold Pulse Width: 0.4 ms
Lead Channel Pacing Threshold Pulse Width: 0.4 ms
Lead Channel Sensing Intrinsic Amplitude: 3.4 mV
Lead Channel Setting Pacing Amplitude: 2 V
Lead Channel Setting Pacing Amplitude: 2.5 V
Lead Channel Setting Pacing Pulse Width: 0.4 ms
Lead Channel Setting Sensing Sensitivity: 1.2 mV

## 2020-06-18 ENCOUNTER — Other Ambulatory Visit: Payer: Self-pay | Admitting: Cardiovascular Disease

## 2020-06-18 DIAGNOSIS — I480 Paroxysmal atrial fibrillation: Secondary | ICD-10-CM

## 2020-06-18 NOTE — Telephone Encounter (Signed)
Prescription refill request for Xarelto received.  Indication: atrial fibrillation Last office visit: 2/22 croitoru Weight:68.5 kg Age:65 Scr: 1.2  1/22 CrCl: 49.87 ml/min  May need to reduce Xarelto to 15 mg Daily.  Please review

## 2020-06-22 NOTE — Telephone Encounter (Signed)
Patient CrCl 50/ml/min, Scr elevated at last visit but had been lower.  Will continue at 20mg  for now and recheck BMP.  Called patient to let her know, has an appt with Dr C in April.  Can update BMP at that time

## 2020-06-25 IMAGING — CT CT MAXILLOFACIAL W/O CM
3 of 6 series · 14 of 47 positions shown, 17 images · non-contrast
Comparison: None.

CLINICAL DATA: Fell. Hit head.

EXAM:
CT HEAD WITHOUT CONTRAST
CT MAXILLOFACIAL WITHOUT CONTRAST
CT CERVICAL SPINE WITHOUT CONTRAST
TECHNIQUE: Multidetector CT imaging of the head, cervical spine, and
maxillofacial structures were performed using the standard protocol
without intravenous contrast. Multiplanar CT image reconstructions
of the cervical spine and maxillofacial structures were also
generated.

[Series 3: maxilllofacial 2.0 hr40 3 · axial · 0.34mm/px · z∈[+724,+846]mm · 9 of 73 slices shown, 12 images]
[im 6/73  brain]
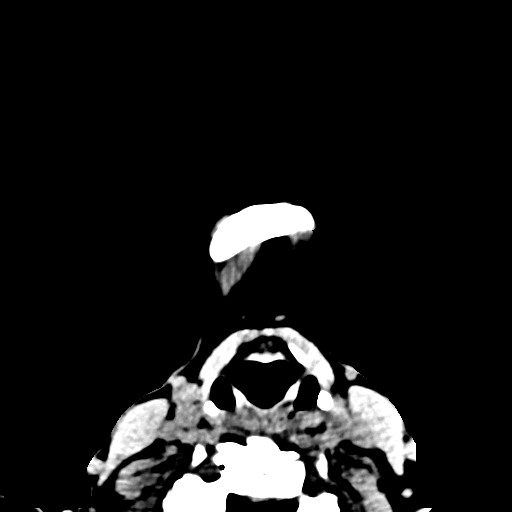
[im 6/73  bone]
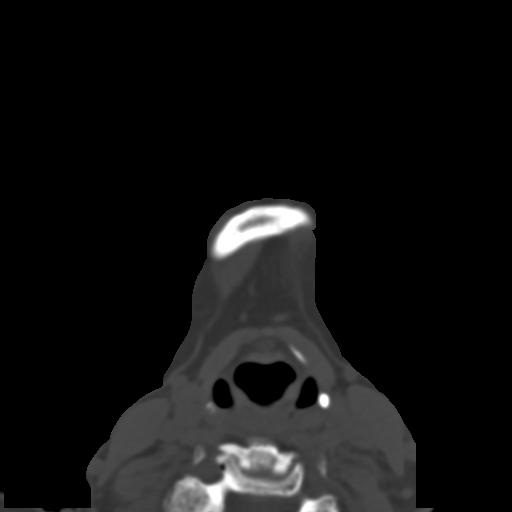
[im 16/73  bone]
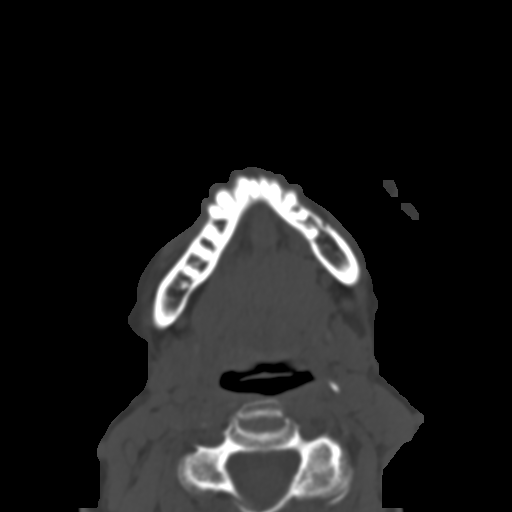
[im 21/73  bone]
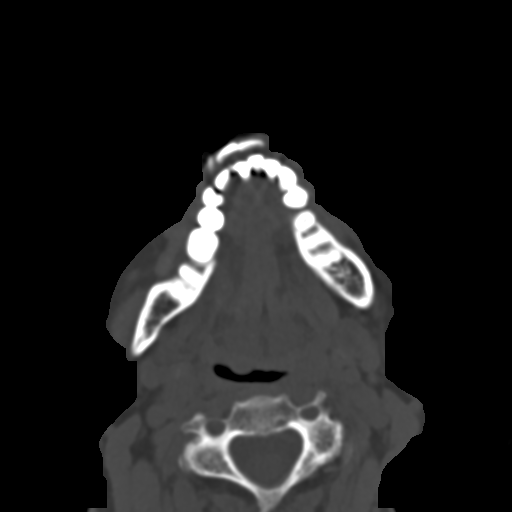
[im 31/73  bone]
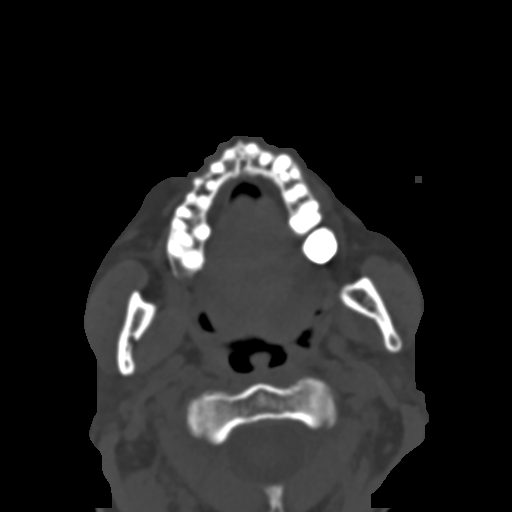
[im 37/73  brain]
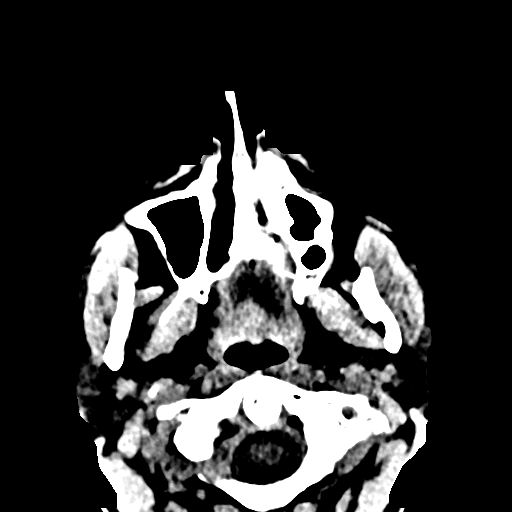
[im 37/73  bone]
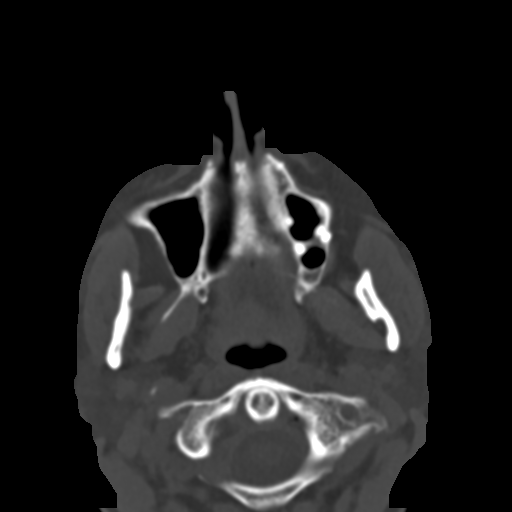
[im 42/73  bone]
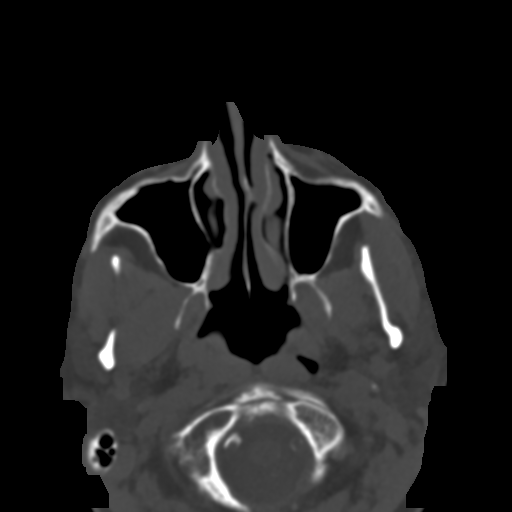
[im 52/73  bone]
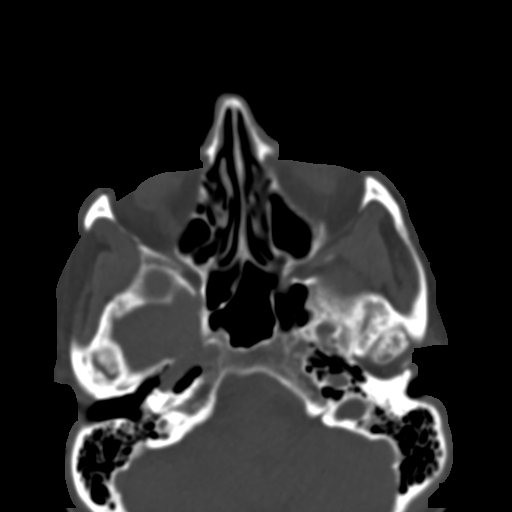
[im 57/73  bone]
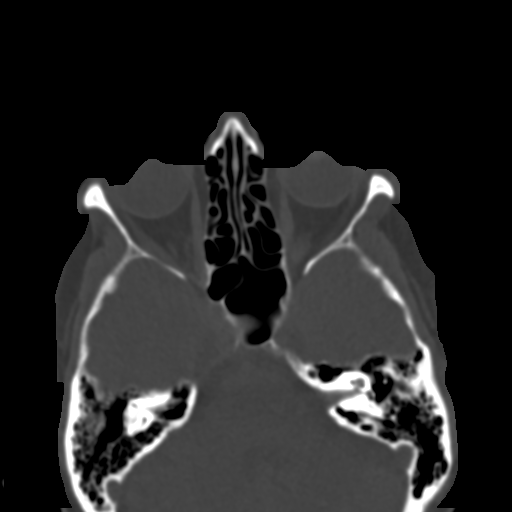
[im 67/73  brain]
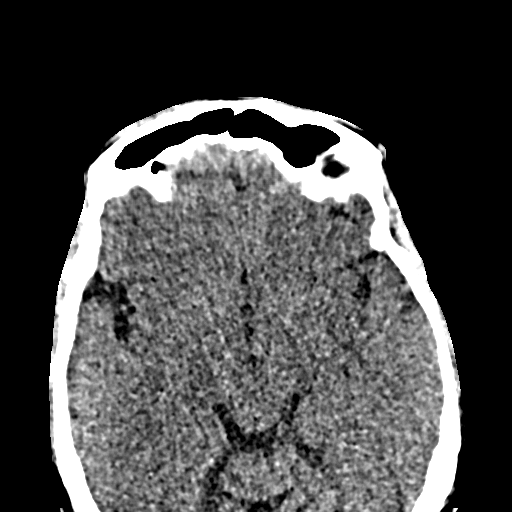
[im 67/73  bone]
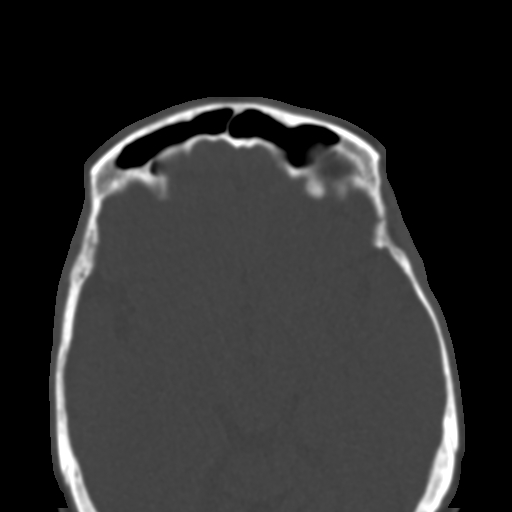

[Series 7: st cor · coronal · 0.29mm/px · 3 of 87 slices shown]
[im 22/87  bone]
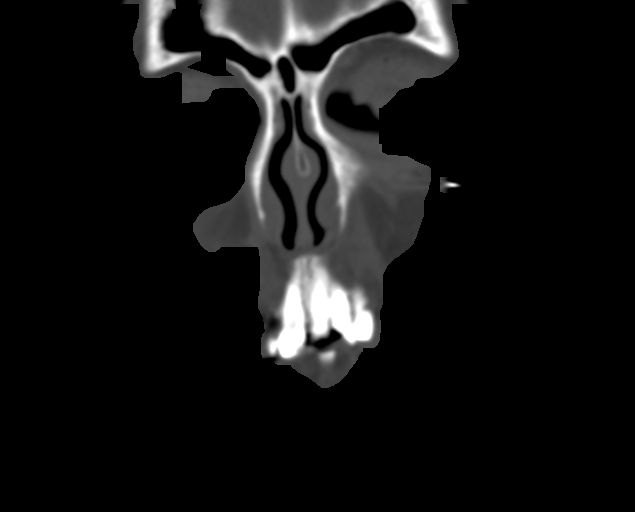
[im 44/87  bone]
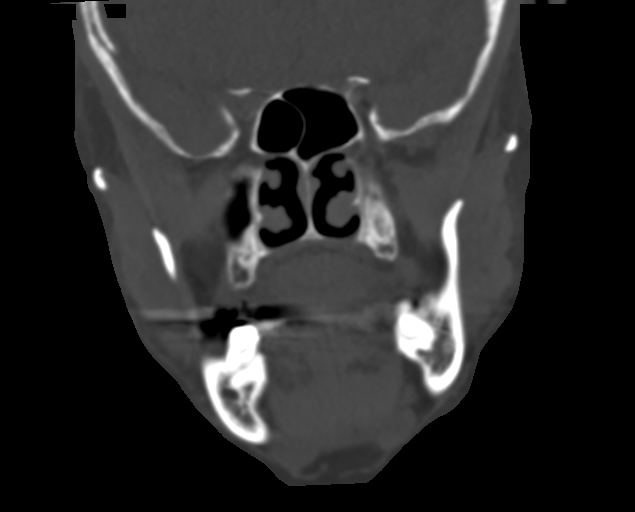
[im 65/87  bone]
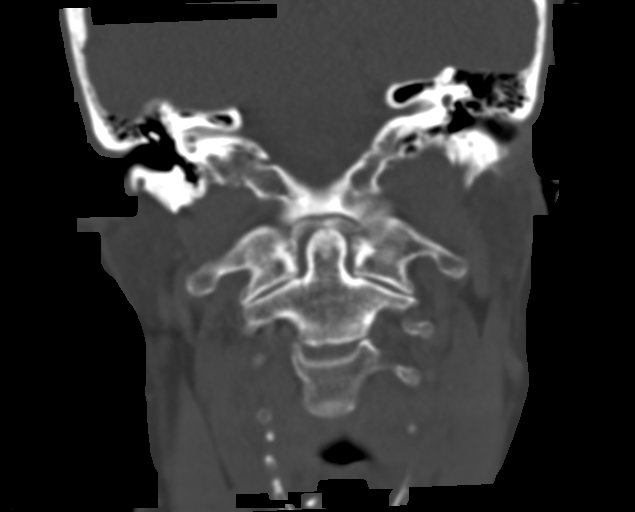

[Series 10: bone sag · sagittal · 0.29mm/px · 2 of 91 slices shown]
[im 31/91  bone]
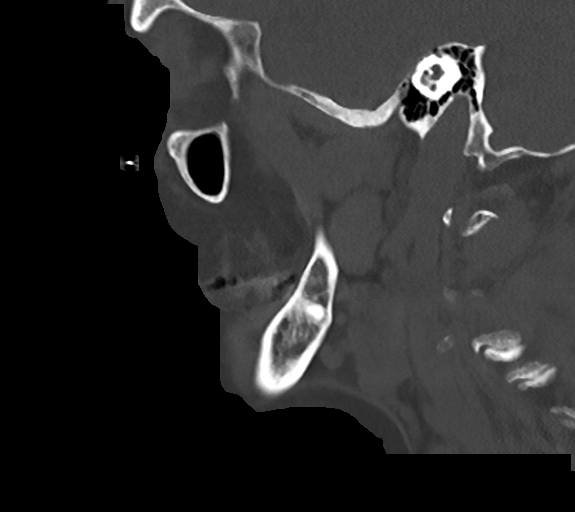
[im 61/91  bone]
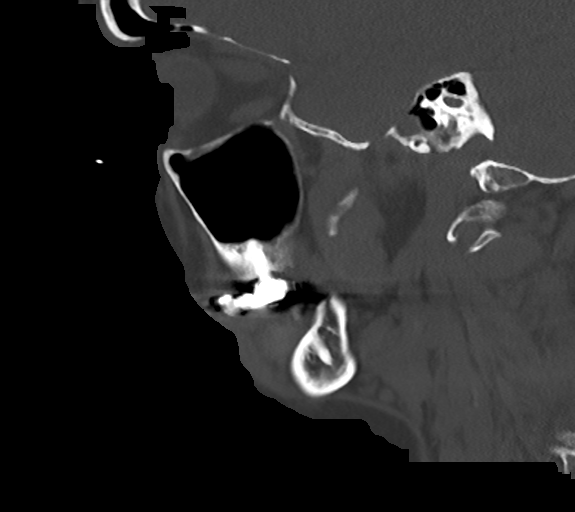

[14 of 47 positions shown; findings below may reference images not displayed]

FINDINGS: CT HEAD FINDINGS

Brain: The ventricles are in the midline without mass effect or
shift. They are normal in size and configuration for age. The
gray-white differentiation is maintained. No extra-axial fluid
collections are identified. No mass lesions. No findings for
hemispheric infarction or intracranial hemorrhage. Brainstem and
cerebellum appear normal.

Vascular: Vascular calcifications but no aneurysm or hyperdense
vessels.

Skull: No skull fracture or bone lesions.

Other: No scalp lesions or scalp hematoma.

CT MAXILLOFACIAL FINDINGS

Osseous: No acute facial bone fractures are identified. The
mandibular condyles are normally located. Significant bilateral TMJ
degenerative changes. No mandible fracture is identified.

Orbits: No orbital fractures. The globes are intact.

Sinuses: The paranasal sinuses and mastoid air cells are clear.

Soft tissues: No obvious laceration, contusion or hematoma.

CT CERVICAL SPINE FINDINGS

Alignment: Degenerative posterior subluxation of C5 compared to C4
and C6. This is due to significant facet disease.

Skull base and vertebrae: No acute fracture. No primary bone lesion
or focal pathologic process.

Soft tissues and spinal canal: No prevertebral fluid or swelling. No
visible canal hematoma.

Disc levels: The spinal canal is quite generous. No significant
spinal stenosis. No large disc protrusions are identified.

Upper chest: The lung apices are grossly clear. Biapical pleural and
parenchymal scarring changes are noted.

Other: Age advanced vertebral artery calcifications.

Surgical changes with some type of radiopaque material in the region
of the true cords bilaterally.
IMPRESSION: 1. No acute intracranial findings or skull fracture.
2. No acute facial bone fractures are identified.
3. Severe/advanced degenerative changes at the TMJs bilaterally.
4. No acute cervical spine fracture. Age advanced degenerative
cervical spondylosis with multilevel disc disease and facet disease.

## 2020-06-30 DIAGNOSIS — D225 Melanocytic nevi of trunk: Secondary | ICD-10-CM | POA: Diagnosis not present

## 2020-06-30 DIAGNOSIS — L821 Other seborrheic keratosis: Secondary | ICD-10-CM | POA: Diagnosis not present

## 2020-06-30 DIAGNOSIS — L812 Freckles: Secondary | ICD-10-CM | POA: Diagnosis not present

## 2020-06-30 DIAGNOSIS — L308 Other specified dermatitis: Secondary | ICD-10-CM | POA: Diagnosis not present

## 2020-06-30 DIAGNOSIS — L720 Epidermal cyst: Secondary | ICD-10-CM | POA: Diagnosis not present

## 2020-07-02 ENCOUNTER — Ambulatory Visit (INDEPENDENT_AMBULATORY_CARE_PROVIDER_SITE_OTHER): Payer: PPO

## 2020-07-02 ENCOUNTER — Other Ambulatory Visit: Payer: Self-pay

## 2020-07-02 ENCOUNTER — Ambulatory Visit (INDEPENDENT_AMBULATORY_CARE_PROVIDER_SITE_OTHER): Payer: PPO | Admitting: Podiatry

## 2020-07-02 ENCOUNTER — Encounter: Payer: Self-pay | Admitting: Podiatry

## 2020-07-02 DIAGNOSIS — M21611 Bunion of right foot: Secondary | ICD-10-CM | POA: Diagnosis not present

## 2020-07-02 DIAGNOSIS — Z9889 Other specified postprocedural states: Secondary | ICD-10-CM

## 2020-07-02 DIAGNOSIS — S9031XA Contusion of right foot, initial encounter: Secondary | ICD-10-CM

## 2020-07-02 DIAGNOSIS — M2041 Other hammer toe(s) (acquired), right foot: Secondary | ICD-10-CM | POA: Diagnosis not present

## 2020-07-02 DIAGNOSIS — S92911A Unspecified fracture of right toe(s), initial encounter for closed fracture: Secondary | ICD-10-CM

## 2020-07-02 NOTE — Progress Notes (Signed)
   HPI: 65 y.o. female presenting today for new complaint regarding pain and tenderness to the right forefoot that happened yesterday, 07/01/2020.  Patient states that she was walking her dog when she slipped on a rock in her toes bent backwards.  She was able to walk home however after she sat down for a while and tried to stand back up she had severe pain and tenderness to her right forefoot.  She has a history of right foot surgery August 2021 and she was concerned that she is possibly most of her surgery.  Prior to the incident she was having no pain and ambulating just fine.  She presents for further treatment evaluation  Past Medical History:  Diagnosis Date  . Allergic rhinitis   . Asthmatic bronchitis   . Complete heart block ([place])   . GERD (gastroesophageal reflux disease)   . MRSA (methicillin resistant Staphylococcus aureus) 2013   left buttock  . Nonsustained ventricular tachycardia ([place])   . 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 Vaginitis   . Vocal cord paresis      Physical Exam: General: The patient is alert and oriented x3 in no acute distress.  Dermatology: Skin is warm, dry and supple bilateral lower extremities. Negative for open lesions or macerations.  Vascular: Palpable pedal pulses bilaterally.  There is some ecchymosis and edema localized around the right forefoot.  Capillary refill within normal limits.  Neurological: Epicritic and protective threshold grossly intact bilaterally.   Musculoskeletal Exam: No pedal deformities noted.  Associated tenderness to palpation along the second and third toes of the right foot  Radiographic Exam:  Normal osseous mineralization.  Closed nondisplaced fractures noted to the bases of the second and third toes of the right foot.  Orthopedic hardware is intact to the first metatarsal.  No fractures identified along the first ray.  Assessment: 1.  Comminuted fracture base of the second and third  proximal phalanx right foot, closed, nondisplaced   Plan of Care:  1. Patient evaluated. X-Rays reviewed.  2.  Continue weightbearing in the cam boot x4-6 weeks.  Patient presented today wearing her cam boot from prior surgery. 3.  Continue Tylenol as needed 4.  Return to clinic in 6 weeks for follow-up x-ray      Edrick Kins, DPM [facility]  Dr. Edrick Kins, DPM    [address], [place] [address]                Office [phone]  Fax [phone]

## 2020-07-26 ENCOUNTER — Other Ambulatory Visit: Payer: Self-pay

## 2020-07-26 ENCOUNTER — Ambulatory Visit (INDEPENDENT_AMBULATORY_CARE_PROVIDER_SITE_OTHER): Payer: PPO | Admitting: Cardiovascular Disease

## 2020-07-26 ENCOUNTER — Encounter: Payer: Self-pay | Admitting: Cardiovascular Disease

## 2020-07-26 VITALS — BP 123/76 | HR 71 | Ht 67.0 in | Wt 145.0 lb

## 2020-07-26 DIAGNOSIS — I480 Paroxysmal atrial fibrillation: Secondary | ICD-10-CM

## 2020-07-26 DIAGNOSIS — Z7901 Long term (current) use of anticoagulants: Secondary | ICD-10-CM | POA: Diagnosis not present

## 2020-07-26 DIAGNOSIS — I442 Atrioventricular block, complete: Secondary | ICD-10-CM | POA: Diagnosis not present

## 2020-07-26 DIAGNOSIS — Z950 Presence of cardiac pacemaker: Secondary | ICD-10-CM | POA: Diagnosis not present

## 2020-07-26 DIAGNOSIS — I495 Sick sinus syndrome: Secondary | ICD-10-CM

## 2020-07-26 DIAGNOSIS — E7800 Pure hypercholesterolemia, unspecified: Secondary | ICD-10-CM

## 2020-07-26 DIAGNOSIS — I472 Ventricular tachycardia: Secondary | ICD-10-CM | POA: Diagnosis not present

## 2020-07-26 DIAGNOSIS — I4729 Other ventricular tachycardia: Secondary | ICD-10-CM

## 2020-07-26 NOTE — Progress Notes (Signed)
Patient ID: Brandy [name], female   DOB: 04-Jan-1956, 65 y.o.   MRN: [id number]     Cardiology Office Note    Date:  07/28/2020   ID:  Brandy [name], DOB May 20, 1955, MRN [id number]  PCP:  Lorrene Reid, PA-C  Cardiologist:   Sanda Klein, MD   Chief Complaint  Patient presents with  . Pacemaker Check    History of Present Illness:  Brandy [name] is a 65 y.o. female with complete heart block who presents for for her first in office evaluation since her pacemaker changed out in January 2022 (Medtronic Azure). Episodes of asymptomatic ventricular tachycardia (nonsustained) and paroxysmal atrial fibrillation have been detected by her device.  She had pacemaker syndrome when her old device reached ERI (dizziness and fatigue and even intrascapular pain), all of which resolved when her pacemaker generator was changed out.  She is really not had any cardiovascular complaints since the pacemaker change of procedure.  The site has healed well.  Unfortunately, she had a fall and fractured a couple of bones in her foot.  She is wearing an orthopedic boot.  She does complain of some fatigue.  She denies exertional dyspnea or angina, orthopnea, PND, lower extremity edema, palpitations, dizziness or syncope, focal neurological complaints or claudication.  Despite her orthopedic injury, her level of activity remains quite high at over 4 hours a day.  Generator and lead parameters are all normal.  Estimated generator longevity is 10 years.  With the device programmed to DDD with a lower rate limit of 70, there is 80% atrial pacing and 96.3% ventricular pacing (The remainder is lots of ventricular premature beats).  It appears that more than 90% of her RR intervals correspond to heart rate less than 80 bpm with a very blunted heart rate histogram.  She had a low burden of paroxysmal atrial tachycardia and paroxysmal atrial fibrillation (the longest episode was about 50 minutes duration) and no recent  episodes of nonsustained VT.  She had an unusual episode of loss of consciousness in May 2021 facial injuries, tongue biting and a prolonged period of disorientation. The next day she had documented orthostatic hypotension with her systolic blood pressure dropping from 150 down to 70. She was noted to have stenosis of the vertebral arteries on CT angiography and atorvastatin was also started.  She was already on anticoagulation for asymptomatic paroxysmal atrial fibrillation detected by her pacemaker, at that time.  She subsequently underwent an MRI that did not show any major brain abnormalities.  LV EF is 50-55%, slightly low likely due to RV pacing.  No valvular abnormalities were seen.  She has a history of normal coronary arteries and a calcium score of 0 on a coronary CT angiogram performed in July 2019.   Past Medical History:  Diagnosis Date  . Allergic rhinitis   . Asthmatic bronchitis   . Complete heart block ([place])   . GERD (gastroesophageal reflux disease)   . MRSA (methicillin resistant Staphylococcus aureus) 2013   left buttock  . Nonsustained ventricular tachycardia ([place])   . 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 Vaginitis   . Vocal cord paresis     Past Surgical History:  Procedure Laterality Date  . ACROMIO-CLAVICULAR JOINT REPAIR Left 12/10/2014   Procedure: LEFT CORACOCLAVICULAR RECONSTRUCTION WITH  ZIPLOOP;  Surgeon: Tania Ade, MD;  Location: [facility];  Service: Orthopedics;  Laterality: Left;  . APPENDECTOMY  05-2010  . COLON SURGERY N/A  Phreesia 01/06/2020  . DILATION AND CURETTAGE OF UTERUS  1978  . FRACTURE SURGERY N/A    Phreesia 01/06/2020  . HAND SURGERY    . PACEMAKER INSERTION  08/05/08   Medtronic Adapta  . PPM GENERATOR CHANGEOUT N/A 04/26/2020   Procedure: PPM GENERATOR CHANGEOUT;  Surgeon: Sanda Klein, MD;  Location: [facility];  Service: Cardiovascular;  Laterality: N/A;  . Vocal cord  splint  2005    Outpatient Medications Prior to Visit  Medication Sig Dispense Refill  . ALPRAZolam (XANAX) 0.25 MG tablet Take 0.25 mg by mouth at bedtime as needed for sleep.     Marland Kitchen atorvastatin (LIPITOR) 20 MG tablet Take 1 tablet (20 mg total) by mouth daily. 30 tablet 5  . Calcium Carbonate-Vitamin D 500-125 MG-UNIT TABS Take 1 tablet by mouth daily.    Marland Kitchen FLUoxetine (PROZAC) 20 MG capsule Take 20 mg by mouth daily.     . fluticasone (FLONASE) 50 MCG/ACT nasal spray USE 1 SPRAY IN EACH NOSTRIL DAILY (Patient taking differently: Place 1 spray into both nostrils daily. USE 1 SPRAY IN EACH NOSTRIL DAILY) 16 g 5  . metoprolol succinate (TOPROL-XL) 50 MG 24 hr tablet TAKE 1 TABLET BY MOUTH DAILY WITH OR IMMEDIATELY FOLLOWING A MEAL (Patient taking differently: Take 50 mg by mouth daily.) 90 tablet 1  . valACYclovir (VALTREX) 1000 MG tablet Take 1 tablet by mouth daily as needed (cold sores).     [name] 20 MG TABS tablet TAKE 1 TABLET BY MOUTH DAILY WITH SUPPER 90 tablet 0  . gabapentin (NEURONTIN) 100 MG capsule Take 1 capsule (100 mg total) by mouth 3 (three) times daily. 90 capsule 0   No facility-administered medications prior to visit.     Allergies:   Erythromycin and Sulfa antibiotics   Social History   Socioeconomic History  . Marital status: Widowed    Spouse name: Not on file  . Number of children: Not on file  . Years of education: Not on file  . Highest education level: Not on file  Occupational History  . Not on file  Tobacco Use  . Smoking status: Never Smoker  . Smokeless tobacco: Never Used  Vaping Use  . Vaping Use: Never used  Substance and Sexual Activity  . Alcohol use: Not Currently  . Drug use: Never  . Sexual activity: Not Currently    Birth control/protection: None  Other Topics Concern  . Not on file  Social History Narrative  . Not on file   Social Determinants of Health   Financial Resource Strain: Not on file  Food Insecurity: Not on file   Transportation Needs: Not on file  Physical Activity: Not on file  Stress: Not on file  Social Connections: Not on file     Family History:  The patient's family history includes Alcohol abuse in her paternal grandfather and sister; Cancer in her maternal grandmother; Coronary artery disease in her mother; Depression in her father, mother, and sister; Diabetes in her father, maternal grandfather, and paternal uncle; Heart disease in her father, maternal grandfather, mother, and paternal grandfather; Hypertension in her father, mother, and sister.   ROS:   Please see the history of present illness.    ROS All other systems are reviewed and are negative.   PHYSICAL EXAM:   VS:  BP 123/76   Pulse 71   Ht 5\' 7"  (1.702 m)   Wt 145 lb (65.8 kg)   SpO2 100%   BMI 22.71  kg/m      General: Alert, oriented x3, no distress, well-healed pacemaker site Head: no evidence of trauma, PERRL, EOMI, no exophtalmos or lid lag, no myxedema, no xanthelasma; normal ears, nose and oropharynx Neck: normal jugular venous pulsations and no hepatojugular reflux; brisk carotid pulses without delay and no carotid bruits Chest: clear to auscultation, no signs of consolidation by percussion or palpation, normal fremitus, symmetrical and full respiratory excursions Cardiovascular: normal position and quality of the apical impulse, regular rhythm, normal first and second heart sounds, no murmurs, rubs or gallops Abdomen: no tenderness or distention, no masses by palpation, no abnormal pulsatility or arterial bruits, normal bowel sounds, no hepatosplenomegaly Extremities: Orthopedic boot right foot; no clubbing, cyanosis or edema; 2+ radial, ulnar and brachial pulses bilaterally; 2+ right femoral, posterior tibial and dorsalis pedis pulses; 2+ left femoral, posterior tibial and dorsalis pedis pulses; no subclavian or femoral bruits Neurological: grossly nonfocal Psych: Normal mood and affect   Wt Readings from Last  3 Encounters:  07/26/20 145 lb (65.8 kg)  04/26/20 151 lb (68.5 kg)  04/22/20 151 lb (68.5 kg)      Studies/Labs Reviewed:  CT angiogram May 2021  IMPRESSION: 1. Atherosclerosis is mild for age in the neck, with no extracranial carotid or vertebral artery stenosis. Mild generalized arterial tortuosity.  2. Bilateral proximal subclavian artery plaque without significant stenosis. 3. Cervical spine and TMJ degeneration.  MRI of brain 10/28/2019  IMPRESSION: No acute or subacute finding. Chronic small-vessel ischemic changes affecting the brain, advanced in the pons and moderate in the cerebral hemispheric white matter.   EKG: Intracardiac electrogram shows mostly atrial paced, ventricular paced rhythm with frequent PVCs today.  Recent Labs: 08/21/2019: TSH 2.370 08/22/2019: ALT 14 04/22/2020: BUN 21; Creatinine, Ser 1.20; Hemoglobin 12.5; Platelets 253; Potassium 5.4; Sodium 142   Lipid Panel    Component Value Date/Time   CHOL 223 (H) 08/21/2019 2100   CHOL 215 (H) 03/28/2018 0858   TRIG 55 08/21/2019 2100   HDL 71 08/21/2019 2100   HDL 61 03/28/2018 0858   CHOLHDL 3.1 08/21/2019 2100   VLDL 11 08/21/2019 2100   LDLCALC 141 (H) 08/21/2019 2100   LDLCALC 134 (H) 03/28/2018 0858      ASSESSMENT:    1. CHB (complete heart block) ([place])   2. SSS (sick sinus syndrome) ([place])   3. NSVT (nonsustained ventricular tachycardia) ([place])   4. Pacemaker - Medtronic dual-chamber implanted 2010/generator change 2022   5. Paroxysmal atrial fibrillation ([facility])   6. Long term (current) use of anticoagulants   7. Hypercholesterolemia      PLAN:  In order of problems listed above:  1. CHB: Early onset at age 65 or so.  Both her father and her sister received pacemakers in their 35s. 2. SSS: Over time she has progressed from relatively infrequent atrial pacing over 80% atrial pacing despite taking only a low-dose of beta-blocker.  Her heart rate histogram is very blunted despite  the fact that she is quite active.  She probably has developed some sinus node dysfunction as well.  This may explain her exertional fatigue. 3. NSVT: She continues to have frequent PVCs with very infrequent nonsustained VT which has always been asymptomatic. 4. PPM: Device programmed DDDR to help compensate the apparent chronotropic incompetence.  Reassess for appropriateness of sensor settings in 3-6 months. 5. AFib: CHA2DS2-VASc score 2 (age and gender).  Overall burden of arrhythmia is low at only about 0.1%.  On anticoagulation. 6. Anticoagulation: On Xarelto.  No serious injuries or bleeding problems. 7. HLP: On statin, Ideal LDL target under 70.  Appeared to have some degree of vertebral artery stenosis on CT angiography of the neck, but calcium score was 0 and she did not have any CAD on coronary CT angiography.  Plans to have labs with her primary care provider next month.    Medication Adjustments/Labs and Tests Ordered: Current medicines are reviewed at length with the patient today.  Concerns regarding medicines are outlined above.  Medication changes, Labs and Tests ordered today are listed in the Patient Instructions below. Patient Instructions  Medication Instructions:  No changes *If you need a refill on your cardiac medications before your next appointment, please call your pharmacy*   Lab Work: None ordered If you have labs (blood work) drawn today and your tests are completely normal, you will receive your results only by: Marland Kitchen MyChart Message (if you have MyChart) OR . A paper copy in the mail If you have any lab test that is abnormal or we need to change your treatment, we will call you to review the results.   Testing/Procedures: None ordered   Follow-Up: At [facility], you and your health needs are our priority.  As part of our continuing mission to provide you with exceptional heart care, we have created designated Provider Care Teams.  These Care Teams include  your primary Cardiologist (physician) and Advanced Practice Providers (APPs -  Physician Assistants and Nurse Practitioners) who all work together to provide you with the care you need, when you need it.  We recommend signing up for the patient portal called "MyChart".  Sign up information is provided on this After Visit Summary.  MyChart is used to connect with patients for Virtual Visits (Telemedicine).  Patients are able to view lab/test results, encounter notes, upcoming appointments, etc.  Non-urgent messages can be sent to your provider as well.   To learn more about what you can do with MyChart, go to [email or web address].    Your next appointment:   6 month(s)  The format for your next appointment:   In Person  Provider:   Sanda Klein, MD        Signed, Sanda Klein, MD  07/28/2020 9:01 PM    [facility] [address], [place], [place]  [address] Phone: [phone]; Fax: [phone]

## 2020-07-26 NOTE — Patient Instructions (Signed)

## 2020-07-27 ENCOUNTER — Ambulatory Visit (INDEPENDENT_AMBULATORY_CARE_PROVIDER_SITE_OTHER): Payer: PPO

## 2020-07-27 DIAGNOSIS — I495 Sick sinus syndrome: Secondary | ICD-10-CM

## 2020-07-28 ENCOUNTER — Encounter: Payer: Self-pay | Admitting: Cardiovascular Disease

## 2020-07-28 DIAGNOSIS — I495 Sick sinus syndrome: Secondary | ICD-10-CM | POA: Insufficient documentation

## 2020-08-03 LAB — CUP PACEART REMOTE DEVICE CHECK
Battery Remaining Longevity: 118 mo
Battery Voltage: 3.18 V
Brady Statistic AP VP Percent: 93.71 %
Brady Statistic AP VS Percent: 0.13 %
Brady Statistic AS VP Percent: 5.21 %
Brady Statistic AS VS Percent: 0.95 %
Brady Statistic RA Percent Paced: 94.27 %
Brady Statistic RV Percent Paced: 98.92 %
Date Time Interrogation Session: 20220502081330
Implantable Lead Implant Date: 20100505
Implantable Lead Implant Date: 20100505
Implantable Lead Location: 753859
Implantable Lead Location: 753860
Implantable Lead Model: 5076
Implantable Lead Model: 5076
Implantable Pulse Generator Implant Date: 20220124
Lead Channel Impedance Value: 361 Ohm
Lead Channel Impedance Value: 380 Ohm
Lead Channel Impedance Value: 399 Ohm
Lead Channel Impedance Value: 418 Ohm
Lead Channel Pacing Threshold Amplitude: 0.5 V
Lead Channel Pacing Threshold Amplitude: 0.625 V
Lead Channel Pacing Threshold Pulse Width: 0.4 ms
Lead Channel Pacing Threshold Pulse Width: 0.4 ms
Lead Channel Sensing Intrinsic Amplitude: 0.75 mV
Lead Channel Sensing Intrinsic Amplitude: 0.75 mV
Lead Channel Setting Pacing Amplitude: 2 V
Lead Channel Setting Pacing Amplitude: 2.5 V
Lead Channel Setting Pacing Pulse Width: 0.4 ms
Lead Channel Setting Sensing Sensitivity: 1.2 mV

## 2020-08-16 ENCOUNTER — Ambulatory Visit (INDEPENDENT_AMBULATORY_CARE_PROVIDER_SITE_OTHER): Payer: PPO | Admitting: Podiatry

## 2020-08-16 ENCOUNTER — Other Ambulatory Visit: Payer: Self-pay

## 2020-08-16 ENCOUNTER — Ambulatory Visit (INDEPENDENT_AMBULATORY_CARE_PROVIDER_SITE_OTHER): Payer: PPO

## 2020-08-16 DIAGNOSIS — S92911A Unspecified fracture of right toe(s), initial encounter for closed fracture: Secondary | ICD-10-CM | POA: Diagnosis not present

## 2020-08-16 NOTE — Progress Notes (Signed)
   HPI: 66 y.o. female presenting today for follow-up evaluation of fractures to the right forefoot.  Overall she states that she is feeling much better.  She does have some minimal achiness occasionally.  DOI, 07/01/2020.  Patient states that she was walking her dog when she slipped on a rock in her toes bent backwards.  She was able to walk home however after she sat down for a while and tried to stand back up she had severe pain and tenderness to her right forefoot.  She has a history of right foot surgery August 2021 and she was concerned that she is possibly most of her surgery.  Prior to the incident she was having no pain and ambulating just fine.  She presents for further treatment evaluation  Past Medical History:  Diagnosis Date  . Allergic rhinitis   . Asthmatic bronchitis   . Complete heart block ([place])   . GERD (gastroesophageal reflux disease)   . MRSA (methicillin resistant Staphylococcus aureus) 2013   left buttock  . Nonsustained ventricular tachycardia ([place])   . 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 Vaginitis   . Vocal cord paresis      Physical Exam: General: The patient is alert and oriented x3 in no acute distress.  Dermatology: Skin is warm, dry and supple bilateral lower extremities. Negative for open lesions or macerations.  Vascular: Palpable pedal pulses bilaterally.  There is some ecchymosis and edema localized around the right forefoot.  Capillary refill within normal limits.  Neurological: Epicritic and protective threshold grossly intact bilaterally.   Musculoskeletal Exam: No pedal deformities noted.  Minimal tenderness to palpation along the second and third toes of the right foot  Radiographic Exam:  Normal osseous mineralization.  Fractures appear stable with routine healing.  Modest improvement compared to prior exams.  Orthopedic hardware is intact to the first metatarsal.  No fractures identified along the first ray  with exception of a small subtle cortical irregularity intra-articular at the IPJ of the hallux.  This may be concerning for possible fracture.  Assessment: 1.  Comminuted fracture base of the second and third proximal phalanx right foot, closed, nondisplaced; with routine healing   Plan of Care:  1. Patient evaluated. X-Rays reviewed.  2.  Patient may discontinue the cam boot.  Recommend good stability sneakers 3.  Slowly increase activity. 4.  Return to clinic as needed     Edrick Kins, DPM [facility]  Dr. Edrick Kins, DPM    [address], [place] [address]                Office [phone]  Fax [phone]

## 2020-08-18 NOTE — Progress Notes (Signed)
Remote pacemaker transmission.   

## 2020-08-26 ENCOUNTER — Other Ambulatory Visit: Payer: Self-pay | Admitting: Cardiovascular Disease

## 2020-09-20 ENCOUNTER — Other Ambulatory Visit: Payer: Self-pay

## 2020-09-20 DIAGNOSIS — I480 Paroxysmal atrial fibrillation: Secondary | ICD-10-CM

## 2020-09-20 NOTE — Telephone Encounter (Signed)
Pt requesting refill of xarelto 20mg  to [facility] when they only qualify for the 15mg  will route to pharmd pool for further assesment. 32f, 65.8kg, scr 1.2 04/22/20, lovw/croitoru 07/26/20, ccr 47.9

## 2020-09-20 NOTE — Telephone Encounter (Signed)
Prescription refill request for Xarelto received.  Indication: A Fib Last office visit: 07/26/20 Weight: 145 Age: 65 Scr: 1.2 CrCl: 48 mL/min  Lab order previously placed in March but patient had not had drawn yet.  Called patient to remind her. She states she will come in tomorrow.

## 2020-09-21 ENCOUNTER — Other Ambulatory Visit: Payer: Self-pay

## 2020-09-21 DIAGNOSIS — I480 Paroxysmal atrial fibrillation: Secondary | ICD-10-CM

## 2020-09-21 LAB — BASIC METABOLIC PANEL
BUN/Creatinine Ratio: 15 (ref 12–28)
BUN: 16 mg/dL (ref 8–27)
CO2: 26 mmol/L (ref 20–29)
Calcium: 9.9 mg/dL (ref 8.7–10.3)
Chloride: 102 mmol/L (ref 96–106)
Creatinine, Ser: 1.07 mg/dL — ABNORMAL HIGH (ref 0.57–1.00)
Glucose: 127 mg/dL — ABNORMAL HIGH (ref 65–99)
Potassium: 5.5 mmol/L — ABNORMAL HIGH (ref 3.5–5.2)
Sodium: 140 mmol/L (ref 134–144)
eGFR: 57 mL/min/{1.73_m2} — ABNORMAL LOW (ref >59)

## 2020-09-22 MED ORDER — RIVAROXABAN 20 MG PO TABS: 20.0000 mg | ORAL_TABLET | Freq: Every day | ORAL | 1 refills | Status: DC

## 2020-09-22 NOTE — Telephone Encounter (Signed)
Prescription refill request for Xarelto 20mg  received.with ccr of 33.8 pt qualifies only for 15mg . Will route to pharmd pool.  Indication: A Fib Last office visit w/croitoru: 07/26/20 Weight: 145lb Age: 48yof Scr: 1.7 09/21/20 CrCl: 33.8 mL/min

## 2020-09-22 NOTE — Telephone Encounter (Signed)
SCr from yesterday is 1.07, not 1.7. Her CrCl is 54, she's on the correct dose of Xarelto 20mg  daily, please refill

## 2020-09-29 ENCOUNTER — Encounter: Payer: Self-pay | Admitting: *Deleted

## 2020-09-29 ENCOUNTER — Other Ambulatory Visit: Payer: Self-pay | Admitting: Cardiovascular Disease

## 2020-09-30 DIAGNOSIS — Z6823 Body mass index (BMI) 23.0-23.9, adult: Secondary | ICD-10-CM | POA: Diagnosis not present

## 2020-09-30 DIAGNOSIS — Z1231 Encounter for screening mammogram for malignant neoplasm of breast: Secondary | ICD-10-CM | POA: Diagnosis not present

## 2020-09-30 DIAGNOSIS — N952 Postmenopausal atrophic vaginitis: Secondary | ICD-10-CM | POA: Diagnosis not present

## 2020-09-30 DIAGNOSIS — F419 Anxiety disorder, unspecified: Secondary | ICD-10-CM | POA: Diagnosis not present

## 2020-09-30 DIAGNOSIS — F329 Major depressive disorder, single episode, unspecified: Secondary | ICD-10-CM | POA: Diagnosis not present

## 2020-09-30 DIAGNOSIS — Z01419 Encounter for gynecological examination (general) (routine) without abnormal findings: Secondary | ICD-10-CM | POA: Diagnosis not present

## 2020-09-30 DIAGNOSIS — M8580 Other specified disorders of bone density and structure, unspecified site: Secondary | ICD-10-CM | POA: Diagnosis not present

## 2020-10-25 DIAGNOSIS — M67911 Unspecified disorder of synovium and tendon, right shoulder: Secondary | ICD-10-CM | POA: Diagnosis not present

## 2020-10-26 ENCOUNTER — Ambulatory Visit (INDEPENDENT_AMBULATORY_CARE_PROVIDER_SITE_OTHER): Payer: PPO

## 2020-10-26 DIAGNOSIS — I495 Sick sinus syndrome: Secondary | ICD-10-CM | POA: Diagnosis not present

## 2020-10-26 LAB — CUP PACEART REMOTE DEVICE CHECK
Battery Remaining Longevity: 111 mo
Battery Voltage: 3.12 V
Brady Statistic AP VP Percent: 92.24 %
Brady Statistic AP VS Percent: 0.09 %
Brady Statistic AS VP Percent: 7.47 %
Brady Statistic AS VS Percent: 0.19 %
Brady Statistic RA Percent Paced: 92.21 %
Brady Statistic RV Percent Paced: 99.72 %
Date Time Interrogation Session: 20220725081340
Implantable Lead Implant Date: 20100505
Implantable Lead Implant Date: 20100505
Implantable Lead Location: 753859
Implantable Lead Location: 753860
Implantable Lead Model: 5076
Implantable Lead Model: 5076
Implantable Pulse Generator Implant Date: 20220124
Lead Channel Impedance Value: 323 Ohm
Lead Channel Impedance Value: 361 Ohm
Lead Channel Impedance Value: 380 Ohm
Lead Channel Impedance Value: 399 Ohm
Lead Channel Pacing Threshold Amplitude: 0.5 V
Lead Channel Pacing Threshold Amplitude: 0.75 V
Lead Channel Pacing Threshold Pulse Width: 0.4 ms
Lead Channel Pacing Threshold Pulse Width: 0.4 ms
Lead Channel Sensing Intrinsic Amplitude: 2.125 mV
Lead Channel Sensing Intrinsic Amplitude: 2.125 mV
Lead Channel Setting Pacing Amplitude: 2 V
Lead Channel Setting Pacing Amplitude: 2.5 V
Lead Channel Setting Pacing Pulse Width: 0.4 ms
Lead Channel Setting Sensing Sensitivity: 1.2 mV

## 2020-11-03 DIAGNOSIS — M6281 Muscle weakness (generalized): Secondary | ICD-10-CM | POA: Diagnosis not present

## 2020-11-03 DIAGNOSIS — M67911 Unspecified disorder of synovium and tendon, right shoulder: Secondary | ICD-10-CM | POA: Diagnosis not present

## 2020-11-10 DIAGNOSIS — F329 Major depressive disorder, single episode, unspecified: Secondary | ICD-10-CM | POA: Diagnosis not present

## 2020-11-17 ENCOUNTER — Other Ambulatory Visit: Payer: Self-pay | Admitting: Cardiovascular Disease

## 2020-11-17 NOTE — Telephone Encounter (Signed)
Rx(s) sent to pharmacy electronically.  

## 2020-11-19 NOTE — Progress Notes (Signed)
Remote pacemaker transmission.   

## 2020-12-03 DIAGNOSIS — M25511 Pain in right shoulder: Secondary | ICD-10-CM | POA: Diagnosis not present

## 2020-12-28 ENCOUNTER — Telehealth: Payer: Self-pay | Admitting: Cardiovascular Disease

## 2020-12-28 NOTE — Telephone Encounter (Signed)
STAT if HR is under 50 or over 120 (normal HR is 60-100 beats per minute)  What is your heart rate? 107 standing still. Started about last Wednesday   Do you have a log of your heart rate readings (document readings)? No   Do you have any other symptoms? Yesterday and today better. Patient also has a pacemaker.

## 2020-12-28 NOTE — Telephone Encounter (Signed)
Returned call to patient who states that Wednesday and Thursday of last week she had some chest pressure that she thought was heart burn. Patient states that during these episodes she noticed her heart rate was elevated in the upper 90's to low 100's. Patient reports episode this past Sunday at church while singing she had to stop due to shortness of breath and checked her HR and it was 107. Patient states that yesterday and today her HR has been in her usual 70's-80's with HR being in the 90's with exertion. Patient denies any symptoms at this time. Advised patient to send in remote transmission from [place] and that I would forward this message to Device clinic for them to review and advise since Dr. C is out of office. Patient verbalized understanding and will send in transmission now. Made patient aware of ED precautions should new or worsening symptoms develop. Patient verbalized understanding.

## 2020-12-29 NOTE — Telephone Encounter (Signed)
Manual transmission received 6:05pm 9/27.  Normal device function.  Presenting rate APVP 70.  No recent arhythmia noted.  HR Histogram unchanged from previous.  Nothing evident on device to explain patient symptoms.

## 2020-12-29 NOTE — Telephone Encounter (Signed)
Patient has been called and made aware. She will call back if anything further is needed or if she has another episode.

## 2021-01-05 DIAGNOSIS — F418 Other specified anxiety disorders: Secondary | ICD-10-CM | POA: Diagnosis not present

## 2021-01-05 DIAGNOSIS — Z7901 Long term (current) use of anticoagulants: Secondary | ICD-10-CM | POA: Diagnosis not present

## 2021-01-05 DIAGNOSIS — Z0000 Encounter for general adult medical examination without abnormal findings: Secondary | ICD-10-CM | POA: Diagnosis not present

## 2021-01-05 DIAGNOSIS — Z950 Presence of cardiac pacemaker: Secondary | ICD-10-CM | POA: Diagnosis not present

## 2021-01-05 DIAGNOSIS — Z23 Encounter for immunization: Secondary | ICD-10-CM | POA: Diagnosis not present

## 2021-01-05 DIAGNOSIS — I480 Paroxysmal atrial fibrillation: Secondary | ICD-10-CM | POA: Diagnosis not present

## 2021-01-05 DIAGNOSIS — Z136 Encounter for screening for cardiovascular disorders: Secondary | ICD-10-CM | POA: Diagnosis not present

## 2021-01-25 ENCOUNTER — Ambulatory Visit (INDEPENDENT_AMBULATORY_CARE_PROVIDER_SITE_OTHER): Payer: PPO

## 2021-01-25 DIAGNOSIS — I495 Sick sinus syndrome: Secondary | ICD-10-CM | POA: Diagnosis not present

## 2021-01-25 LAB — CUP PACEART REMOTE DEVICE CHECK
Battery Remaining Longevity: 111 mo
Battery Voltage: 3.05 V
Brady Statistic AP VP Percent: 80.69 %
Brady Statistic AP VS Percent: 0.16 %
Brady Statistic AS VP Percent: 18.23 %
Brady Statistic AS VS Percent: 0.92 %
Brady Statistic RA Percent Paced: 81.23 %
Brady Statistic RV Percent Paced: 98.92 %
Date Time Interrogation Session: 20221024210939
Implantable Lead Implant Date: 20100505
Implantable Lead Implant Date: 20100505
Implantable Lead Location: 753859
Implantable Lead Location: 753860
Implantable Lead Model: 5076
Implantable Lead Model: 5076
Implantable Pulse Generator Implant Date: 20220124
Lead Channel Impedance Value: 361 Ohm
Lead Channel Impedance Value: 380 Ohm
Lead Channel Impedance Value: 418 Ohm
Lead Channel Impedance Value: 437 Ohm
Lead Channel Pacing Threshold Amplitude: 0.625 V
Lead Channel Pacing Threshold Amplitude: 0.75 V
Lead Channel Pacing Threshold Pulse Width: 0.4 ms
Lead Channel Pacing Threshold Pulse Width: 0.4 ms
Lead Channel Sensing Intrinsic Amplitude: 3 mV
Lead Channel Sensing Intrinsic Amplitude: 3 mV
Lead Channel Setting Pacing Amplitude: 2 V
Lead Channel Setting Pacing Amplitude: 2.5 V
Lead Channel Setting Pacing Pulse Width: 0.4 ms
Lead Channel Setting Sensing Sensitivity: 1.2 mV

## 2021-02-01 NOTE — Progress Notes (Signed)
Remote pacemaker transmission.   

## 2021-02-07 ENCOUNTER — Ambulatory Visit (INDEPENDENT_AMBULATORY_CARE_PROVIDER_SITE_OTHER): Payer: PPO | Admitting: Cardiovascular Disease

## 2021-02-07 ENCOUNTER — Encounter: Payer: Self-pay | Admitting: Cardiovascular Disease

## 2021-02-07 ENCOUNTER — Other Ambulatory Visit: Payer: Self-pay

## 2021-02-07 VITALS — BP 136/79 | HR 73 | Ht 67.0 in | Wt 147.4 lb

## 2021-02-07 DIAGNOSIS — Z7901 Long term (current) use of anticoagulants: Secondary | ICD-10-CM

## 2021-02-07 DIAGNOSIS — I480 Paroxysmal atrial fibrillation: Secondary | ICD-10-CM

## 2021-02-07 DIAGNOSIS — I442 Atrioventricular block, complete: Secondary | ICD-10-CM | POA: Diagnosis not present

## 2021-02-07 DIAGNOSIS — I495 Sick sinus syndrome: Secondary | ICD-10-CM | POA: Diagnosis not present

## 2021-02-07 DIAGNOSIS — E7800 Pure hypercholesterolemia, unspecified: Secondary | ICD-10-CM | POA: Diagnosis not present

## 2021-02-07 DIAGNOSIS — Z950 Presence of cardiac pacemaker: Secondary | ICD-10-CM | POA: Diagnosis not present

## 2021-02-07 DIAGNOSIS — I4729 Other ventricular tachycardia: Secondary | ICD-10-CM | POA: Diagnosis not present

## 2021-02-07 NOTE — Progress Notes (Signed)
Patient ID: Brandy [name], female   DOB: January 23, 1956, 65 y.o.   MRN: [id number]     Cardiology Office Note    Date:  02/07/2021   ID:  [name], DOB [birth year], MRN [id number]  PCP:  Harlan Stains, MD  Cardiologist:   Sanda Klein, MD   Chief Complaint  Patient presents with   Pacemaker Problem    History of Present Illness:  Brandy [name] is a 65 y.o. female with complete heart block (initial device 2010, pacemaker generator change out in January 2022 - Medtronic Azure, atrial and ventricular leads are both Medtronic 5076).  She has subsequently developed episodes of asymptomatic ventricular tachycardia (nonsustained) and paroxysmal atrial fibrillation, detected by her device.  She has also developed increased need for atrial pacing consistent with sick sinus syndrome so rate response was turned on at her last appointment.  She had pacemaker syndrome when her old device reached ERI (dizziness and fatigue and even intrascapular pain), all of which resolved when her pacemaker generator was changed out.  She feels that she has improved after the rate response sensor was turned on at her last appointment.  She is quite active, about 6 hours a day according to her device.  The heart rate histogram distribution looks a lot less blunted.  She has not had any significant episodes of atrial fibrillation since the last device check, although her device has recorded a couple of episodes of atrial tachycardia.  She has over 98% ventricular pacing (fairly frequent monomorphic PVCs , only 1 very brief episode of nonsustained VT), over 80% atrial pacing.  The patient specifically denies any chest pain at rest exertion, dyspnea at rest or with exertion, orthopnea, paroxysmal nocturnal dyspnea, syncope, palpitations, focal neurological deficits, intermittent claudication, lower extremity edema, unexplained weight gain, cough, hemoptysis or wheezing.  She has not had any more falls or serious  injuries.  She had an unusual episode of loss of consciousness in May 2021 facial injuries, tongue biting and a prolonged period of disorientation. The next day she had documented orthostatic hypotension with her systolic blood pressure dropping from 150 down to 70. She was noted to have stenosis of the vertebral arteries on CT angiography and atorvastatin was also started.  She was already on anticoagulation for asymptomatic paroxysmal atrial fibrillation detected by her pacemaker, at that time.  She subsequently underwent an MRI that did not show any major brain abnormalities.  LV EF is 50-55%, slightly low likely due to RV pacing.  No valvular abnormalities were seen.  She has a history of normal coronary arteries and a calcium score of 0 on a coronary CT angiogram performed in July 2019.   Past Medical History:  Diagnosis Date   Allergic rhinitis    Asthmatic bronchitis    Complete heart block (HCC)    GERD (gastroesophageal reflux disease)    MRSA (methicillin resistant Staphylococcus aureus) 2013   left buttock   Nonsustained ventricular tachycardia    Presence of permanent cardiac pacemaker 08-05-08   Medtronic Adapta   Vaginitis    Vocal cord paresis     Past Surgical History:  Procedure Laterality Date   ACROMIO-CLAVICULAR JOINT REPAIR Left 12/10/2014   Procedure: LEFT CORACOCLAVICULAR RECONSTRUCTION WITH  ZIPLOOP;  Surgeon: Tania Ade, MD;  Location: [facility];  Service: Orthopedics;  Laterality: Left;   APPENDECTOMY  05-2010   COLON SURGERY N/A    Phreesia 01/06/2020   DILATION AND CURETTAGE OF UTERUS  1978  FRACTURE SURGERY N/A    Phreesia 01/06/2020   HAND SURGERY     PACEMAKER INSERTION  08/05/08   Medtronic Adapta   PPM GENERATOR CHANGEOUT N/A 04/26/2020   Procedure: PPM GENERATOR CHANGEOUT;  Surgeon: Sanda Klein, MD;  Location: [facility];  Service: Cardiovascular;  Laterality: N/A;   Vocal cord splint  2005    Outpatient Medications  Prior to Visit  Medication Sig Dispense Refill   ALPRAZolam (XANAX) 0.25 MG tablet Take 0.25 mg by mouth at bedtime as needed for sleep.      atorvastatin (LIPITOR) 20 MG tablet TAKE 1 TABLET BY MOUTH DAILY 90 tablet 1   Calcium Carbonate-Vitamin D 500-125 MG-UNIT TABS Take 1 tablet by mouth daily.     fluticasone (FLONASE) 50 MCG/ACT nasal spray USE 1 SPRAY IN EACH NOSTRIL DAILY (Patient taking differently: Place 1 spray into both nostrils daily. USE 1 SPRAY IN EACH NOSTRIL DAILY) 16 g 5   metoprolol succinate (TOPROL-XL) 50 MG 24 hr tablet TAKE 1 TABLET BY MOUTH DAILY WITH OR IMMEDIATELY FOLLOWING A MEAL 90 tablet 1   rivaroxaban (XARELTO) 20 MG TABS tablet Take 1 tablet (20 mg total) by mouth daily with supper. 90 tablet 1   sertraline (ZOLOFT) 50 MG tablet Take 50 mg by mouth daily.     triamcinolone cream (KENALOG) 0.1 % as needed.     valACYclovir (VALTREX) 1000 MG tablet Take 1 tablet by mouth daily as needed (cold sores).      FLUoxetine (PROZAC) 20 MG capsule Take 20 mg by mouth daily.      No facility-administered medications prior to visit.     Allergies:   Erythromycin and Sulfa antibiotics   Social History   Socioeconomic History   Marital status: Widowed    Spouse name: Not on file   Number of children: Not on file   Years of education: Not on file   Highest education level: Not on file  Occupational History   Not on file  Tobacco Use   Smoking status: Never   Smokeless tobacco: Never  Vaping Use   Vaping Use: Never used  Substance and Sexual Activity   Alcohol use: Not Currently   Drug use: Never   Sexual activity: Not Currently    Birth control/protection: None  Other Topics Concern   Not on file  Social History Narrative   Not on file   Social Determinants of Health   Financial Resource Strain: Not on file  Food Insecurity: Not on file  Transportation Needs: Not on file  Physical Activity: Not on file  Stress: Not on file  Social Connections: Not on  file     Family History:  The patient's family history includes Alcohol abuse in her paternal grandfather and sister; Cancer in her maternal grandmother; Coronary artery disease in her mother; Depression in her father, mother, and sister; Diabetes in her father, maternal grandfather, and paternal uncle; Heart disease in her father, maternal grandfather, mother, and paternal grandfather; Hypertension in her father, mother, and sister.   ROS:   Please see the history of present illness.    ROS All other systems are reviewed and are negative.   PHYSICAL EXAM:   VS:  BP 136/79 (BP Location: Left Arm, Patient Position: Sitting, Cuff Size: Normal)   Pulse 73   Ht 5\' 7"  (1.702 m)   Wt 147 lb 6.4 oz (66.9 kg)   SpO2 98%   BMI 23.09 kg/m      General:  Alert, oriented x3, no distress, appears lean and fit Head: no evidence of trauma, PERRL, EOMI, no exophtalmos or lid lag, no myxedema, no xanthelasma; normal ears, nose and oropharynx Neck: normal jugular venous pulsations and no hepatojugular reflux; brisk carotid pulses without delay and no carotid bruits Chest: clear to auscultation, no signs of consolidation by percussion or palpation, normal fremitus, symmetrical and full respiratory excursions Cardiovascular: normal position and quality of the apical impulse, regular rhythm, normal first and paradoxically split second heart sounds, no murmurs, rubs or gallops Abdomen: no tenderness or distention, no masses by palpation, no abnormal pulsatility or arterial bruits, normal bowel sounds, no hepatosplenomegaly Extremities: no clubbing, cyanosis or edema; 2+ radial, ulnar and brachial pulses bilaterally; 2+ right femoral, posterior tibial and dorsalis pedis pulses; 2+ left femoral, posterior tibial and dorsalis pedis pulses; no subclavian or femoral bruits Neurological: grossly nonfocal Psych: Normal mood and affect   Wt Readings from Last 3 Encounters:  02/07/21 147 lb 6.4 oz (66.9 kg)   07/26/20 145 lb (65.8 kg)  04/26/20 151 lb (68.5 kg)      Studies/Labs Reviewed:  CT angiogram May 2021  IMPRESSION: 1. Atherosclerosis is mild for age in the neck, with no extracranial carotid or vertebral artery stenosis. Mild generalized arterial tortuosity. 2. Bilateral proximal subclavian artery plaque without significant stenosis. 3. Cervical spine and TMJ degeneration.  MRI of brain 10/28/2019  IMPRESSION: No acute or subacute finding. Chronic small-vessel ischemic changes affecting the brain, advanced in the pons and moderate in the cerebral hemispheric white matter.     EKG: Ordered today, personally reviewed AV sequentially paced rhythm  Recent Labs: 04/22/2020: Hemoglobin 12.5; Platelets 253 09/21/2020: BUN 16; Creatinine, Ser 1.07; Potassium 5.5; Sodium 140   Lipid Panel    Component Value Date/Time   CHOL 223 (H) 08/21/2019 2100   CHOL 215 (H) 03/28/2018 0858   TRIG 55 08/21/2019 2100   HDL 71 08/21/2019 2100   HDL 61 03/28/2018 0858   CHOLHDL 3.1 08/21/2019 2100   VLDL 11 08/21/2019 2100   LDLCALC 141 (H) 08/21/2019 2100   LDLCALC 134 (H) 03/28/2018 0858      ASSESSMENT:    1. CHB (complete heart block) ([place])   2. SSS (sick sinus syndrome) ([place])   3. NSVT (nonsustained ventricular tachycardia)   4. Pacemaker - Medtronic dual-chamber implanted 2010   5. Paroxysmal atrial fibrillation (HCC)   6. Long term (current) use of anticoagulants   7. Hypercholesterolemia      PLAN:  In order of problems listed above:  CHB: Pacemaker dependent.  Early onset at age 51 or so.  Both her father and her sister received pacemakers in their 13s. SSS: Improved symptoms and improved heart rate histogram after addition of rate responsive pacing. NSVT: Very rare and asymptomatic nonsustained VT recorded by her device, only once in the last 6 months PPM: Current heart rate histogram suggest that she has appropriate sensor settings.  No changes made to device  settings.  After generator change out her pacemaker system is now MRI conditional. AFib: CHA2DS2-VASc score 2 (age and gender).  Overall burden of atrial fibrillation remains very low at, well under 1%, last meaningful event occurred more than 6 months ago. Anticoagulation: On Xarelto.  No further falls or bleeding problems. HLP: On statin, ideally LDL target under 70.  Appeared to have some degree of vertebral artery stenosis on CT angiography of the neck, but calcium score was 0 and she did not have any CAD on  coronary CT angiography.  Labs followed by PCP    Medication Adjustments/Labs and Tests Ordered: Current medicines are reviewed at length with the patient today.  Concerns regarding medicines are outlined above.  Medication changes, Labs and Tests ordered today are listed in the Patient Instructions below. Patient Instructions  Medication Instructions:  No changes *If you need a refill on your cardiac medications before your next appointment, please call your pharmacy*   Lab Work: None ordered If you have labs (blood work) drawn today and your tests are completely normal, you will receive your results only by: Welcome (if you have MyChart) OR A paper copy in the mail If you have any lab test that is abnormal or we need to change your treatment, we will call you to review the results.   Testing/Procedures: None ordered   Follow-Up: At [facility], you and your health needs are our priority.  As part of our continuing mission to provide you with exceptional heart care, we have created designated Provider Care Teams.  These Care Teams include your primary Cardiologist (physician) and Advanced Practice Providers (APPs -  Physician Assistants and Nurse Practitioners) who all work together to provide you with the care you need, when you need it.  We recommend signing up for the patient portal called "MyChart".  Sign up information is provided on this After Visit Summary.   MyChart is used to connect with patients for Virtual Visits (Telemedicine).  Patients are able to view lab/test results, encounter notes, upcoming appointments, etc.  Non-urgent messages can be sent to your provider as well.   To learn more about what you can do with MyChart, go to [email or web address].    Your next appointment:   12 month(s)  The format for your next appointment:   In Person  Provider:   Sanda Klein, MD         Signed, Sanda Klein, MD  02/07/2021 4:40 PM    [facility] [address], [place], [place]  [address] Phone: [phone]; Fax: [phone]

## 2021-02-07 NOTE — Patient Instructions (Signed)

## 2021-03-07 ENCOUNTER — Other Ambulatory Visit: Payer: Self-pay | Admitting: Cardiovascular Disease

## 2021-03-07 DIAGNOSIS — I480 Paroxysmal atrial fibrillation: Secondary | ICD-10-CM

## 2021-03-07 NOTE — Telephone Encounter (Signed)
Prescription refill request for Xarelto received.  Indication:Afib Last office visit:11/22 Weight:66.9 kg Age:65 Scr:1.0 CrCl:57.65 ml/min  Prescription refilled

## 2021-03-15 DIAGNOSIS — Z7901 Long term (current) use of anticoagulants: Secondary | ICD-10-CM | POA: Diagnosis not present

## 2021-03-15 DIAGNOSIS — Z86010 Personal history of colonic polyps: Secondary | ICD-10-CM | POA: Diagnosis not present

## 2021-03-15 DIAGNOSIS — I480 Paroxysmal atrial fibrillation: Secondary | ICD-10-CM | POA: Diagnosis not present

## 2021-03-15 DIAGNOSIS — Z950 Presence of cardiac pacemaker: Secondary | ICD-10-CM | POA: Diagnosis not present

## 2021-03-16 ENCOUNTER — Telehealth: Payer: Self-pay

## 2021-03-16 NOTE — Telephone Encounter (Signed)
Clinical pharmacist to review Xarelto 

## 2021-03-16 NOTE — Telephone Encounter (Signed)
° °  Pre-operative Risk Assessment    Patient Name: Brandy [name]  DOB: 1955/06/28 MRN: [id number]      Request for Surgical Clearance      Date of Surgery:  Clearance 05/20/21                               Surgeon: Dr.Karki Surgeon's Group or Practice Name:  [facility] Phone number:  [phone] Fax number:  [phone]   Type of Clearance Requested:   Pharmacy  Hold Xarelto   Type of Anesthesia:  Not Indicated   Additional requests/questions:  Please advise surgeon/provider what medications should be held.  Evalee Mutton   03/16/2021, 8:51 AM

## 2021-03-18 NOTE — Telephone Encounter (Signed)
Please finish clearance request.  What type of procedure is being done?

## 2021-03-21 NOTE — Telephone Encounter (Signed)
° °  Pre-operative Risk Assessment    Patient Name: Brandy [name]  DOB: November 20, 1955 MRN: [id number]      Request for Surgical Clearance    Procedure:   COLONOSCOPY  Date of Surgery:  Clearance 05/20/21                                 Surgeon:  DR. [facility] Surgeon's Group or Practice Name:  [facility] Phone number:  [phone] Fax number:  [phone]   Type of Clearance Requested:   - Medical  - Pharmacy:  Hold Rivaroxaban (Xarelto) x 3 DAYS PRIOR   Type of Anesthesia:  Not Indicated (PROPOFOL?)   Additional requests/questions:    Jiles Prows   03/21/2021, 3:21 PM

## 2021-03-21 NOTE — Telephone Encounter (Signed)
I tried to reach out to [facility] in order to obtain complete information needed for pre op clearance. Pre op provider needs to know procedure to be done, also what type of anesthesia being used if any. I was on hold with no answer for 6 minutes. I will fax these notes to fax # listed here asking for a reply or a new clearance to be faxed to our office with complete information.  Need procedure, type of anesthesia if any being used     Our office cannot complete a clearance without complete information.

## 2021-03-21 NOTE — Telephone Encounter (Signed)
Please contact requesting office for details surrounding procedure/surgery being done.  Thank you,  Jossie Ng. Savaya Hakes NP-C    03/21/2021, 9:02 AM [place] [address] Office [phone] Fax [phone]

## 2021-03-22 NOTE — Telephone Encounter (Signed)
Patient with diagnosis of atrial fibrillation on Xarelto for anticoagulation.    Procedure: colonoscopy Date of procedure: 05/20/21   CHA2DS2-VASc Score = 2   This indicates a 2.2% annual risk of stroke. The patient's score is based upon: CHF History: 0 HTN History: 0 Diabetes History: 0 Stroke History: 0 Vascular Disease History: 0 Age Score: 1 Gender Score: 1      CrCl 54 Platelet count 253  Per office protocol, patient can hold Xarelto for 3 days prior to procedure.   Patient will not need bridging with Lovenox (enoxaparin) around procedure.

## 2021-03-22 NOTE — Telephone Encounter (Signed)
° °  Primary Cardiologist: Sanda Klein, MD  Chart reviewed as part of pre-operative protocol coverage. Given past medical history and time since last visit, based on ACC/AHA guidelines, SHAQUEL JOSEPHSON would be at acceptable risk for the planned procedure without further cardiovascular testing.   Patient with diagnosis of atrial fibrillation on Xarelto for anticoagulation.     Procedure: colonoscopy Date of procedure: 05/20/21     CHA2DS2-VASc Score = 2   This indicates a 2.2% annual risk of stroke. The patient's score is based upon: CHF History: 0 HTN History: 0 Diabetes History: 0 Stroke History: 0 Vascular Disease History: 0 Age Score: 1 Gender Score: 1      CrCl 54 Platelet count 253   Per office protocol, patient can hold Xarelto for 3 days prior to procedure.   Patient will not need bridging with Lovenox (enoxaparin) around procedure. Please resume Xarelto as soon as hemostasis achieved, at the discretion of the surgeon.   I will route this recommendation to the requesting party via Epic fax function and remove from pre-op pool.  Please call with questions.  Lenna Sciara, NP 03/22/2021, 1:51 PM

## 2021-04-02 ENCOUNTER — Other Ambulatory Visit: Payer: Self-pay | Admitting: Cardiovascular Disease

## 2021-04-11 DIAGNOSIS — H524 Presbyopia: Secondary | ICD-10-CM | POA: Diagnosis not present

## 2021-04-11 DIAGNOSIS — H2513 Age-related nuclear cataract, bilateral: Secondary | ICD-10-CM | POA: Diagnosis not present

## 2021-04-11 DIAGNOSIS — H5213 Myopia, bilateral: Secondary | ICD-10-CM | POA: Diagnosis not present

## 2021-04-26 ENCOUNTER — Ambulatory Visit (INDEPENDENT_AMBULATORY_CARE_PROVIDER_SITE_OTHER): Payer: PPO

## 2021-04-26 DIAGNOSIS — I442 Atrioventricular block, complete: Secondary | ICD-10-CM | POA: Diagnosis not present

## 2021-04-26 LAB — CUP PACEART REMOTE DEVICE CHECK
Battery Remaining Longevity: 106 mo
Battery Voltage: 3.02 V
Brady Statistic AP VP Percent: 80.56 %
Brady Statistic AP VS Percent: 0.05 %
Brady Statistic AS VP Percent: 18.48 %
Brady Statistic AS VS Percent: 0.91 %
Brady Statistic RA Percent Paced: 80.94 %
Brady Statistic RV Percent Paced: 99.04 %
Date Time Interrogation Session: 20230124050922
Implantable Lead Implant Date: 20100505
Implantable Lead Implant Date: 20100505
Implantable Lead Location: 753859
Implantable Lead Location: 753860
Implantable Lead Model: 5076
Implantable Lead Model: 5076
Implantable Pulse Generator Implant Date: 20220124
Lead Channel Impedance Value: 342 Ohm
Lead Channel Impedance Value: 380 Ohm
Lead Channel Impedance Value: 399 Ohm
Lead Channel Impedance Value: 418 Ohm
Lead Channel Pacing Threshold Amplitude: 0.5 V
Lead Channel Pacing Threshold Amplitude: 0.75 V
Lead Channel Pacing Threshold Pulse Width: 0.4 ms
Lead Channel Pacing Threshold Pulse Width: 0.4 ms
Lead Channel Sensing Intrinsic Amplitude: 2.875 mV
Lead Channel Sensing Intrinsic Amplitude: 2.875 mV
Lead Channel Setting Pacing Amplitude: 2 V
Lead Channel Setting Pacing Amplitude: 2.5 V
Lead Channel Setting Pacing Pulse Width: 0.4 ms
Lead Channel Setting Sensing Sensitivity: 1.2 mV

## 2021-04-28 ENCOUNTER — Encounter: Payer: Self-pay | Admitting: Cardiovascular Disease

## 2021-04-28 ENCOUNTER — Telehealth: Payer: Self-pay

## 2021-04-28 NOTE — Telephone Encounter (Signed)
Successful telephone encounter to patient to follow up on questions regarding AMS and recent episodes of HR elevation. Known PAF history. +OAC. Unfortunately there were not episodes that coincide with patient symptoms (time/date). Encouraged patient to continue to monitor symptoms of tachycardia and contact device clinic with any additional questions or concerns. Patient appreciative of follow up.

## 2021-05-06 NOTE — Progress Notes (Signed)
Remote pacemaker transmission.   

## 2021-05-20 DIAGNOSIS — D123 Benign neoplasm of transverse colon: Secondary | ICD-10-CM | POA: Diagnosis not present

## 2021-05-20 DIAGNOSIS — Z86010 Personal history of colonic polyps: Secondary | ICD-10-CM | POA: Diagnosis not present

## 2021-05-20 DIAGNOSIS — Z980 Intestinal bypass and anastomosis status: Secondary | ICD-10-CM | POA: Diagnosis not present

## 2021-05-24 DIAGNOSIS — D123 Benign neoplasm of transverse colon: Secondary | ICD-10-CM | POA: Diagnosis not present

## 2021-05-25 DIAGNOSIS — H25811 Combined forms of age-related cataract, right eye: Secondary | ICD-10-CM | POA: Diagnosis not present

## 2021-05-25 DIAGNOSIS — H2512 Age-related nuclear cataract, left eye: Secondary | ICD-10-CM | POA: Diagnosis not present

## 2021-05-25 DIAGNOSIS — H2511 Age-related nuclear cataract, right eye: Secondary | ICD-10-CM | POA: Diagnosis not present

## 2021-05-26 ENCOUNTER — Other Ambulatory Visit: Payer: Self-pay | Admitting: Cardiovascular Disease

## 2021-06-15 DIAGNOSIS — H2512 Age-related nuclear cataract, left eye: Secondary | ICD-10-CM | POA: Diagnosis not present

## 2021-06-15 DIAGNOSIS — H25012 Cortical age-related cataract, left eye: Secondary | ICD-10-CM | POA: Diagnosis not present

## 2021-06-15 DIAGNOSIS — H25812 Combined forms of age-related cataract, left eye: Secondary | ICD-10-CM | POA: Diagnosis not present

## 2021-06-16 DIAGNOSIS — Z961 Presence of intraocular lens: Secondary | ICD-10-CM | POA: Diagnosis not present

## 2021-07-06 DIAGNOSIS — D224 Melanocytic nevi of scalp and neck: Secondary | ICD-10-CM | POA: Diagnosis not present

## 2021-07-06 DIAGNOSIS — L812 Freckles: Secondary | ICD-10-CM | POA: Diagnosis not present

## 2021-07-06 DIAGNOSIS — L821 Other seborrheic keratosis: Secondary | ICD-10-CM | POA: Diagnosis not present

## 2021-07-06 DIAGNOSIS — L853 Xerosis cutis: Secondary | ICD-10-CM | POA: Diagnosis not present

## 2021-07-21 DIAGNOSIS — Z961 Presence of intraocular lens: Secondary | ICD-10-CM | POA: Diagnosis not present

## 2021-07-26 ENCOUNTER — Ambulatory Visit (INDEPENDENT_AMBULATORY_CARE_PROVIDER_SITE_OTHER): Payer: PPO

## 2021-07-26 DIAGNOSIS — I442 Atrioventricular block, complete: Secondary | ICD-10-CM | POA: Diagnosis not present

## 2021-07-27 LAB — CUP PACEART REMOTE DEVICE CHECK
Battery Remaining Longevity: 104 mo
Battery Voltage: 3.01 V
Brady Statistic AP VP Percent: 81.84 %
Brady Statistic AP VS Percent: 0.09 %
Brady Statistic AS VP Percent: 17.21 %
Brady Statistic AS VS Percent: 0.86 %
Brady Statistic RA Percent Paced: 82.17 %
Brady Statistic RV Percent Paced: 99.05 %
Date Time Interrogation Session: 20230425061942
Implantable Lead Implant Date: 20100505
Implantable Lead Implant Date: 20100505
Implantable Lead Location: 753859
Implantable Lead Location: 753860
Implantable Lead Model: 5076
Implantable Lead Model: 5076
Implantable Pulse Generator Implant Date: 20220124
Lead Channel Impedance Value: 361 Ohm
Lead Channel Impedance Value: 380 Ohm
Lead Channel Impedance Value: 418 Ohm
Lead Channel Impedance Value: 418 Ohm
Lead Channel Pacing Threshold Amplitude: 0.5 V
Lead Channel Pacing Threshold Amplitude: 0.625 V
Lead Channel Pacing Threshold Pulse Width: 0.4 ms
Lead Channel Pacing Threshold Pulse Width: 0.4 ms
Lead Channel Sensing Intrinsic Amplitude: 3.125 mV
Lead Channel Sensing Intrinsic Amplitude: 3.125 mV
Lead Channel Sensing Intrinsic Amplitude: 4.625 mV
Lead Channel Sensing Intrinsic Amplitude: 4.625 mV
Lead Channel Setting Pacing Amplitude: 2 V
Lead Channel Setting Pacing Amplitude: 2.5 V
Lead Channel Setting Pacing Pulse Width: 0.4 ms
Lead Channel Setting Sensing Sensitivity: 1.2 mV

## 2021-08-10 NOTE — Progress Notes (Signed)
Remote pacemaker transmission.   

## 2021-08-17 ENCOUNTER — Ambulatory Visit (INDEPENDENT_AMBULATORY_CARE_PROVIDER_SITE_OTHER): Payer: PPO | Admitting: Podiatry

## 2021-08-17 ENCOUNTER — Ambulatory Visit (INDEPENDENT_AMBULATORY_CARE_PROVIDER_SITE_OTHER): Payer: PPO

## 2021-08-17 DIAGNOSIS — M778 Other enthesopathies, not elsewhere classified: Secondary | ICD-10-CM

## 2021-08-17 NOTE — Progress Notes (Addendum)
HPI: 66 y.o. female presenting today for evaluation of pain and tenderness associated to the right foot.  Patient has had bunion surgery and sustained fractures to the second and third digits of the right foot about a year ago.  That healed uneventfully.  She has been experiencing some recent pain and tenderness to the ball of the foot and would like it evaluated.   Patient also complains of pain between the right second and third toes. She feels that the toes are rubbing against each other. They have been painful for several months despite wider shoes. She presents for further treatment and evaluation  Past Medical History:  Diagnosis Date   Allergic rhinitis    Asthmatic bronchitis    Complete heart block (HCC)    GERD (gastroesophageal reflux disease)    MRSA (methicillin resistant Staphylococcus aureus) 2013   left buttock   Nonsustained ventricular tachycardia    Presence of permanent cardiac pacemaker 08-05-08   Medtronic Adapta   Vaginitis    Vocal cord paresis     Past Surgical History:  Procedure Laterality Date   ACROMIO-CLAVICULAR JOINT REPAIR Left 12/10/2014   Procedure: LEFT CORACOCLAVICULAR RECONSTRUCTION WITH  ZIPLOOP;  Surgeon: Tania Ade, MD;  Location: [facility];  Service: Orthopedics;  Laterality: Left;   APPENDECTOMY  05-2010   COLON SURGERY N/A    Phreesia 01/06/2020   DILATION AND CURETTAGE OF UTERUS  1978   FRACTURE SURGERY N/A    Phreesia 01/06/2020   HAND SURGERY     PACEMAKER INSERTION  08/05/08   Medtronic Adapta   PPM GENERATOR CHANGEOUT N/A 04/26/2020   Procedure: PPM GENERATOR CHANGEOUT;  Surgeon: Sanda Klein, MD;  Location: [facility];  Service: Cardiovascular;  Laterality: N/A;   Vocal cord splint  2005    Allergies  Allergen Reactions   Erythromycin    Sulfur     Other reaction(s): Unknown   Sulfa Antibiotics Rash    Urticarial rash developed 1 day after completing a 14 day course of TMP/SMX     Physical  Exam: General: The patient is alert and oriented x3 in no acute distress.  Dermatology: Skin is warm, dry and supple bilateral lower extremities. Negative for open lesions or macerations. Symptomatic callus noted to the medial aspect of the third toe.   Vascular: Palpable pedal pulses bilaterally. Capillary refill within normal limits.  Negative for any significant edema or erythema  Neurological: Light touch and protective threshold grossly intact  Musculoskeletal Exam: No pedal deformities noted.  Negative for any significant tenderness to palpation or range of motion of the lesser MTP joints or first MTP joint of the foot  Radiographic Exam:  Normal osseous mineralization.  There is some degenerative changes noted to the first MTP joint of the foot.  There is also some articular irregularity to the base of the second proximal phalanx secondary to the fractures.  Clinically there is no pain on palpation or range of motion of these areas.  Orthopedic hardware intact Medial exostosis noted to the DIPJ of the third digit right foot pressing against the adjacent toe. Clinically this correlates with the symptomatic callus developing to the medial third toe.   Assessment: 1.  Mild capsulitis right foot 2. 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   Plan of Care:  1. Patient evaluated. X-Rays reviewed.  2.  Continue conservative treatment for now.  Recommend good supportive shoes and sneakers that do not constrict the toebox  area 3.  Patient may continue full activity no restrictions 4.  Today we discussed the conservative versus surgical management of the symptomatic exostosis of the third toe. I do feel that simple exostectomy/possible arthroplasty would alleviate the patients symptoms. The patient opts for surgical management. All possible complications and details of the procedure were explained. All patient questions were answered. No guarantees were expressed or  implied. 3. Authorization for surgery was initiated today. Surgery will consist of exostectomy versus possible arthroplasty third digit right foot. 4. RTC 1 week postop      Edrick Kins, DPM [facility]  Dr. Edrick Kins, DPM    [address], [place] [address]                Office [phone]  Fax [phone]

## 2021-08-23 ENCOUNTER — Telehealth: Payer: Self-pay | Admitting: Urology

## 2021-08-23 NOTE — Telephone Encounter (Signed)
DOS - 09/08/21  [facility]  REPAIR 3RD RIGHT --- [id number]  [facility] EFFECTIVE DATE - 04/03/21   RECEIVED FAX FROM [facility] STATING THAT CPT CODE 03013 HAS BEEN APPROVED, AUTH # [id number], GOOD FROM 09/08/21 - 12/07/21.

## 2021-09-08 ENCOUNTER — Other Ambulatory Visit: Payer: Self-pay | Admitting: Podiatry

## 2021-09-08 DIAGNOSIS — M25774 Osteophyte, right foot: Secondary | ICD-10-CM | POA: Diagnosis not present

## 2021-09-08 DIAGNOSIS — M778 Other enthesopathies, not elsewhere classified: Secondary | ICD-10-CM | POA: Diagnosis not present

## 2021-09-08 DIAGNOSIS — I4891 Unspecified atrial fibrillation: Secondary | ICD-10-CM | POA: Diagnosis not present

## 2021-09-08 DIAGNOSIS — M899 Disorder of bone, unspecified: Secondary | ICD-10-CM | POA: Diagnosis not present

## 2021-09-08 MED ORDER — OXYCODONE-ACETAMINOPHEN 5-325 MG PO TABS: 1.0000 | ORAL_TABLET | ORAL | 0 refills | Status: DC | PRN

## 2021-09-08 NOTE — Progress Notes (Signed)
PRN postop 

## 2021-09-14 ENCOUNTER — Ambulatory Visit (INDEPENDENT_AMBULATORY_CARE_PROVIDER_SITE_OTHER): Payer: BC Managed Care – PPO | Admitting: Podiatry

## 2021-09-14 ENCOUNTER — Ambulatory Visit (INDEPENDENT_AMBULATORY_CARE_PROVIDER_SITE_OTHER): Payer: PPO

## 2021-09-14 DIAGNOSIS — M778 Other enthesopathies, not elsewhere classified: Secondary | ICD-10-CM

## 2021-09-14 DIAGNOSIS — Z9889 Other specified postprocedural states: Secondary | ICD-10-CM | POA: Diagnosis not present

## 2021-09-14 NOTE — Progress Notes (Signed)
   Subjective:  Patient presents today status post exostectomy right third toe. DOS: 09/08/2021.  Patient states that she is doing well.  She has kept the dressings clean dry and intact.  She has also been wearing the postsurgical shoe.  No new complaints at this time  Past Medical History:  Diagnosis Date   Allergic rhinitis    Asthmatic bronchitis    Complete heart block (HCC)    GERD (gastroesophageal reflux disease)    MRSA (methicillin resistant Staphylococcus aureus) 2013   left buttock   Nonsustained ventricular tachycardia    Presence of permanent cardiac pacemaker 08-05-08   Medtronic Adapta   Vaginitis    Vocal cord paresis       Objective/Physical Exam Neurovascular status intact.  Skin incisions appear to be well coapted with sutures intact. No sign of infectious process noted. No dehiscence. No active bleeding noted.  Negative for any ecchymosis or edema.  Radiographic Exam:  Medial exostectomy noted to the middle phalanx of the third digit right foot.  The osteotomy slightly extends into the head of the proximal phalanx   Assessment: 1. s/p exostectomy right third digit. DOS: 09/08/2021   Plan of Care:  1. Patient was evaluated. X-rays reviewed 2.  Dressings changed.  Patient may begin washing and showering and getting the foot wet 3.  Recommend antibiotic ointment and a Band-Aid 4.  Patient may transition out of the postsurgical shoe into good supportive sneakers that do not constrict the toebox area 5.  Return to clinic 1 week for suture removal   Edrick Kins, DPM [facility]  Dr. Edrick Kins, DPM    [address], [place] [address]                Office [phone]  Fax [phone]

## 2021-09-21 ENCOUNTER — Ambulatory Visit (INDEPENDENT_AMBULATORY_CARE_PROVIDER_SITE_OTHER): Payer: BC Managed Care – PPO | Admitting: Podiatry

## 2021-09-21 DIAGNOSIS — Z9889 Other specified postprocedural states: Secondary | ICD-10-CM

## 2021-09-21 NOTE — Progress Notes (Signed)
   Subjective:  Patient presents today status post exostectomy right third toe. DOS: 09/08/2021.  Patient is doing very well.  She has no pain or tenderness.  She has been applying a Band-Aid.  No new complaints at this time  Past Medical History:  Diagnosis Date   Allergic rhinitis    Asthmatic bronchitis    Complete heart block (HCC)    GERD (gastroesophageal reflux disease)    MRSA (methicillin resistant Staphylococcus aureus) 2013   left buttock   Nonsustained ventricular tachycardia    Presence of permanent cardiac pacemaker 08-05-08   Medtronic Adapta   Vaginitis    Vocal cord paresis       Objective/Physical Exam Neurovascular status intact.  Skin incisions appear to be well coapted with sutures intact. No sign of infectious process noted. No dehiscence. No active bleeding noted.  No pain or tenderness to the toe  Assessment: 1. s/p exostectomy right third digit. DOS: 09/08/2021   Plan of Care:  1. Patient was evaluated.  2.  Sutures removed 3.  Patient may resume good supportive shoes and sneakers 4.  Full activity no restrictions 5.  Recommend good supportive shoes with OTC arch supports.  We did discuss custom versus OTC orthotics.  For now we are going to try OTC arch supports due to cost.  Recommend power step insoles from [employer]  6.  Return to clinic as needed   Edrick Kins, DPM [facility]  Dr. Edrick Kins, DPM    [address], [place] [address]                Office [phone]  Fax [phone]

## 2021-10-01 ENCOUNTER — Other Ambulatory Visit: Payer: Self-pay | Admitting: Cardiovascular Disease

## 2021-10-01 DIAGNOSIS — I480 Paroxysmal atrial fibrillation: Secondary | ICD-10-CM

## 2021-10-03 NOTE — Telephone Encounter (Signed)
Xarelto '20mg'$  refill request received. Pt is 66 years old, weight-66.9kg, Crea-0.93 on 01/05/2021 via [facility] from [place], last seen by Dr. Sallyanne Kuster on 02/07/2021, Diagnosis-Afib, [place].71m/min; Dose is appropriate based on dosing criteria. Will send in refill to requested pharmacy.

## 2021-10-10 ENCOUNTER — Encounter: Payer: BC Managed Care – PPO | Admitting: Podiatry

## 2021-10-25 ENCOUNTER — Ambulatory Visit (INDEPENDENT_AMBULATORY_CARE_PROVIDER_SITE_OTHER): Payer: PPO

## 2021-10-25 DIAGNOSIS — I442 Atrioventricular block, complete: Secondary | ICD-10-CM | POA: Diagnosis not present

## 2021-10-26 LAB — CUP PACEART REMOTE DEVICE CHECK
Battery Remaining Longevity: 104 mo
Battery Voltage: 3 V
Brady Statistic AP VP Percent: 84.61 %
Brady Statistic AP VS Percent: 0.04 %
Brady Statistic AS VP Percent: 15.06 %
Brady Statistic AS VS Percent: 0.27 %
Brady Statistic RA Percent Paced: 59.83 %
Brady Statistic RV Percent Paced: 99.6 %
Date Time Interrogation Session: 20230726112144
Implantable Lead Implant Date: 20100505
Implantable Lead Implant Date: 20100505
Implantable Lead Location: 753859
Implantable Lead Location: 753860
Implantable Lead Model: 5076
Implantable Lead Model: 5076
Implantable Pulse Generator Implant Date: 20220124
Lead Channel Impedance Value: 342 Ohm
Lead Channel Impedance Value: 399 Ohm
Lead Channel Impedance Value: 418 Ohm
Lead Channel Impedance Value: 475 Ohm
Lead Channel Pacing Threshold Amplitude: 0.75 V
Lead Channel Pacing Threshold Amplitude: 0.75 V
Lead Channel Pacing Threshold Pulse Width: 0.4 ms
Lead Channel Pacing Threshold Pulse Width: 0.4 ms
Lead Channel Sensing Intrinsic Amplitude: 0.875 mV
Lead Channel Sensing Intrinsic Amplitude: 0.875 mV
Lead Channel Sensing Intrinsic Amplitude: 4.125 mV
Lead Channel Sensing Intrinsic Amplitude: 4.125 mV
Lead Channel Setting Pacing Amplitude: 2 V
Lead Channel Setting Pacing Amplitude: 2.5 V
Lead Channel Setting Pacing Pulse Width: 0.4 ms
Lead Channel Setting Sensing Sensitivity: 1.2 mV

## 2021-11-10 DIAGNOSIS — Z6824 Body mass index (BMI) 24.0-24.9, adult: Secondary | ICD-10-CM | POA: Diagnosis not present

## 2021-11-10 DIAGNOSIS — Z1231 Encounter for screening mammogram for malignant neoplasm of breast: Secondary | ICD-10-CM | POA: Diagnosis not present

## 2021-11-10 DIAGNOSIS — M8588 Other specified disorders of bone density and structure, other site: Secondary | ICD-10-CM | POA: Diagnosis not present

## 2021-11-10 DIAGNOSIS — Z01419 Encounter for gynecological examination (general) (routine) without abnormal findings: Secondary | ICD-10-CM | POA: Diagnosis not present

## 2021-11-10 DIAGNOSIS — F32A Depression, unspecified: Secondary | ICD-10-CM | POA: Diagnosis not present

## 2021-11-10 DIAGNOSIS — Z8262 Family history of osteoporosis: Secondary | ICD-10-CM | POA: Diagnosis not present

## 2021-11-10 DIAGNOSIS — R29890 Loss of height: Secondary | ICD-10-CM | POA: Diagnosis not present

## 2021-11-10 DIAGNOSIS — N958 Other specified menopausal and perimenopausal disorders: Secondary | ICD-10-CM | POA: Diagnosis not present

## 2021-11-10 DIAGNOSIS — N952 Postmenopausal atrophic vaginitis: Secondary | ICD-10-CM | POA: Diagnosis not present

## 2021-11-19 ENCOUNTER — Other Ambulatory Visit: Payer: Self-pay | Admitting: Cardiovascular Disease

## 2021-11-22 NOTE — Progress Notes (Signed)
Remote pacemaker transmission.   

## 2022-01-10 DIAGNOSIS — F418 Other specified anxiety disorders: Secondary | ICD-10-CM | POA: Diagnosis not present

## 2022-01-10 DIAGNOSIS — Z7901 Long term (current) use of anticoagulants: Secondary | ICD-10-CM | POA: Diagnosis not present

## 2022-01-10 DIAGNOSIS — Z23 Encounter for immunization: Secondary | ICD-10-CM | POA: Diagnosis not present

## 2022-01-10 DIAGNOSIS — Z950 Presence of cardiac pacemaker: Secondary | ICD-10-CM | POA: Diagnosis not present

## 2022-01-10 DIAGNOSIS — Z0000 Encounter for general adult medical examination without abnormal findings: Secondary | ICD-10-CM | POA: Diagnosis not present

## 2022-01-10 DIAGNOSIS — I480 Paroxysmal atrial fibrillation: Secondary | ICD-10-CM | POA: Diagnosis not present

## 2022-01-23 DIAGNOSIS — Z961 Presence of intraocular lens: Secondary | ICD-10-CM | POA: Diagnosis not present

## 2022-01-26 ENCOUNTER — Ambulatory Visit (INDEPENDENT_AMBULATORY_CARE_PROVIDER_SITE_OTHER): Payer: PPO

## 2022-01-26 DIAGNOSIS — I442 Atrioventricular block, complete: Secondary | ICD-10-CM | POA: Diagnosis not present

## 2022-01-27 LAB — CUP PACEART REMOTE DEVICE CHECK
Battery Remaining Longevity: 102 mo
Battery Voltage: 3 V
Brady Statistic AP VP Percent: 83.75 %
Brady Statistic AP VS Percent: 0.03 %
Brady Statistic AS VP Percent: 16.02 %
Brady Statistic AS VS Percent: 0.2 %
Brady Statistic RA Percent Paced: 83.14 %
Brady Statistic RV Percent Paced: 99.77 %
Date Time Interrogation Session: 20231026071119
Implantable Lead Connection Status: 753985
Implantable Lead Connection Status: 753985
Implantable Lead Implant Date: 20100505
Implantable Lead Implant Date: 20100505
Implantable Lead Location: 753859
Implantable Lead Location: 753860
Implantable Lead Model: 5076
Implantable Lead Model: 5076
Implantable Pulse Generator Implant Date: 20220124
Lead Channel Impedance Value: 361 Ohm
Lead Channel Impedance Value: 418 Ohm
Lead Channel Impedance Value: 437 Ohm
Lead Channel Impedance Value: 494 Ohm
Lead Channel Pacing Threshold Amplitude: 0.5 V
Lead Channel Pacing Threshold Amplitude: 0.75 V
Lead Channel Pacing Threshold Pulse Width: 0.4 ms
Lead Channel Pacing Threshold Pulse Width: 0.4 ms
Lead Channel Sensing Intrinsic Amplitude: 2.125 mV
Lead Channel Sensing Intrinsic Amplitude: 2.125 mV
Lead Channel Sensing Intrinsic Amplitude: 4.125 mV
Lead Channel Sensing Intrinsic Amplitude: 4.125 mV
Lead Channel Setting Pacing Amplitude: 2 V
Lead Channel Setting Pacing Amplitude: 2.5 V
Lead Channel Setting Pacing Pulse Width: 0.4 ms
Lead Channel Setting Sensing Sensitivity: 1.2 mV
Zone Setting Status: 755011

## 2022-02-02 NOTE — Progress Notes (Signed)
Remote pacemaker transmission.   

## 2022-03-20 ENCOUNTER — Ambulatory Visit: Payer: PPO | Attending: Cardiovascular Disease | Admitting: Cardiovascular Disease

## 2022-03-20 ENCOUNTER — Encounter: Payer: Self-pay | Admitting: Cardiovascular Disease

## 2022-03-20 VITALS — BP 132/80 | HR 76 | Ht 67.0 in | Wt 150.0 lb

## 2022-03-20 DIAGNOSIS — E7800 Pure hypercholesterolemia, unspecified: Secondary | ICD-10-CM | POA: Diagnosis not present

## 2022-03-20 DIAGNOSIS — Z950 Presence of cardiac pacemaker: Secondary | ICD-10-CM

## 2022-03-20 DIAGNOSIS — I480 Paroxysmal atrial fibrillation: Secondary | ICD-10-CM | POA: Diagnosis not present

## 2022-03-20 DIAGNOSIS — I442 Atrioventricular block, complete: Secondary | ICD-10-CM | POA: Diagnosis not present

## 2022-03-20 DIAGNOSIS — I495 Sick sinus syndrome: Secondary | ICD-10-CM

## 2022-03-20 DIAGNOSIS — D6869 Other thrombophilia: Secondary | ICD-10-CM

## 2022-03-20 DIAGNOSIS — I4729 Other ventricular tachycardia: Secondary | ICD-10-CM

## 2022-03-20 NOTE — Progress Notes (Signed)
Patient ID: Brandy [name], female   DOB: 15-Sep-1955, 66 y.o.   MRN: [id number]      Cardiology Office Note    Date:  03/20/2022   ID:  Brandy [name], DOB 18-Apr-1955, MRN [id number]  PCP:  Harlan Stains, MD  Cardiologist:   Sanda Klein, MD   Chief Complaint  Patient presents with   Pacemaker Check    History of Present Illness:  Brandy [name] is a 66 y.o. female with complete heart block (initial device 2010, pacemaker generator change out in January 2022 - Medtronic Azure, atrial and ventricular leads are both Medtronic 5076).  She has subsequently developed episodes of asymptomatic ventricular tachycardia (nonsustained) and paroxysmal atrial fibrillation, detected by her device.  She has also developed increased need for atrial pacing consistent with sick sinus syndrome so rate response was turned on.  She had pacemaker syndrome when her old device reached ERI (dizziness and fatigue and even intrascapular pain), all of which resolved when her pacemaker generator was changed out.  She feels that she has plenty of energy since her rate response sensor was turned on.  Remains quite active roughly 5 hours a day reporting.  Her pacemaker.  There is still little blunting of the heart rate histogram, but no changes were made today since she is not symptomatic.  She has virtually 100% ventricular pacing and also has 78% atrial pacing.  The burden of atrial fibrillation is roughly 6%, but this is almost entirely made up by a 3-week event that occurred over the summer.  Since then she is only had 1 other episode of true atrial fibrillation lasting for about 12 hours, in September.  In the last 3 months she has had 1 episode of nonsustained ventricular tachycardia lasting for only 8 beats.  All of these arrhythmic events are asymptomatic.  Lead parameters are excellent and generator longevity is estimated at about 8 years.  The patient specifically denies any chest pain at rest exertion,  dyspnea at rest or with exertion, orthopnea, paroxysmal nocturnal dyspnea, syncope, palpitations, focal neurological deficits, intermittent claudication, lower extremity edema, unexplained weight gain, cough, hemoptysis or wheezing.  Denies recent falls, injuries or serious bleeding.  She is excited about a planned trip to [place] in [place] next spring.   She had an unusual episode of loss of consciousness in May 2021 with facial injuries, tongue biting and a prolonged period of disorientation. The next day she had documented orthostatic hypotension with her systolic blood pressure dropping from 150 down to 70. She was noted to have stenosis of the vertebral arteries on CT angiography and atorvastatin was also started.  She was already on anticoagulation for asymptomatic paroxysmal atrial fibrillation detected by her pacemaker, at that time.  She subsequently underwent an MRI that did not show any major brain abnormalities.  LV EF is 50-55%, slightly low likely due to RV pacing.  No valvular abnormalities were seen.  She has a history of normal coronary arteries and a calcium score of 0 on a coronary CT angiogram performed in July 2019.   Past Medical History:  Diagnosis Date   Allergic rhinitis    Asthmatic bronchitis    Complete heart block (HCC)    GERD (gastroesophageal reflux disease)    MRSA (methicillin resistant Staphylococcus aureus) 2013   left buttock   Nonsustained ventricular tachycardia (HCC)    Presence of permanent cardiac pacemaker 08-05-08   Medtronic Adapta   Vaginitis    Vocal cord paresis  Past Surgical History:  Procedure Laterality Date   ACROMIO-CLAVICULAR JOINT REPAIR Left 12/10/2014   Procedure: LEFT CORACOCLAVICULAR RECONSTRUCTION WITH  ZIPLOOP;  Surgeon: Tania Ade, MD;  Location: [facility];  Service: Orthopedics;  Laterality: Left;   APPENDECTOMY  05-2010   COLON SURGERY N/A    Phreesia 01/06/2020   DILATION AND CURETTAGE OF UTERUS   1978   FRACTURE SURGERY N/A    Phreesia 01/06/2020   HAND SURGERY     PACEMAKER INSERTION  08/05/08   Medtronic Adapta   PPM GENERATOR CHANGEOUT N/A 04/26/2020   Procedure: PPM GENERATOR CHANGEOUT;  Surgeon: Sanda Klein, MD;  Location: [facility];  Service: Cardiovascular;  Laterality: N/A;   Vocal cord splint  2005    Outpatient Medications Prior to Visit  Medication Sig Dispense Refill   atorvastatin (LIPITOR) 20 MG tablet TAKE 1 TABLET BY MOUTH DAILY 90 tablet 2   Calcium Carbonate-Vitamin D 500-125 MG-UNIT TABS Take 1 tablet by mouth daily.     fluticasone (FLONASE) 50 MCG/ACT nasal spray USE 1 SPRAY IN EACH NOSTRIL DAILY (Patient taking differently: Place 1 spray into both nostrils daily. USE 1 SPRAY IN EACH NOSTRIL DAILY) 16 g 5   metoprolol succinate (TOPROL-XL) 50 MG 24 hr tablet TAKE 1 TABLET BY MOUTH DAILY WITH OR IMMEDIATELY FOLLOWING A MEAL 90 tablet 3   sertraline (ZOLOFT) 50 MG tablet Take 50 mg by mouth daily.     XARELTO 20 MG TABS tablet TAKE 1 TABLET BY MOUTH DAILY WITH SUPPER 90 tablet 1   ALPRAZolam (XANAX) 0.25 MG tablet Take 0.25 mg by mouth at bedtime as needed for sleep.  (Patient not taking: Reported on 03/20/2022)     FLUoxetine (PROZAC) 20 MG capsule Take 20 mg by mouth daily.      oxyCODONE-acetaminophen (PERCOCET) 5-325 MG tablet Take 1 tablet by mouth every 4 (four) hours as needed for severe pain. 20 tablet 0   triamcinolone cream (KENALOG) 0.1 % as needed. (Patient not taking: Reported on 03/20/2022)     valACYclovir (VALTREX) 1000 MG tablet Take 1 tablet by mouth daily as needed (cold sores).  (Patient not taking: Reported on 03/20/2022)     No facility-administered medications prior to visit.     Allergies:   Erythromycin, Sulfur, and Sulfa antibiotics   Social History   Socioeconomic History   Marital status: Widowed    Spouse name: Not on file   Number of children: Not on file   Years of education: Not on file   Highest education  level: Not on file  Occupational History   Not on file  Tobacco Use   Smoking status: Never   Smokeless tobacco: Never  Vaping Use   Vaping Use: Never used  Substance and Sexual Activity   Alcohol use: Not Currently   Drug use: Never   Sexual activity: Not Currently    Birth control/protection: None  Other Topics Concern   Not on file  Social History Narrative   Not on file   Social Determinants of Health   Financial Resource Strain: Not on file  Food Insecurity: Not on file  Transportation Needs: Not on file  Physical Activity: Not on file  Stress: Not on file  Social Connections: Not on file     Family History:  The patient's family history includes Alcohol abuse in her paternal grandfather and sister; Cancer in her maternal grandmother; Coronary artery disease in her mother; Depression in her father, mother, and sister; Diabetes  in her father, maternal grandfather, and paternal uncle; Heart disease in her father, maternal grandfather, mother, and paternal grandfather; Hypertension in her father, mother, and sister.   ROS:   Please see the history of present illness.    ROS All other systems are reviewed and are negative.   PHYSICAL EXAM:   VS:  BP 132/80 (BP Location: Left Arm, Patient Position: Sitting, Cuff Size: Normal)   Pulse 76   Ht '5\' 7"'$  (1.702 m)   Wt 150 lb (68 kg)   SpO2 98%   BMI 23.49 kg/m       General: Alert, oriented x3, no distress, appears lean and fit.  The pacemaker site is healthy. Head: no evidence of trauma, PERRL, EOMI, no exophtalmos or lid lag, no myxedema, no xanthelasma; normal ears, nose and oropharynx Neck: normal jugular venous pulsations and no hepatojugular reflux; brisk carotid pulses without delay and no carotid bruits Chest: clear to auscultation, no signs of consolidation by percussion or palpation, normal fremitus, symmetrical and full respiratory excursions Cardiovascular: normal position and quality of the apical impulse,  regular rhythm, normal first and second heart sounds, no murmurs, rubs or gallops Abdomen: no tenderness or distention, no masses by palpation, no abnormal pulsatility or arterial bruits, normal bowel sounds, no hepatosplenomegaly Extremities: no clubbing, cyanosis or edema; 2+ radial, ulnar and brachial pulses bilaterally; 2+ right femoral, posterior tibial and dorsalis pedis pulses; 2+ left femoral, posterior tibial and dorsalis pedis pulses; no subclavian or femoral bruits Neurological: grossly nonfocal Psych: Normal mood and affect    Wt Readings from Last 3 Encounters:  03/20/22 150 lb (68 kg)  02/07/21 147 lb 6.4 oz (66.9 kg)  07/26/20 145 lb (65.8 kg)      Studies/Labs Reviewed:  CT angiogram May 2021  IMPRESSION: 1. Atherosclerosis is mild for age in the neck, with no extracranial carotid or vertebral artery stenosis. Mild generalized arterial tortuosity. 2. Bilateral proximal subclavian artery plaque without significant stenosis. 3. Cervical spine and TMJ degeneration.  MRI of brain 10/28/2019  IMPRESSION: No acute or subacute finding. Chronic small-vessel ischemic changes affecting the brain, advanced in the pons and moderate in the cerebral hemispheric white matter.     EKG: Ordered today, personally reviewed . This shows AV sequentially paced rhythm with a single PVC.  The paced QRS is relatively narrowed 142 ms.  Recent Labs: No results found for requested labs within last 365 days.   Lipid Panel    Component Value Date/Time   CHOL 223 (H) 08/21/2019 2100   CHOL 215 (H) 03/28/2018 0858   TRIG 55 08/21/2019 2100   HDL 71 08/21/2019 2100   HDL 61 03/28/2018 0858   CHOLHDL 3.1 08/21/2019 2100   VLDL 11 08/21/2019 2100   LDLCALC 141 (H) 08/21/2019 2100   LDLCALC 134 (H) 03/28/2018 0858    01/10/2022 Cholesterol 159, HDL 69, LDL 74, triglycerides 87 Hemoglobin 13.5, creatinine 1.0, potassium 5.1, ALT 18   ASSESSMENT:    1. CHB (complete heart block)  (HCC)   2. SSS (sick sinus syndrome) ([place])   3. NSVT (nonsustained ventricular tachycardia) ([place])   4. Pacemaker   5. Paroxysmal atrial fibrillation (HCC)   6. Acquired thrombophilia ([place])   7. Hypercholesterolemia       PLAN:  In order of problems listed above:  CHB: She is pacemaker dependent and developed pacemaker syndrome and her device switch to VVI at RRT.  Early onset at age 48 or so.  Both her father and her  sister received pacemakers in their 16s. SSS: Current rate response sensor settings are providing satisfactory symptom relief.  No changes made today. NSVT: Continues to have occasional PVCs and rare episodes of brief nonsustained VT, consistently asymptomatic. PPM: Her pacemaker system is MRI conditional.  Normal device function.  Continue remote downloads every 3 months. AFib: CHA2DS2-VASc score 2 (age and gender).  He had a roughly 3-4-week episode of persistent atrial fibrillation over the summer, since then only 12-hour episode. Anticoagulation: On Xarelto.  No bleeding issues or falls. HLP: On statin, ideally LDL target under 70 - very close to that on labs in October.  Appeared to have some degree of vertebral artery stenosis on CT angiography of the neck, but calcium score was 0 and she did not have any CAD on coronary CT angiography.  Labs followed by PCP    Medication Adjustments/Labs and Tests Ordered: Current medicines are reviewed at length with the patient today.  Concerns regarding medicines are outlined above.  Medication changes, Labs and Tests ordered today are listed in the Patient Instructions below. Patient Instructions  Medication Instructions:  Your physician recommends that you continue on your current medications as directed. Please refer to the Current Medication list given to you today.  *If you need a refill on your cardiac medications before your next appointment, please call your pharmacy*   Follow-Up: At [facility], you and your  health needs are our priority.  As part of our continuing mission to provide you with exceptional heart care, we have created designated Provider Care Teams.  These Care Teams include your primary Cardiologist (physician) and Advanced Practice Providers (APPs -  Physician Assistants and Nurse Practitioners) who all work together to provide you with the care you need, when you need it.  We recommend signing up for the patient portal called "MyChart".  Sign up information is provided on this After Visit Summary.  MyChart is used to connect with patients for Virtual Visits (Telemedicine).  Patients are able to view lab/test results, encounter notes, upcoming appointments, etc.  Non-urgent messages can be sent to your provider as well.   To learn more about what you can do with MyChart, go to [email or web address].    Your next appointment:   12 month(s)  The format for your next appointment:   In Person  Provider:   Sanda Klein, MD         Signed, Sanda Klein, MD  03/20/2022 9:50 AM    [facility] [place], [place],   [address] Phone: [phone]; Fax: [phone]

## 2022-03-20 NOTE — Patient Instructions (Addendum)
Medication Instructions:  Your physician recommends that you continue on your current medications as directed. Please refer to the Current Medication list given to you today.  *If you need a refill on your cardiac medications before your next appointment, please call your pharmacy*   Follow-Up: At [facility], you and your health needs are our priority.  As part of our continuing mission to provide you with exceptional heart care, we have created designated Provider Care Teams.  These Care Teams include your primary Cardiologist (physician) and Advanced Practice Providers (APPs -  Physician Assistants and Nurse Practitioners) who all work together to provide you with the care you need, when you need it.  We recommend signing up for the patient portal called "MyChart".  Sign up information is provided on this After Visit Summary.  MyChart is used to connect with patients for Virtual Visits (Telemedicine).  Patients are able to view lab/test results, encounter notes, upcoming appointments, etc.  Non-urgent messages can be sent to your provider as well.   To learn more about what you can do with MyChart, go to [email or web address].    Your next appointment:   12 month(s)  The format for your next appointment:   In Person  Provider:   Mihai Croitoru, MD 

## 2022-03-30 ENCOUNTER — Other Ambulatory Visit: Payer: Self-pay | Admitting: Cardiovascular Disease

## 2022-04-17 ENCOUNTER — Other Ambulatory Visit: Payer: Self-pay | Admitting: Cardiovascular Disease

## 2022-04-17 DIAGNOSIS — I480 Paroxysmal atrial fibrillation: Secondary | ICD-10-CM

## 2022-04-17 NOTE — Telephone Encounter (Signed)
Prescription refill request for Xarelto received.  Indication: Afib  Last office visit: 03/20/22 (Croitoru)  Weight: 68kg Age: 67 Scr: 1.00 (01/10/22 via [facility]) CrCl: 57.8ML/MIN  Appropriate dose and refill sent to requested pharmacy.

## 2022-04-27 ENCOUNTER — Telehealth: Payer: Self-pay

## 2022-04-27 ENCOUNTER — Ambulatory Visit (INDEPENDENT_AMBULATORY_CARE_PROVIDER_SITE_OTHER): Payer: PPO

## 2022-04-27 DIAGNOSIS — I495 Sick sinus syndrome: Secondary | ICD-10-CM | POA: Diagnosis not present

## 2022-04-27 LAB — CUP PACEART REMOTE DEVICE CHECK
Battery Remaining Longevity: 95 mo
Battery Voltage: 2.99 V
Brady Statistic AP VP Percent: 82.45 %
Brady Statistic AP VS Percent: 0.04 %
Brady Statistic AS VP Percent: 17.04 %
Brady Statistic AS VS Percent: 0.47 %
Brady Statistic RA Percent Paced: 82.51 %
Brady Statistic RV Percent Paced: 99.5 %
Date Time Interrogation Session: 20240124211655
Implantable Lead Connection Status: 753985
Implantable Lead Connection Status: 753985
Implantable Lead Implant Date: 20100505
Implantable Lead Implant Date: 20100505
Implantable Lead Location: 753859
Implantable Lead Location: 753860
Implantable Lead Model: 5076
Implantable Lead Model: 5076
Implantable Pulse Generator Implant Date: 20220124
Lead Channel Impedance Value: 361 Ohm
Lead Channel Impedance Value: 380 Ohm
Lead Channel Impedance Value: 418 Ohm
Lead Channel Impedance Value: 418 Ohm
Lead Channel Pacing Threshold Amplitude: 0.625 V
Lead Channel Pacing Threshold Amplitude: 0.75 V
Lead Channel Pacing Threshold Pulse Width: 0.4 ms
Lead Channel Pacing Threshold Pulse Width: 0.4 ms
Lead Channel Sensing Intrinsic Amplitude: 0.625 mV
Lead Channel Sensing Intrinsic Amplitude: 0.625 mV
Lead Channel Sensing Intrinsic Amplitude: 4.125 mV
Lead Channel Sensing Intrinsic Amplitude: 4.125 mV
Lead Channel Setting Pacing Amplitude: 2 V
Lead Channel Setting Pacing Amplitude: 2.5 V
Lead Channel Setting Pacing Pulse Width: 0.4 ms
Lead Channel Setting Sensing Sensitivity: 1.2 mV
Zone Setting Status: 755011

## 2022-04-27 NOTE — Telephone Encounter (Signed)
She has had similar, rather lengthy episodes of nonsustained ventricular tachycardia occasionally over the last several years, consistently asymptomatic.  Normal left ventricular systolic function and no coronary artery disease.  On beta-blocker.  No additional changes recommended at this time.  Thank you

## 2022-04-27 NOTE — Telephone Encounter (Signed)
Scheduled remote reviewed. Normal device function.   There was one NSVT that fell into the monitor zone and was greater than 20 beats, sent to triage. Next remote 91 days.  Kathy Breach, RN, CCDS, [employer]  NSVT event of 23 beats occurred at 12:11pm on 03/29/22, avg V rate 146, max V 182bpm. Appears patient has history of brief episodes of VT.  Per OV note on 03/10/22 with Dr. Sallyanne Kuster: NSVT: Continues to have occasional PVCs and rare episodes of brief nonsustained VT, consistently asymptomatic.  No recorded events since this December 27th episode.

## 2022-05-16 NOTE — Progress Notes (Signed)
Remote pacemaker transmission.   

## 2022-05-26 ENCOUNTER — Ambulatory Visit (INDEPENDENT_AMBULATORY_CARE_PROVIDER_SITE_OTHER): Payer: PPO | Admitting: Podiatry

## 2022-05-26 ENCOUNTER — Ambulatory Visit (INDEPENDENT_AMBULATORY_CARE_PROVIDER_SITE_OTHER): Payer: PPO

## 2022-05-26 VITALS — BP 157/76 | HR 74

## 2022-05-26 DIAGNOSIS — M79671 Pain in right foot: Secondary | ICD-10-CM

## 2022-05-26 DIAGNOSIS — M21621 Bunionette of right foot: Secondary | ICD-10-CM

## 2022-05-26 NOTE — Progress Notes (Signed)
Chief Complaint  Patient presents with   Foot Swelling    Patient came in today for right foot 2nd toe swelling, which started 6 months ago, patient rates the pain a 4 out of 10, X-rays done today,     HPI: 67 y.o. female presenting today for evaluation of a progressive symptomatic hammertoe to the second digit of the right foot.  Patient has noticed swelling and pain associated to the toe for the past 6 months.  Pain is very consistent and she has tried different shoes to help alleviate pressure from the area with no improvement.  She also has a very symptomatic tailor's bunion that is been present for several years and increasingly symptomatic over the past year.  She also has history of correction of hammertoes in the past with bunionectomy.  Presenting for further treatment evaluation  Past Medical History:  Diagnosis Date   Allergic rhinitis    Asthmatic bronchitis    Complete heart block (HCC)    GERD (gastroesophageal reflux disease)    MRSA (methicillin resistant Staphylococcus aureus) 2013   left buttock   Nonsustained ventricular tachycardia (HCC)    Presence of permanent cardiac pacemaker 08-05-08   Medtronic Adapta   Vaginitis    Vocal cord paresis     Past Surgical History:  Procedure Laterality Date   ACROMIO-CLAVICULAR JOINT REPAIR Left 12/10/2014   Procedure: LEFT CORACOCLAVICULAR RECONSTRUCTION WITH  ZIPLOOP;  Surgeon: Tania Ade, MD;  Location: [facility];  Service: Orthopedics;  Laterality: Left;   APPENDECTOMY  05-2010   COLON SURGERY N/A    Phreesia 01/06/2020   DILATION AND CURETTAGE OF UTERUS  1978   FRACTURE SURGERY N/A    Phreesia 01/06/2020   HAND SURGERY     PACEMAKER INSERTION  08/05/08   Medtronic Adapta   PPM GENERATOR CHANGEOUT N/A 04/26/2020   Procedure: PPM GENERATOR CHANGEOUT;  Surgeon: Sanda Klein, MD;  Location: [facility];  Service: Cardiovascular;  Laterality: N/A;   Vocal cord splint  2005    Allergies   Allergen Reactions   Erythromycin    Sulfur     Other reaction(s): Unknown   Sulfa Antibiotics Rash    Urticarial rash developed 1 day after completing a 14 day course of TMP/SMX     Physical Exam: General: The patient is alert and oriented x3 in no acute distress.  Dermatology: Skin is warm, dry and supple bilateral lower extremities. Negative for open lesions or macerations.  Vascular: Palpable pedal pulses bilaterally. Capillary refill within normal limits.  Negative for any significant edema or erythema  Neurological: Light touch and protective threshold grossly intact  Musculoskeletal Exam: Symptomatic painful plantarflexed PIPJ of the second digit right foot noted.  There is also tenderness with palpation and prominence of the fifth metatarsal head of the right foot consistent with tailor's bunion deformity  Radiographic Exam RT foot 05/26/2022:  Orthopedic screws to the first metatarsal appears stable with good alignment of the first ray and reduction of the IM angle from prior surgery.  There does appear to be a's plantar subluxation of the PIPJ of the second digit with the middle phalanx plantar to the head of the proximal phalanx.  Consistent with hammertoe deformity.  Hypertrophic head of the fifth metatarsal also noted with lateral deviation consistent with tailor's bunion deformity  Assessment: 1.  Symptomatic hammertoe second digit right foot 2.  Symptomatic tailor's bunion right foot   Plan of Care:  1. Patient evaluated. X-Rays reviewed.  2.  Today we discussed conservative and surgical management of the patient's 2 symptomatic issues today.  Unfortunately she has tried multiple shoes to try and alleviate pain from her tailor's bunion and hammertoe but she continues to have pain on a daily basis.  She has had surgery before with good healing and no postoperative complications.  She would like to pursue surgical correction of the hammertoe and tailor's bunion deformity.   Risk benefits advantages and disadvantages as well as the postoperative recovery course were explained in detail.  No guarantees were expressed or implied. 3.  Authorization for surgery was initiated today.  Surgery will consist of PIPJ arthroplasty with possible MTP capsulotomy and percutaneous pin fixation second digit right.  Tailor's bunionectomy with fifth metatarsal osteotomy right. 4.  Return to clinic 1 week postop      Edrick Kins, DPM [facility]  Dr. Edrick Kins, DPM    [address], [place] [address]                Office [phone]  Fax [phone]

## 2022-06-01 ENCOUNTER — Other Ambulatory Visit: Payer: Self-pay | Admitting: Podiatry

## 2022-06-01 DIAGNOSIS — M79671 Pain in right foot: Secondary | ICD-10-CM

## 2022-06-01 DIAGNOSIS — M21621 Bunionette of right foot: Secondary | ICD-10-CM

## 2022-06-08 ENCOUNTER — Telehealth: Payer: Self-pay | Admitting: Urology

## 2022-06-08 NOTE — Telephone Encounter (Signed)
DOS - 07/06/22  METATARSAL OSTEOTOMY 5TH RIGHT --- [id number] HAMMERTOE REPAIR 2ND RIGHT --- [id number]  [employer]  [employer]   SPOKE WITH OLIVIA B. WITH [employer] AND SHE STATED THAT FOR CPT CODES 02725 AND 36644 NO PRIOR AUTH IS REQUIRED.  REF # OLIVIA B. 06/06/22 AT 8:26 AM EST   SPOKE WITH HAYLEY WITH [employer] AND SHE STATED THAT FOR CPT CODES 03474 AND 25956 HAS BEEN APPROVED, [place] 3 [id number], GOOD FROM 07/06/22 - 10/04/22.

## 2022-06-12 ENCOUNTER — Encounter: Payer: Self-pay | Admitting: Podiatry

## 2022-06-29 ENCOUNTER — Encounter: Payer: Self-pay | Admitting: Podiatry

## 2022-07-06 ENCOUNTER — Other Ambulatory Visit: Payer: Self-pay | Admitting: Podiatry

## 2022-07-06 DIAGNOSIS — M2041 Other hammer toe(s) (acquired), right foot: Secondary | ICD-10-CM | POA: Diagnosis not present

## 2022-07-06 DIAGNOSIS — M21541 Acquired clubfoot, right foot: Secondary | ICD-10-CM | POA: Diagnosis not present

## 2022-07-06 DIAGNOSIS — G8918 Other acute postprocedural pain: Secondary | ICD-10-CM | POA: Diagnosis not present

## 2022-07-06 DIAGNOSIS — M21621 Bunionette of right foot: Secondary | ICD-10-CM | POA: Diagnosis not present

## 2022-07-06 MED ORDER — OXYCODONE-ACETAMINOPHEN 5-325 MG PO TABS: 1.0000 | ORAL_TABLET | ORAL | 0 refills | Status: DC | PRN

## 2022-07-06 NOTE — Progress Notes (Signed)
PRN postop 

## 2022-07-12 ENCOUNTER — Ambulatory Visit (INDEPENDENT_AMBULATORY_CARE_PROVIDER_SITE_OTHER): Payer: PPO

## 2022-07-12 ENCOUNTER — Ambulatory Visit: Payer: PPO

## 2022-07-12 ENCOUNTER — Encounter: Payer: PPO | Admitting: Podiatry

## 2022-07-12 DIAGNOSIS — Z9889 Other specified postprocedural states: Secondary | ICD-10-CM

## 2022-07-12 NOTE — Progress Notes (Signed)
Patient presents today for post op visit # 1 , patient of Dr. Logan Bores   POV #1 DOS 07/06/22   Patient presents in walking boot. Denies any falls or injury to the foot. Foot is slightly swollen. No signs of infection. No calf pain or shortness of breath. Bandages dry and intact. Incision is intact.  Taking antibiotic as prescribed and pain medication every 4 hours    P: 74     Xrays taken today and reviewed by Dr. Logan Bores. He did take a look at the foot today as well.   Foot redressed today and placed back in the boot. Reviewed icing and elevation. Patient will follow up with Dr. Logan Bores for POV# 2.

## 2022-07-19 ENCOUNTER — Ambulatory Visit (INDEPENDENT_AMBULATORY_CARE_PROVIDER_SITE_OTHER): Payer: PPO | Admitting: Podiatry

## 2022-07-19 DIAGNOSIS — Z9889 Other specified postprocedural states: Secondary | ICD-10-CM

## 2022-07-19 NOTE — Progress Notes (Signed)
   Chief Complaint  Patient presents with   Routine Post Op    POV #2 DOS 07/06/2022 HAMMERTOE REPAIR 2ND RT, TAILORS BUNIONECTOMY RT FOOT W/OSTEOTOMY, no F/C/V/N/SOB, NO pain, Sutures were removed today     Subjective:  Patient presents today status 2nd digit hammertoe repair, tailor's bunionectomy with fifth metatarsal osteotomy RT.  DOS: 07/06/2022.  Patient doing well.  WBAT cam boot as instructed.  No new complaints at this time  Past Medical History:  Diagnosis Date   Allergic rhinitis    Asthmatic bronchitis    Complete heart block (HCC)    GERD (gastroesophageal reflux disease)    MRSA (methicillin resistant Staphylococcus aureus) 2013   left buttock   Nonsustained ventricular tachycardia (HCC)    Presence of permanent cardiac pacemaker 08-05-08   Medtronic Adapta   Vaginitis    Vocal cord paresis     Past Surgical History:  Procedure Laterality Date   ACROMIO-CLAVICULAR JOINT REPAIR Left 12/10/2014   Procedure: LEFT CORACOCLAVICULAR RECONSTRUCTION WITH  ZIPLOOP;  Surgeon: Jones Broom, MD;  Location: [facility];  Service: Orthopedics;  Laterality: Left;   APPENDECTOMY  05-2010   COLON SURGERY N/A    Phreesia 01/06/2020   DILATION AND CURETTAGE OF UTERUS  1978   FRACTURE SURGERY N/A    Phreesia 01/06/2020   HAND SURGERY     PACEMAKER INSERTION  08/05/08   Medtronic Adapta   PPM GENERATOR CHANGEOUT N/A 04/26/2020   Procedure: PPM GENERATOR CHANGEOUT;  Surgeon: Thurmon Fair, MD;  Location: [facility];  Service: Cardiovascular;  Laterality: N/A;   Vocal cord splint  2005    Allergies  Allergen Reactions   Erythromycin    Sulfur     Other reaction(s): Unknown   Sulfa Antibiotics Rash    Urticarial rash developed 1 day after completing a 14 day course of TMP/SMX    Objective/Physical Exam Neurovascular status intact.  Incision well coapted with sutures intact. No sign of infectious process noted. No dehiscence. No active bleeding noted.  No  edema.  Radiographic Exam RT foot 07/12/2022:  Orthopedic hardware and osteotomies sites appear to be stable with routine healing.  Percutaneous fixation pin intact to second right  Assessment: 1. s/p hammertoe repair second digit, tailor's bunionectomy RT. DOS: 07/06/2022  -Patient evaluated -Sutures removed -Continue WBAT cam boot x 2 weeks -Return to clinic 2 weeks for percutaneous fixation pin removal and follow-up x-ray   Felecia Shelling, DPM [facility]  Dr. Felecia Shelling, DPM    [address] [place], [place] [address]                Office [phone]  Fax [phone]

## 2022-07-27 ENCOUNTER — Ambulatory Visit (INDEPENDENT_AMBULATORY_CARE_PROVIDER_SITE_OTHER): Payer: PPO

## 2022-07-27 DIAGNOSIS — I495 Sick sinus syndrome: Secondary | ICD-10-CM

## 2022-07-27 LAB — CUP PACEART REMOTE DEVICE CHECK
Battery Remaining Longevity: 93 mo
Battery Voltage: 2.99 V
Brady Statistic AP VP Percent: 85.27 %
Brady Statistic AP VS Percent: 0.02 %
Brady Statistic AS VP Percent: 14.46 %
Brady Statistic AS VS Percent: 0.25 %
Brady Statistic RA Percent Paced: 85.19 %
Brady Statistic RV Percent Paced: 99.73 %
Date Time Interrogation Session: 20240425015803
Implantable Lead Connection Status: 753985
Implantable Lead Connection Status: 753985
Implantable Lead Implant Date: 20100505
Implantable Lead Implant Date: 20100505
Implantable Lead Location: 753859
Implantable Lead Location: 753860
Implantable Lead Model: 5076
Implantable Lead Model: 5076
Implantable Pulse Generator Implant Date: 20220124
Lead Channel Impedance Value: 361 Ohm
Lead Channel Impedance Value: 380 Ohm
Lead Channel Impedance Value: 418 Ohm
Lead Channel Impedance Value: 437 Ohm
Lead Channel Pacing Threshold Amplitude: 0.625 V
Lead Channel Pacing Threshold Amplitude: 0.75 V
Lead Channel Pacing Threshold Pulse Width: 0.4 ms
Lead Channel Pacing Threshold Pulse Width: 0.4 ms
Lead Channel Sensing Intrinsic Amplitude: 2.5 mV
Lead Channel Sensing Intrinsic Amplitude: 2.5 mV
Lead Channel Sensing Intrinsic Amplitude: 4.125 mV
Lead Channel Sensing Intrinsic Amplitude: 4.125 mV
Lead Channel Setting Pacing Amplitude: 2 V
Lead Channel Setting Pacing Amplitude: 2.5 V
Lead Channel Setting Pacing Pulse Width: 0.4 ms
Lead Channel Setting Sensing Sensitivity: 1.2 mV
Zone Setting Status: 755011

## 2022-08-02 ENCOUNTER — Ambulatory Visit (INDEPENDENT_AMBULATORY_CARE_PROVIDER_SITE_OTHER): Payer: PPO | Admitting: Podiatry

## 2022-08-02 ENCOUNTER — Ambulatory Visit (INDEPENDENT_AMBULATORY_CARE_PROVIDER_SITE_OTHER): Payer: PPO

## 2022-08-02 DIAGNOSIS — M21621 Bunionette of right foot: Secondary | ICD-10-CM | POA: Diagnosis not present

## 2022-08-02 NOTE — Progress Notes (Signed)
   Chief Complaint  Patient presents with   Routine Post Op    POV #3 DOS 07/06/2022 HAMMERTOE REPAIR 2ND RT, TAILORS BUNIONECTOMY RT FOOT W/OSTEOTOMY, patient denies any N/V/F/C/SOB, rate of pain 4 out of 10, sore, TX: cam boot X-Rays done today     Subjective:  Patient presents today status 2nd digit hammertoe repair, tailor's bunionectomy with fifth metatarsal osteotomy RT.  DOS: 07/06/2022.  Patient doing well.  WBAT cam boot as instructed.  No new complaints at this time  Past Medical History:  Diagnosis Date   Allergic rhinitis    Asthmatic bronchitis    Complete heart block (HCC)    GERD (gastroesophageal reflux disease)    MRSA (methicillin resistant Staphylococcus aureus) 2013   left buttock   Nonsustained ventricular tachycardia (HCC)    Presence of permanent cardiac pacemaker 08-05-08   Medtronic Adapta   Vaginitis    Vocal cord paresis     Past Surgical History:  Procedure Laterality Date   ACROMIO-CLAVICULAR JOINT REPAIR Left 12/10/2014   Procedure: LEFT CORACOCLAVICULAR RECONSTRUCTION WITH  ZIPLOOP;  Surgeon: Jones Broom, MD;  Location: [facility];  Service: Orthopedics;  Laterality: Left;   APPENDECTOMY  05-2010   COLON SURGERY N/A    Phreesia 01/06/2020   DILATION AND CURETTAGE OF UTERUS  1978   FRACTURE SURGERY N/A    Phreesia 01/06/2020   HAND SURGERY     PACEMAKER INSERTION  08/05/08   Medtronic Adapta   PPM GENERATOR CHANGEOUT N/A 04/26/2020   Procedure: PPM GENERATOR CHANGEOUT;  Surgeon: Thurmon Fair, MD;  Location: [facility];  Service: Cardiovascular;  Laterality: N/A;   Vocal cord splint  2005    Allergies  Allergen Reactions   Erythromycin    Sulfur     Other reaction(s): Unknown   Sulfa Antibiotics Rash    Urticarial rash developed 1 day after completing a 14 day course of TMP/SMX    Objective/Physical Exam Neurovascular status intact.  Skin incision nicely healed.  There is some mild edema localized around the surgical  forefoot.  No erythema or concern for infection.  No tenderness with palpation.  Radiographic Exam RT foot 08/02/2022:  Orthopedic hardware and osteotomies sites appear to be stable with routine healing.  Percutaneous fixation pin intact to second right  Assessment: 1. s/p hammertoe repair second digit, tailor's bunionectomy RT. DOS: 07/06/2022  -Patient evaluated - Overall the patient is doing very well.  She may now begin to transition out of the cam boot into good supportive tennis shoes and sneakers over the next 4 weeks -Return to clinic 4 weeks follow-up x-ray   Felecia Shelling, DPM [facility]  Dr. Felecia Shelling, DPM    [address] [place], [place] [address]                Office [phone]  Fax [phone]

## 2022-08-15 ENCOUNTER — Ambulatory Visit (INDEPENDENT_AMBULATORY_CARE_PROVIDER_SITE_OTHER): Payer: PPO

## 2022-08-15 ENCOUNTER — Ambulatory Visit (INDEPENDENT_AMBULATORY_CARE_PROVIDER_SITE_OTHER): Payer: PPO | Admitting: Podiatry

## 2022-08-15 ENCOUNTER — Other Ambulatory Visit: Payer: Self-pay | Admitting: Podiatry

## 2022-08-15 VITALS — BP 138/84 | HR 81 | Temp 96.3°F

## 2022-08-15 DIAGNOSIS — M21621 Bunionette of right foot: Secondary | ICD-10-CM

## 2022-08-15 NOTE — Progress Notes (Signed)
   Chief Complaint  Patient presents with   Routine Post Op    Patient came in today for right foot, patient is having some sore red spots, X-Rays taking today     Subjective:  Patient presents today status 2nd digit hammertoe repair, tailor's bunionectomy with fifth metatarsal osteotomy RT.  DOS: 07/06/2022.  Patient doing well.  She does state that she has some slight irritation from 2 areas along the incision site secondary to absorbable suture.  She would like to have them evaluated. Past Medical History:  Diagnosis Date   Allergic rhinitis    Asthmatic bronchitis    Complete heart block (HCC)    GERD (gastroesophageal reflux disease)    MRSA (methicillin resistant Staphylococcus aureus) 2013   left buttock   Nonsustained ventricular tachycardia (HCC)    Presence of permanent cardiac pacemaker 08-05-08   Medtronic Adapta   Vaginitis    Vocal cord paresis     Past Surgical History:  Procedure Laterality Date   ACROMIO-CLAVICULAR JOINT REPAIR Left 12/10/2014   Procedure: LEFT CORACOCLAVICULAR RECONSTRUCTION WITH  ZIPLOOP;  Surgeon: Jones Broom, MD;  Location: [facility];  Service: Orthopedics;  Laterality: Left;   APPENDECTOMY  05-2010   COLON SURGERY N/A    Phreesia 01/06/2020   DILATION AND CURETTAGE OF UTERUS  1978   FRACTURE SURGERY N/A    Phreesia 01/06/2020   HAND SURGERY     PACEMAKER INSERTION  08/05/08   Medtronic Adapta   PPM GENERATOR CHANGEOUT N/A 04/26/2020   Procedure: PPM GENERATOR CHANGEOUT;  Surgeon: Thurmon Fair, MD;  Location: [facility];  Service: Cardiovascular;  Laterality: N/A;   Vocal cord splint  2005    Allergies  Allergen Reactions   Erythromycin    Sulfur     Other reaction(s): Unknown   Sulfa Antibiotics Rash    Urticarial rash developed 1 day after completing a 14 day course of TMP/SMX    Objective/Physical Exam Neurovascular status intact.  Skin incision nicely healed.  There are 2 small areas of absorbable  suture that are noted along the incision sites which have a small amount of inflammatory component to them.  No purulence.  No spreading erythema that would be concerning for cellulitis or infection.  These appear to be localized inflammatory reaction secondary to observable suture  Radiographic Exam RT foot 08/15/2022:  Orthopedic hardware and osteotomies sites appear to be stable with routine healing.  Good alignment of the digits.  Assessment: 1. s/p hammertoe repair second digit, tailor's bunionectomy RT. DOS: 07/06/2022 2.  Foreign body reaction secondary to observable suture -Patient evaluated.  The visible portions of the absorbable suture were debrided away using a tissue nipper.  Patient tolerated this well.  Recommend triple antibiotic and a Band-Aid daily. - Continue good supportive tennis shoes daily as tolerated -Patient may also resume full activity no restrictions as tolerated -Return to clinic 4 weeks follow-up x-ray  Felecia Shelling, DPM [facility]  Dr. Felecia Shelling, DPM    [address] [place], [place] [address]                Office [phone]  Fax [phone]

## 2022-08-22 NOTE — Progress Notes (Signed)
Remote pacemaker transmission.   

## 2022-08-24 DIAGNOSIS — D1801 Hemangioma of skin and subcutaneous tissue: Secondary | ICD-10-CM | POA: Diagnosis not present

## 2022-08-24 DIAGNOSIS — L812 Freckles: Secondary | ICD-10-CM | POA: Diagnosis not present

## 2022-08-24 DIAGNOSIS — L821 Other seborrheic keratosis: Secondary | ICD-10-CM | POA: Diagnosis not present

## 2022-08-30 ENCOUNTER — Other Ambulatory Visit: Payer: Self-pay | Admitting: Cardiovascular Disease

## 2022-08-30 ENCOUNTER — Encounter: Payer: PPO | Admitting: Podiatry

## 2022-09-13 ENCOUNTER — Ambulatory Visit (INDEPENDENT_AMBULATORY_CARE_PROVIDER_SITE_OTHER): Payer: PPO

## 2022-09-13 ENCOUNTER — Ambulatory Visit (INDEPENDENT_AMBULATORY_CARE_PROVIDER_SITE_OTHER): Payer: PPO | Admitting: Podiatry

## 2022-09-13 DIAGNOSIS — M21621 Bunionette of right foot: Secondary | ICD-10-CM | POA: Diagnosis not present

## 2022-09-13 DIAGNOSIS — Z9889 Other specified postprocedural states: Secondary | ICD-10-CM

## 2022-09-13 NOTE — Progress Notes (Signed)
Chief Complaint  Patient presents with   Routine Post Op    POV #4 DOS 07/06/2022 HAMMERTOE REPAIR 2ND RT, TAILORS BUNIONECTOMY RT FOOT W/OSTEOTOMY, patient is still having some pain in the hallux, rate of pain 7 out of 10, aches, throbbing, No N/V/F/C/SOB     Subjective:  Patient presents today status 2nd digit hammertoe repair, tailor's bunionectomy with fifth metatarsal osteotomy RT.  DOS: 07/06/2022.  Patient is doing well.  She does have some tenderness with ambulation to the second MTP of the right foot.  This is intermittent depending on activity.  She is wearing good supportive tennis shoes.  Presenting for further treatment and evaluation  Past Medical History:  Diagnosis Date   Allergic rhinitis    Asthmatic bronchitis    Complete heart block (HCC)    GERD (gastroesophageal reflux disease)    MRSA (methicillin resistant Staphylococcus aureus) 2013   left buttock   Nonsustained ventricular tachycardia (HCC)    Presence of permanent cardiac pacemaker 08-05-08   Medtronic Adapta   Vaginitis    Vocal cord paresis     Past Surgical History:  Procedure Laterality Date   ACROMIO-CLAVICULAR JOINT REPAIR Left 12/10/2014   Procedure: LEFT CORACOCLAVICULAR RECONSTRUCTION WITH  ZIPLOOP;  Surgeon: Jones Broom, MD;  Location: [facility];  Service: Orthopedics;  Laterality: Left;   APPENDECTOMY  05-2010   COLON SURGERY N/A    Phreesia 01/06/2020   DILATION AND CURETTAGE OF UTERUS  1978   FRACTURE SURGERY N/A    Phreesia 01/06/2020   HAND SURGERY     PACEMAKER INSERTION  08/05/08   Medtronic Adapta   PPM GENERATOR CHANGEOUT N/A 04/26/2020   Procedure: PPM GENERATOR CHANGEOUT;  Surgeon: Thurmon Fair, MD;  Location: [facility];  Service: Cardiovascular;  Laterality: N/A;   Vocal cord splint  2005    Allergies  Allergen Reactions   Erythromycin    Sulfur     Other reaction(s): Unknown   Sulfa Antibiotics Rash    Urticarial rash developed 1 day after  completing a 14 day course of TMP/SMX    Objective/Physical Exam Neurovascular status intact.  Skin incision nicely healed.  Second toes in good rectus alignment.  Good range of motion of the second MTP.  There is some slight crepitus with range of motion consistent with DJD of the joint.  There is no pain with range of motion however.  Second digit is mildly edematous.  No erythema. Tailor's bunion site is nicely healed with no pain on palpation around the tailor's bunion area.  Radiographic Exam RT foot 09/13/2022:  Unchanged.  Orthopedic hardware and osteotomies sites appear to be stable with routine healing.  Good alignment of the digits.  Impression: Routine healing radiographically  Assessment: 1. s/p hammertoe repair second digit, tailor's bunionectomy RT. DOS: 07/06/2022  -Patient evaluated.  X-rays reviewed -Patient may slowly increase activity and good supportive tennis shoes and sneakers -Offloading felt metatarsal pads were applied to the insoles of the patient's shoes to offload pressure from the second MTP and forefoot -Patient wears Shon [employer]aton running shoes.  Continue -Return to clinic 3 months  Felecia Shelling, DPM [facility]  Dr. Felecia Shelling, DPM    [address], [place] [address]  Office [phone]  Fax [phone]

## 2022-09-26 ENCOUNTER — Telehealth: Payer: Self-pay | Admitting: Cardiovascular Disease

## 2022-09-26 NOTE — Telephone Encounter (Signed)
Patient states her HR was all over and now has settled.  She states she knows has Afib and has pacemaker.  Was on the deck today and felt Heart racing at 106, she states it would go up then down to 43, "allover the place and felt weird".  States lasted approx. 5 minutes. Felt some tightness as if "I ran a marathon".  No SOB, no nausea, no dizziness or lightheaded. States this is the first episode she is aware of. She does have reading from phone.  She will send via my chart. Please advise .

## 2022-09-26 NOTE — Telephone Encounter (Signed)
STAT if HR is under 50 or over 120 (normal HR is 60-100 beats per minute)  What is your heart rate? 43; 106  Do you have a log of your heart rate readings (document readings)? No   Do you have any other symptoms? Fluctuating HR up and down; kind of feeling fatigued (kind of like running a marathon)

## 2022-09-26 NOTE — Telephone Encounter (Signed)
Reviewed the patient's pacemaker download and talked to [place]. The device is functioning normally.  The last recorded arrhythmia was a 9 beat run of nonsustained ventricular tachycardia that occurred on July 7.  No arrhythmia recorded today.  No atrial fibrillation since early May. She has a history of complete heart block (pacemaker dependent) and frequent PVCs with occasional bursts of nonsustained ventricular tachycardia that have not been symptomatic in the past. She is unlikely to have palpitations from atrial fibrillation due to complete heart block.  Suspect the symptoms in the rapid variation of heart rate may have been due to very frequent PVCs.  Could also have been nonsustained VT with a rate slower than the detection zone set at 171 bpm. She has a smart watch but has not yet used it for ECG recording.  Asked her to send me samples of rhythm recordings obtained during symptoms for more precise diagnosis.  No change in medications recommended.

## 2022-10-09 ENCOUNTER — Other Ambulatory Visit: Payer: Self-pay | Admitting: Cardiovascular Disease

## 2022-10-09 DIAGNOSIS — I480 Paroxysmal atrial fibrillation: Secondary | ICD-10-CM

## 2022-10-10 NOTE — Telephone Encounter (Signed)
Prescription refill request for Xarelto received.  Indication:afib Last office visit:12/23 Weight:68  kg Age:67 Scr:1.0  10/23 CrCl:57.8  ml/min  Prescription refilled

## 2022-10-26 ENCOUNTER — Ambulatory Visit (INDEPENDENT_AMBULATORY_CARE_PROVIDER_SITE_OTHER): Payer: PPO

## 2022-10-26 DIAGNOSIS — I495 Sick sinus syndrome: Secondary | ICD-10-CM

## 2022-10-26 LAB — CUP PACEART REMOTE DEVICE CHECK
Battery Voltage: 2.99 V
Brady Statistic AP VP Percent: 77.81 %
Brady Statistic AS VP Percent: 22.07 %
Brady Statistic AS VS Percent: 0.1 %
Brady Statistic RA Percent Paced: 77.34 %
Brady Statistic RV Percent Paced: 99.88 %
Date Time Interrogation Session: 20240724215809
Implantable Lead Connection Status: 753985
Implantable Lead Connection Status: 753985
Implantable Lead Implant Date: 20100505
Implantable Lead Location: 753859
Implantable Lead Location: 753860
Implantable Lead Model: 5076
Implantable Lead Model: 5076
Implantable Pulse Generator Implant Date: 20220124
Lead Channel Impedance Value: 361 Ohm
Lead Channel Impedance Value: 475 Ohm
Lead Channel Pacing Threshold Amplitude: 0.625 V
Lead Channel Pacing Threshold Amplitude: 0.75 V
Lead Channel Pacing Threshold Pulse Width: 0.4 ms
Lead Channel Pacing Threshold Pulse Width: 0.4 ms
Lead Channel Sensing Intrinsic Amplitude: 0.875 mV
Lead Channel Setting Pacing Amplitude: 2 V
Lead Channel Setting Pacing Amplitude: 2.5 V
Lead Channel Setting Pacing Pulse Width: 0.4 ms

## 2022-11-07 NOTE — Progress Notes (Signed)
Remote pacemaker transmission.   

## 2022-11-15 DIAGNOSIS — Z1231 Encounter for screening mammogram for malignant neoplasm of breast: Secondary | ICD-10-CM | POA: Diagnosis not present

## 2022-11-15 DIAGNOSIS — F419 Anxiety disorder, unspecified: Secondary | ICD-10-CM | POA: Diagnosis not present

## 2022-11-15 DIAGNOSIS — Z124 Encounter for screening for malignant neoplasm of cervix: Secondary | ICD-10-CM | POA: Diagnosis not present

## 2022-11-15 DIAGNOSIS — Z6824 Body mass index (BMI) 24.0-24.9, adult: Secondary | ICD-10-CM | POA: Diagnosis not present

## 2022-11-15 DIAGNOSIS — F32A Depression, unspecified: Secondary | ICD-10-CM | POA: Diagnosis not present

## 2022-11-15 DIAGNOSIS — N952 Postmenopausal atrophic vaginitis: Secondary | ICD-10-CM | POA: Diagnosis not present

## 2022-12-20 ENCOUNTER — Ambulatory Visit (INDEPENDENT_AMBULATORY_CARE_PROVIDER_SITE_OTHER): Payer: PPO | Admitting: Podiatry

## 2022-12-20 DIAGNOSIS — M7751 Other enthesopathy of right foot: Secondary | ICD-10-CM | POA: Diagnosis not present

## 2022-12-20 MED ORDER — BETAMETHASONE SOD PHOS & ACET 6 (3-3) MG/ML IJ SUSP
3.0000 mg | Freq: Once | INTRAMUSCULAR | Status: AC
Start: 2022-12-20 — End: 2022-12-20
  Administered 2022-12-20: 3 mg via INTRA_ARTICULAR

## 2022-12-20 NOTE — Progress Notes (Signed)
Chief Complaint  Patient presents with   Routine Post Op    Pt still gets some pain in the toe and on the bottom of the foot. Pt stated that she feels like her toe stay sore.    Subjective:  Patient presents today status 2nd digit hammertoe repair, tailor's bunionectomy with fifth metatarsal osteotomy RT.  DOS: 07/06/2022.  Patient is doing well.  She continues to have some slight tenderness to second MTP of the right foot.  Very intermittent depending on activity.  She does admit to walking around the house barefoot which seems to exacerbate her pain  Past Medical History:  Diagnosis Date   Allergic rhinitis    Asthmatic bronchitis    Complete heart block (HCC)    GERD (gastroesophageal reflux disease)    MRSA (methicillin resistant Staphylococcus aureus) 2013   left buttock   Nonsustained ventricular tachycardia (HCC)    Presence of permanent cardiac pacemaker 08-05-08   Medtronic Adapta   Vaginitis    Vocal cord paresis     Past Surgical History:  Procedure Laterality Date   ACROMIO-CLAVICULAR JOINT REPAIR Left 12/10/2014   Procedure: LEFT CORACOCLAVICULAR RECONSTRUCTION WITH  ZIPLOOP;  Surgeon: Jones Broom, MD;  Location: [facility];  Service: Orthopedics;  Laterality: Left;   APPENDECTOMY  05-2010   COLON SURGERY N/A    Phreesia 01/06/2020   DILATION AND CURETTAGE OF UTERUS  1978   FRACTURE SURGERY N/A    Phreesia 01/06/2020   HAND SURGERY     PACEMAKER INSERTION  08/05/08   Medtronic Adapta   PPM GENERATOR CHANGEOUT N/A 04/26/2020   Procedure: PPM GENERATOR CHANGEOUT;  Surgeon: Thurmon Fair, MD;  Location: [facility];  Service: Cardiovascular;  Laterality: N/A;   Vocal cord splint  2005    Allergies  Allergen Reactions   Erythromycin    Sulfur     Other reaction(s): Unknown   Sulfa Antibiotics Rash    Urticarial rash developed 1 day after completing a 14 day course of TMP/SMX    Objective/Physical Exam Neurovascular status intact.   Skin incision nicely healed.  Second toes in good rectus alignment.  There is some joint stiffness with a little bit of limited range of motion of the second MTP.  No edema or erythema.  Radiographic Exam RT foot 09/13/2022:  Unchanged.  Orthopedic hardware and osteotomies sites appear to be stable with routine healing.  Good alignment of the digits.  Impression: Routine healing radiographically  Assessment: 1. s/p hammertoe repair second digit, tailor's bunionectomy RT. DOS: 07/06/2022 2.  Send MTP capsulitis right  -Patient evaluated.  X-rays reviewed - Due to the stiffness and tenderness to the second MTP injection 0.5 cc Celestone Soluspan injected the second MTP right -Patient wears Brooks running shoes.  Continue - Patient does admit to walking on the house barefoot.  Discontinue.  Strongly advised to wear good supportive shoes and sandals/slides even around the house.  She has hardwood floors -Return to clinic as needed  Felecia Shelling, DPM [facility]  Dr. Felecia Shelling, DPM    [address] [place], [place] [address]                Office [phone]  Fax [phone]

## 2023-01-15 DIAGNOSIS — Z980 Intestinal bypass and anastomosis status: Secondary | ICD-10-CM | POA: Diagnosis not present

## 2023-01-15 DIAGNOSIS — Z950 Presence of cardiac pacemaker: Secondary | ICD-10-CM | POA: Diagnosis not present

## 2023-01-15 DIAGNOSIS — Z13220 Encounter for screening for lipoid disorders: Secondary | ICD-10-CM | POA: Diagnosis not present

## 2023-01-15 DIAGNOSIS — Z23 Encounter for immunization: Secondary | ICD-10-CM | POA: Diagnosis not present

## 2023-01-15 DIAGNOSIS — Z0000 Encounter for general adult medical examination without abnormal findings: Secondary | ICD-10-CM | POA: Diagnosis not present

## 2023-01-15 DIAGNOSIS — F418 Other specified anxiety disorders: Secondary | ICD-10-CM | POA: Diagnosis not present

## 2023-01-15 DIAGNOSIS — I442 Atrioventricular block, complete: Secondary | ICD-10-CM | POA: Diagnosis not present

## 2023-01-15 DIAGNOSIS — G4762 Sleep related leg cramps: Secondary | ICD-10-CM | POA: Diagnosis not present

## 2023-01-15 DIAGNOSIS — Z9841 Cataract extraction status, right eye: Secondary | ICD-10-CM | POA: Diagnosis not present

## 2023-01-15 DIAGNOSIS — I480 Paroxysmal atrial fibrillation: Secondary | ICD-10-CM | POA: Diagnosis not present

## 2023-01-15 DIAGNOSIS — D6869 Other thrombophilia: Secondary | ICD-10-CM | POA: Diagnosis not present

## 2023-01-15 DIAGNOSIS — Z7901 Long term (current) use of anticoagulants: Secondary | ICD-10-CM | POA: Diagnosis not present

## 2023-01-15 DIAGNOSIS — Z9842 Cataract extraction status, left eye: Secondary | ICD-10-CM | POA: Diagnosis not present

## 2023-01-25 ENCOUNTER — Ambulatory Visit (INDEPENDENT_AMBULATORY_CARE_PROVIDER_SITE_OTHER): Payer: PPO

## 2023-01-25 DIAGNOSIS — I495 Sick sinus syndrome: Secondary | ICD-10-CM | POA: Diagnosis not present

## 2023-01-25 LAB — CUP PACEART REMOTE DEVICE CHECK
Battery Remaining Longevity: 89 mo
Battery Voltage: 2.98 V
Brady Statistic AP VP Percent: 79.16 %
Brady Statistic AP VS Percent: 0.13 %
Brady Statistic AS VP Percent: 20.31 %
Brady Statistic AS VS Percent: 0.4 %
Brady Statistic RA Percent Paced: 77.99 %
Brady Statistic RV Percent Paced: 99.45 %
Date Time Interrogation Session: 20241023221800
Implantable Lead Connection Status: 753985
Implantable Lead Connection Status: 753985
Implantable Lead Implant Date: 20100505
Implantable Lead Implant Date: 20100505
Implantable Lead Location: 753859
Implantable Lead Location: 753860
Implantable Lead Model: 5076
Implantable Lead Model: 5076
Implantable Pulse Generator Implant Date: 20220124
Lead Channel Impedance Value: 380 Ohm
Lead Channel Impedance Value: 418 Ohm
Lead Channel Impedance Value: 437 Ohm
Lead Channel Impedance Value: 456 Ohm
Lead Channel Pacing Threshold Amplitude: 0.625 V
Lead Channel Pacing Threshold Amplitude: 0.75 V
Lead Channel Pacing Threshold Pulse Width: 0.4 ms
Lead Channel Pacing Threshold Pulse Width: 0.4 ms
Lead Channel Sensing Intrinsic Amplitude: 21.375 mV
Lead Channel Sensing Intrinsic Amplitude: 21.375 mV
Lead Channel Sensing Intrinsic Amplitude: 3.25 mV
Lead Channel Sensing Intrinsic Amplitude: 3.25 mV
Lead Channel Setting Pacing Amplitude: 2 V
Lead Channel Setting Pacing Amplitude: 2.5 V
Lead Channel Setting Pacing Pulse Width: 0.4 ms
Lead Channel Setting Sensing Sensitivity: 1.2 mV
Zone Setting Status: 755011

## 2023-01-29 DIAGNOSIS — H524 Presbyopia: Secondary | ICD-10-CM | POA: Diagnosis not present

## 2023-01-29 DIAGNOSIS — Z961 Presence of intraocular lens: Secondary | ICD-10-CM | POA: Diagnosis not present

## 2023-02-12 DIAGNOSIS — H903 Sensorineural hearing loss, bilateral: Secondary | ICD-10-CM | POA: Diagnosis not present

## 2023-02-13 NOTE — Progress Notes (Signed)
Remote pacemaker transmission.   

## 2023-03-15 ENCOUNTER — Ambulatory Visit: Payer: PPO | Attending: Cardiovascular Disease | Admitting: Cardiovascular Disease

## 2023-03-15 VITALS — BP 129/81 | HR 72 | Ht 67.0 in | Wt 143.8 lb

## 2023-03-15 DIAGNOSIS — D6869 Other thrombophilia: Secondary | ICD-10-CM

## 2023-03-15 DIAGNOSIS — I480 Paroxysmal atrial fibrillation: Secondary | ICD-10-CM

## 2023-03-15 DIAGNOSIS — I4729 Other ventricular tachycardia: Secondary | ICD-10-CM

## 2023-03-15 DIAGNOSIS — I442 Atrioventricular block, complete: Secondary | ICD-10-CM | POA: Diagnosis not present

## 2023-03-15 DIAGNOSIS — E7800 Pure hypercholesterolemia, unspecified: Secondary | ICD-10-CM

## 2023-03-15 DIAGNOSIS — I495 Sick sinus syndrome: Secondary | ICD-10-CM | POA: Diagnosis not present

## 2023-03-15 DIAGNOSIS — Z950 Presence of cardiac pacemaker: Secondary | ICD-10-CM | POA: Diagnosis not present

## 2023-03-15 NOTE — Patient Instructions (Signed)

## 2023-03-17 ENCOUNTER — Encounter: Payer: Self-pay | Admitting: Cardiovascular Disease

## 2023-03-17 NOTE — Progress Notes (Signed)
Patient ID: Brandy [name], female   DOB: March 02, 1956, 67 y.o.   MRN: [id number]      Cardiology Office Note    Date:  03/17/2023   ID:  Brandy [name], DOB [birth year], MRN [id number]  PCP:  Laurann Montana, MD  Cardiologist:   Thurmon Fair, MD   No chief complaint on file.   History of Present Illness:  Brandy [name] is a 67 y.o. female with complete heart block (initial device 2010, pacemaker generator change out in January 2022 - Medtronic Azure, atrial and ventricular leads are both Medtronic 5076).  She has subsequently developed episodes of asymptomatic ventricular tachycardia (nonsustained) and paroxysmal atrial fibrillation, detected by her device.  She has also developed increased need for atrial pacing consistent with sick sinus syndrome so rate response was turned on.  She had pacemaker syndrome when her old device reached ERI (dizziness and fatigue and even intrascapular pain), all of which resolved when her pacemaker generator was changed out.  She feels well and is physically active.  She has no cardiovascular complaints.The patient specifically denies any chest pain at rest or with exertion, dyspnea at rest or with exertion, orthopnea, paroxysmal nocturnal dyspnea, syncope, palpitations, focal neurological deficits, intermittent claudication, lower extremity edema, unexplained weight gain, cough, hemoptysis or wheezing.  Pacemaker interrogation showed normal device function.  She has 82% atrial pacing and 99.7% ventricular pacing.  Heart rate histograms are slightly blunted, but she feels that she has plenty of energy.  She has only had 1 episode of nonsustained VT related to tachycardia, 10 beats long.  She has had a few brief episodes of paroxysmal atrial fibrillation, although one of them lasted for 17 hours.  The overall burden of atrial fibrillation is only 0.3%.   She had an unusual episode of loss of consciousness in May 2021 with facial injuries, tongue biting and a  prolonged period of disorientation. The next day she had documented orthostatic hypotension with her systolic blood pressure dropping from 150 down to 70. She was noted to have stenosis of the vertebral arteries on CT angiography and atorvastatin was also started.  She was already on anticoagulation for asymptomatic paroxysmal atrial fibrillation detected by her pacemaker, at that time.  She subsequently underwent an MRI that did not show any major brain abnormalities.  LV EF is 50-55%, slightly low likely due to RV pacing.  No valvular abnormalities were seen.  She has a history of normal coronary arteries and a calcium score of 0 on a coronary CT angiogram performed in July 2019.   Past Medical History:  Diagnosis Date   Allergic rhinitis    Asthmatic bronchitis    Complete heart block (HCC)    GERD (gastroesophageal reflux disease)    MRSA (methicillin resistant Staphylococcus aureus) 2013   left buttock   Nonsustained ventricular tachycardia (HCC)    Presence of permanent cardiac pacemaker 08-05-08   Medtronic Adapta   Vaginitis    Vocal cord paresis     Past Surgical History:  Procedure Laterality Date   ACROMIO-CLAVICULAR JOINT REPAIR Left 12/10/2014   Procedure: LEFT CORACOCLAVICULAR RECONSTRUCTION WITH  ZIPLOOP;  Surgeon: Jones Broom, MD;  Location: [facility];  Service: Orthopedics;  Laterality: Left;   APPENDECTOMY  05-2010   COLON SURGERY N/A    Phreesia 01/06/2020   DILATION AND CURETTAGE OF UTERUS  1978   FRACTURE SURGERY N/A    Phreesia 01/06/2020   HAND SURGERY     PACEMAKER INSERTION  08/05/08  Medtronic Adapta   PPM GENERATOR CHANGEOUT N/A 04/26/2020   Procedure: PPM GENERATOR CHANGEOUT;  Surgeon: Thurmon Fair, MD;  Location: [facility];  Service: Cardiovascular;  Laterality: N/A;   Vocal cord splint  2005    Outpatient Medications Prior to Visit  Medication Sig Dispense Refill   atorvastatin (LIPITOR) 20 MG tablet TAKE 1 TABLET BY MOUTH  DAILY 90 tablet 2   Calcium Carbonate-Vitamin D 500-125 MG-UNIT TABS Take 1 tablet by mouth daily.     fluticasone (FLONASE) 50 MCG/ACT nasal spray USE 1 SPRAY IN EACH NOSTRIL DAILY (Patient taking differently: Place 1 spray into both nostrils daily. USE 1 SPRAY IN EACH NOSTRIL DAILY) 16 g 5   metoprolol succinate (TOPROL-XL) 50 MG 24 hr tablet TAKE 1 TABLET BY MOUTH DAILY WITH OR IMMEDIATELY FOLLOWING A MEAL 90 tablet 3   sertraline (ZOLOFT) 50 MG tablet Take 50 mg by mouth daily.     XARELTO 20 MG TABS tablet TAKE 1 TABLET BY MOUTH DAILY WITH SUPPER 90 tablet 1   ALPRAZolam (XANAX) 0.25 MG tablet Take 0.25 mg by mouth at bedtime as needed for sleep.  (Patient not taking: Reported on 03/15/2023)     oxyCODONE-acetaminophen (PERCOCET) 5-325 MG tablet Take 1 tablet by mouth every 4 (four) hours as needed for severe pain. 30 tablet 0   triamcinolone cream (KENALOG) 0.1 % as needed. (Patient not taking: Reported on 03/20/2022)     valACYclovir (VALTREX) 1000 MG tablet Take 1 tablet by mouth daily as needed (cold sores).  (Patient not taking: Reported on 03/15/2023)     No facility-administered medications prior to visit.     Allergies:   Erythromycin, Sulfur, and Sulfa antibiotics   Social History   Socioeconomic History   Marital status: Widowed    Spouse name: Not on file   Number of children: Not on file   Years of education: Not on file   Highest education level: Not on file  Occupational History   Not on file  Tobacco Use   Smoking status: Never   Smokeless tobacco: Never  Vaping Use   Vaping status: Never Used  Substance and Sexual Activity   Alcohol use: Not Currently   Drug use: Never   Sexual activity: Not Currently    Birth control/protection: None  Other Topics Concern   Not on file  Social History Narrative   Not on file   Social Drivers of Health   Financial Resource Strain: Not on file  Food Insecurity: Not on file  Transportation Needs: Not on file  Physical  Activity: Not on file  Stress: Not on file  Social Connections: Not on file     Family History:  The patient's family history includes Alcohol abuse in her paternal grandfather and sister; Cancer in her maternal grandmother; Coronary artery disease in her mother; Depression in her father, mother, and sister; Diabetes in her father, maternal grandfather, and paternal uncle; Heart disease in her father, maternal grandfather, mother, and paternal grandfather; Hypertension in her father, mother, and sister.   ROS:   Please see the history of present illness.    ROS All other systems are reviewed and are negative.   PHYSICAL EXAM:   VS:  BP 129/81   Pulse 72   Ht 5\' 7"  (1.702 m)   Wt 143 lb 12.8 oz (65.2 kg)   BMI 22.52 kg/m       General: Alert, oriented x3, no distress, appears lean and fit.  The pacemaker  site is healthy. Head: no evidence of trauma, PERRL, EOMI, no exophtalmos or lid lag, no myxedema, no xanthelasma; normal ears, nose and oropharynx Neck: normal jugular venous pulsations and no hepatojugular reflux; brisk carotid pulses without delay and no carotid bruits Chest: clear to auscultation, no signs of consolidation by percussion or palpation, normal fremitus, symmetrical and full respiratory excursions Cardiovascular: normal position and quality of the apical impulse, regular rhythm, normal first and second heart sounds, no murmurs, rubs or gallops Abdomen: no tenderness or distention, no masses by palpation, no abnormal pulsatility or arterial bruits, normal bowel sounds, no hepatosplenomegaly Extremities: no clubbing, cyanosis or edema; 2+ radial, ulnar and brachial pulses bilaterally; 2+ right femoral, posterior tibial and dorsalis pedis pulses; 2+ left femoral, posterior tibial and dorsalis pedis pulses; no subclavian or femoral bruits Neurological: grossly nonfocal Psych: Normal mood and affect    Wt Readings from Last 3 Encounters:  03/15/23 143 lb 12.8 oz (65.2 kg)   03/20/22 150 lb (68 kg)  02/07/21 147 lb 6.4 oz (66.9 kg)      Studies/Labs Reviewed:  CT angiogram May 2021  IMPRESSION: 1. Atherosclerosis is mild for age in the neck, with no extracranial carotid or vertebral artery stenosis. Mild generalized arterial tortuosity. 2. Bilateral proximal subclavian artery plaque without significant stenosis. 3. Cervical spine and TMJ degeneration.  MRI of brain 10/28/2019  IMPRESSION: No acute or subacute finding. Chronic small-vessel ischemic changes affecting the brain, advanced in the pons and moderate in the cerebral hemispheric white matter.     EKG:   EKG Interpretation Date/Time:  Thursday March 15 2023 09:20:14 EST Ventricular Rate:  72 PR Interval:  178 QRS Duration:  146 QT Interval:  448 QTC Calculation: 490 R Axis:   -81  Text Interpretation: AV dual-paced rhythm When compared with ECG of 21-Aug-2019 10:29, Vent. rate has decreased BY   6 BPM Confirmed by Rodell Marrs [phone]) on 03/15/2023 7:36:21 PM         Recent Labs: No results found for requested labs within last 365 days.   Lipid Panel    Component Value Date/Time   CHOL 223 (H) 08/21/2019 2100   CHOL 215 (H) 03/28/2018 0858   TRIG 55 08/21/2019 2100   HDL 71 08/21/2019 2100   HDL 61 03/28/2018 0858   CHOLHDL 3.1 08/21/2019 2100   VLDL 11 08/21/2019 2100   LDLCALC 141 (H) 08/21/2019 2100   LDLCALC 134 (H) 03/28/2018 0858    01/10/2022 Cholesterol 159, HDL 69, LDL 74, triglycerides 87 Hemoglobin 13.5, creatinine 1.0, potassium 5.1, ALT 18  01/15/2023 Cholesterol 150, HDL 67, LDL 67, triglycerides 87, hemoglobin 13.3, potassium 4.6, ALT 19, TSH 1.09   ASSESSMENT:    1. CHB (complete heart block) (HCC)   2. NSVT (nonsustained ventricular tachycardia) (HCC)   3. Paroxysmal atrial fibrillation (HCC)   4. SSS (sick sinus syndrome) (HCC)   5. Pacemaker   6. Acquired thrombophilia (HCC)   7. Hypercholesterolemia       PLAN:  In order of  problems listed above:  CHB: Pacemaker dependent.  Early onset at age 46 or so.  Both her father and her sister received pacemakers in their 36s. SSS: Current rate response sensor settings are providing satisfactory symptom relief, even though the histograms may look a little blunted.  No changes made today. NSVT: Very infrequent, always asymptomatic. PPM: Her pacemaker system is MRI conditional.  Normal device function.  Continue remote downloads every 3 months. AFib: CHA2DS2-VASc score 2 (age and  gender).  The burden of atrial fibrillation is low at 0.3%. Anticoagulation: On the bleeding complications. HLP: Statin has excellent LDL cholesterol 67 and HDL cholesterol 67.  Appeared to have some degree of vertebral artery stenosis on CT angiography of the neck, but calcium score was 0 and she did not have any CAD on coronary CT angiography.  Labs followed by PCP    Medication Adjustments/Labs and Tests Ordered: Current medicines are reviewed at length with the patient today.  Concerns regarding medicines are outlined above.  Medication changes, Labs and Tests ordered today are listed in the Patient Instructions below. Patient Instructions  Medication Instructions:  No changes *If you need a refill on your cardiac medications before your next appointment, please call your pharmacy*  Follow-Up: At [facility], you and your health needs are our priority.  As part of our continuing mission to provide you with exceptional heart care, we have created designated Provider Care Teams.  These Care Teams include your primary Cardiologist (physician) and Advanced Practice Providers (APPs -  Physician Assistants and Nurse Practitioners) who all work together to provide you with the care you need, when you need it.  We recommend signing up for the patient portal called "MyChart".  Sign up information is provided on this After Visit Summary.  MyChart is used to connect with patients for Virtual  Visits (Telemedicine).  Patients are able to view lab/test results, encounter notes, upcoming appointments, etc.  Non-urgent messages can be sent to your provider as well.   To learn more about what you can do with MyChart, go to [email or web address].    Your next appointment:   1 year(s)  Provider:   Thurmon Fair, MD          Signed, Thurmon Fair, MD  03/17/2023 6:16 PM    [facility] [address] [place], [place], [place]  [address] Phone: [phone]; Fax: [phone]

## 2023-03-19 ENCOUNTER — Other Ambulatory Visit: Payer: Self-pay | Admitting: Cardiovascular Disease

## 2023-04-16 ENCOUNTER — Other Ambulatory Visit: Payer: Self-pay | Admitting: Cardiovascular Disease

## 2023-04-16 DIAGNOSIS — I480 Paroxysmal atrial fibrillation: Secondary | ICD-10-CM

## 2023-04-17 NOTE — Telephone Encounter (Signed)
 Prescription refill request for Xarelto received.  Indication:afib Last office visit:12/24 Weight:65.2  kg Age:68 ZOX:WRUEA labs CrCl:needs labs  Prescription refilled

## 2023-04-19 DIAGNOSIS — R944 Abnormal results of kidney function studies: Secondary | ICD-10-CM | POA: Diagnosis not present

## 2023-04-26 ENCOUNTER — Encounter: Payer: Self-pay | Admitting: Cardiovascular Disease

## 2023-04-26 ENCOUNTER — Ambulatory Visit: Payer: PPO

## 2023-04-26 DIAGNOSIS — I442 Atrioventricular block, complete: Secondary | ICD-10-CM | POA: Diagnosis not present

## 2023-04-26 LAB — CUP PACEART REMOTE DEVICE CHECK
Battery Remaining Longevity: 82 mo
Battery Voltage: 2.98 V
Brady Statistic AP VP Percent: 85.16 %
Brady Statistic AP VS Percent: 0.02 %
Brady Statistic AS VP Percent: 14.64 %
Brady Statistic AS VS Percent: 0.18 %
Brady Statistic RA Percent Paced: 84.99 %
Brady Statistic RV Percent Paced: 99.8 %
Date Time Interrogation Session: 20250122215850
Implantable Lead Connection Status: 753985
Implantable Lead Connection Status: 753985
Implantable Lead Implant Date: 20100505
Implantable Lead Implant Date: 20100505
Implantable Lead Location: 753859
Implantable Lead Location: 753860
Implantable Lead Model: 5076
Implantable Lead Model: 5076
Implantable Pulse Generator Implant Date: 20220124
Lead Channel Impedance Value: 361 Ohm
Lead Channel Impedance Value: 380 Ohm
Lead Channel Impedance Value: 399 Ohm
Lead Channel Impedance Value: 418 Ohm
Lead Channel Pacing Threshold Amplitude: 0.75 V
Lead Channel Pacing Threshold Amplitude: 0.875 V
Lead Channel Pacing Threshold Pulse Width: 0.4 ms
Lead Channel Pacing Threshold Pulse Width: 0.4 ms
Lead Channel Sensing Intrinsic Amplitude: 1.375 mV
Lead Channel Sensing Intrinsic Amplitude: 1.375 mV
Lead Channel Sensing Intrinsic Amplitude: 21.375 mV
Lead Channel Sensing Intrinsic Amplitude: 21.375 mV
Lead Channel Setting Pacing Amplitude: 2 V
Lead Channel Setting Pacing Amplitude: 2.5 V
Lead Channel Setting Pacing Pulse Width: 0.4 ms
Lead Channel Setting Sensing Sensitivity: 1.2 mV
Zone Setting Status: 755011

## 2023-05-02 DIAGNOSIS — H53483 Generalized contraction of visual field, bilateral: Secondary | ICD-10-CM | POA: Diagnosis not present

## 2023-05-02 DIAGNOSIS — H0279 Other degenerative disorders of eyelid and periocular area: Secondary | ICD-10-CM | POA: Diagnosis not present

## 2023-05-02 DIAGNOSIS — H02834 Dermatochalasis of left upper eyelid: Secondary | ICD-10-CM | POA: Diagnosis not present

## 2023-05-02 DIAGNOSIS — H02411 Mechanical ptosis of right eyelid: Secondary | ICD-10-CM | POA: Diagnosis not present

## 2023-05-02 DIAGNOSIS — H02413 Mechanical ptosis of bilateral eyelids: Secondary | ICD-10-CM | POA: Diagnosis not present

## 2023-05-02 DIAGNOSIS — H02412 Mechanical ptosis of left eyelid: Secondary | ICD-10-CM | POA: Diagnosis not present

## 2023-05-02 DIAGNOSIS — Z01818 Encounter for other preprocedural examination: Secondary | ICD-10-CM | POA: Diagnosis not present

## 2023-05-02 DIAGNOSIS — H57813 Brow ptosis, bilateral: Secondary | ICD-10-CM | POA: Diagnosis not present

## 2023-05-02 DIAGNOSIS — H02831 Dermatochalasis of right upper eyelid: Secondary | ICD-10-CM | POA: Diagnosis not present

## 2023-05-11 DIAGNOSIS — R030 Elevated blood-pressure reading, without diagnosis of hypertension: Secondary | ICD-10-CM | POA: Diagnosis not present

## 2023-05-11 DIAGNOSIS — H5789 Other specified disorders of eye and adnexa: Secondary | ICD-10-CM | POA: Diagnosis not present

## 2023-05-11 DIAGNOSIS — R194 Change in bowel habit: Secondary | ICD-10-CM | POA: Diagnosis not present

## 2023-05-11 DIAGNOSIS — H60543 Acute eczematoid otitis externa, bilateral: Secondary | ICD-10-CM | POA: Diagnosis not present

## 2023-05-14 DIAGNOSIS — H53483 Generalized contraction of visual field, bilateral: Secondary | ICD-10-CM | POA: Diagnosis not present

## 2023-05-22 ENCOUNTER — Ambulatory Visit
Admission: RE | Admit: 2023-05-22 | Discharge: 2023-05-22 | Disposition: A | Discharge status: Home / Self Care | Payer: PPO | Source: Ambulatory Visit | Attending: Nurse Practitioner | Admitting: Nurse Practitioner

## 2023-05-22 ENCOUNTER — Other Ambulatory Visit: Payer: Self-pay | Admitting: Nurse Practitioner

## 2023-05-22 DIAGNOSIS — K5900 Constipation, unspecified: Secondary | ICD-10-CM

## 2023-05-26 ENCOUNTER — Other Ambulatory Visit: Payer: Self-pay | Admitting: Cardiovascular Disease

## 2023-06-07 NOTE — Progress Notes (Signed)
 Remote pacemaker transmission.

## 2023-07-18 ENCOUNTER — Other Ambulatory Visit: Payer: Self-pay | Admitting: Cardiovascular Disease

## 2023-07-18 DIAGNOSIS — I480 Paroxysmal atrial fibrillation: Secondary | ICD-10-CM

## 2023-07-18 NOTE — Telephone Encounter (Signed)
 Prescription refill request for Xarelto received.  Indication:afib Last office visit:12/24 Weight:65.2  kg Age:68 ZOX:WRUEA labs CrCl:needs labs  Prescription refilled

## 2023-07-26 ENCOUNTER — Ambulatory Visit (INDEPENDENT_AMBULATORY_CARE_PROVIDER_SITE_OTHER): Payer: PPO

## 2023-07-26 DIAGNOSIS — I442 Atrioventricular block, complete: Secondary | ICD-10-CM

## 2023-07-26 LAB — CUP PACEART REMOTE DEVICE CHECK
Battery Remaining Longevity: 81 mo
Battery Voltage: 2.97 V
Brady Statistic AP VP Percent: 85.45 %
Brady Statistic AP VS Percent: 0.19 %
Brady Statistic AS VP Percent: 13.88 %
Brady Statistic AS VS Percent: 0.48 %
Brady Statistic RA Percent Paced: 85.54 %
Brady Statistic RV Percent Paced: 99.33 %
Date Time Interrogation Session: 20250423205139
Implantable Lead Connection Status: 753985
Implantable Lead Connection Status: 753985
Implantable Lead Implant Date: 20100505
Implantable Lead Implant Date: 20100505
Implantable Lead Location: 753859
Implantable Lead Location: 753860
Implantable Lead Model: 5076
Implantable Lead Model: 5076
Implantable Pulse Generator Implant Date: 20220124
Lead Channel Impedance Value: 342 Ohm
Lead Channel Impedance Value: 399 Ohm
Lead Channel Impedance Value: 418 Ohm
Lead Channel Impedance Value: 475 Ohm
Lead Channel Pacing Threshold Amplitude: 0.625 V
Lead Channel Pacing Threshold Amplitude: 0.75 V
Lead Channel Pacing Threshold Pulse Width: 0.4 ms
Lead Channel Pacing Threshold Pulse Width: 0.4 ms
Lead Channel Sensing Intrinsic Amplitude: 2.125 mV
Lead Channel Sensing Intrinsic Amplitude: 2.125 mV
Lead Channel Sensing Intrinsic Amplitude: 9.125 mV
Lead Channel Sensing Intrinsic Amplitude: 9.125 mV
Lead Channel Setting Pacing Amplitude: 2 V
Lead Channel Setting Pacing Amplitude: 2.5 V
Lead Channel Setting Pacing Pulse Width: 0.4 ms
Lead Channel Setting Sensing Sensitivity: 1.2 mV
Zone Setting Status: 755011

## 2023-07-27 ENCOUNTER — Telehealth: Payer: Self-pay

## 2023-07-27 ENCOUNTER — Encounter: Payer: Self-pay | Admitting: Cardiovascular Disease

## 2023-07-27 NOTE — Telephone Encounter (Signed)
 PPM scheduled remote reviewed. Normal device function.  Presenting rhythm: AP/AS-VP 3 AT/AF events. Longest x 55 min on 05/10/23. On OAC per chart.  1 Monitored VT event x > 20 beats on 05/14/23. V>A. V-rate 178bpm.  Routing to triage per protocol.   Next remote 91 days. AB, CVRS  4 minute long VT episode in February. Some undersensed R waves. Dependent. Calling to schedule with [place]u 4/30 @ 220. Spoke with patient and scheduled for 4/30 @ 220.

## 2023-08-01 ENCOUNTER — Encounter: Payer: Self-pay | Admitting: Cardiovascular Disease

## 2023-08-01 ENCOUNTER — Ambulatory Visit: Attending: Cardiovascular Disease | Admitting: Cardiovascular Disease

## 2023-08-01 VITALS — BP 146/82 | HR 76 | Ht 67.0 in | Wt 140.0 lb

## 2023-08-01 DIAGNOSIS — I495 Sick sinus syndrome: Secondary | ICD-10-CM

## 2023-08-01 DIAGNOSIS — Z01812 Encounter for preprocedural laboratory examination: Secondary | ICD-10-CM

## 2023-08-01 DIAGNOSIS — I472 Ventricular tachycardia, unspecified: Secondary | ICD-10-CM | POA: Diagnosis not present

## 2023-08-01 DIAGNOSIS — I480 Paroxysmal atrial fibrillation: Secondary | ICD-10-CM

## 2023-08-01 DIAGNOSIS — Z950 Presence of cardiac pacemaker: Secondary | ICD-10-CM

## 2023-08-01 DIAGNOSIS — E7800 Pure hypercholesterolemia, unspecified: Secondary | ICD-10-CM

## 2023-08-01 DIAGNOSIS — D6869 Other thrombophilia: Secondary | ICD-10-CM

## 2023-08-01 DIAGNOSIS — D8685 Sarcoid myocarditis: Secondary | ICD-10-CM

## 2023-08-01 DIAGNOSIS — I442 Atrioventricular block, complete: Secondary | ICD-10-CM | POA: Diagnosis not present

## 2023-08-01 NOTE — Patient Instructions (Signed)
 Medication Instructions:  No changes *If you need a refill on your cardiac medications before your next appointment, please call your pharmacy*  Lab Work: CBC, BMP- today If you have labs (blood work) drawn today and your tests are completely normal, you will receive your results only by: MyChart Message (if you have MyChart) OR A paper copy in the mail If you have any lab test that is abnormal or we need to change your treatment, we will call you to review the results.  Testing/Procedures: Your physician has requested that you have an echocardiogram. Echocardiography is a painless test that uses sound waves to create images of your heart. It provides your doctor with information about the size and shape of your heart and how well your heart's chambers and valves are working. This procedure takes approximately one hour. There are no restrictions for this procedure. Please do NOT wear cologne, perfume, aftershave, or lotions (deodorant is allowed). Please arrive 15 minutes prior to your appointment time.  Please note: We ask at that you not bring children with you during ultrasound (echo/ vascular) testing. Due to room size and safety concerns, children are not allowed in the ultrasound rooms during exams. Our front office staff cannot provide observation of children in our lobby area while testing is being conducted. An adult accompanying a patient to their appointment will only be allowed in the ultrasound room at the discretion of the ultrasound technician under special circumstances. We apologize for any inconvenience.    Your physician has requested that you have a cardiac MRI. Cardiac MRI uses a computer to create images of your heart as its beating, producing both still and moving pictures of your heart and major blood vessels. For further information please visit [email or web address]. Please follow the instruction sheet given to you today for more information.   Follow-Up: At [facility], you and your health needs are our priority.  As part of our continuing mission to provide you with exceptional heart care, our providers are all part of one team.  This team includes your primary Cardiologist (physician) and Advanced Practice Providers or APPs (Physician Assistants and Nurse Practitioners) who all work together to provide you with the care you need, when you need it.  Your next appointment:   6 month(s)  Provider:   Luana Rumple, MD    We recommend signing up for the patient portal called "MyChart".  Sign up information is provided on this After Visit Summary.  MyChart is used to connect with patients for Virtual Visits (Telemedicine).  Patients are able to view lab/test results, encounter notes, upcoming appointments, etc.  Non-urgent messages can be sent to your provider as well.   To learn more about what you can do with MyChart, go to [email or web address].

## 2023-08-01 NOTE — Progress Notes (Signed)
 Patient ID: Brandy [name], female   DOB: 02-Dec-1955, 68 y.o.   MRN: [id number]      Cardiology Office Note    Date:  08/01/2023   ID:  Brandy [name] 1955-12-16, MRN [id number]  PCP:  Victorio Grave, MD  Cardiologist:   Luana Rumple, MD   Chief Complaint  Patient presents with   Irregular Heart Beat    History of Present Illness:  Brandy [name] is a 68 y.o. female with complete heart block (initial device 2010, pacemaker generator change out in January 2022 - Medtronic Azure, atrial and ventricular leads are both Medtronic 5076).  She has subsequently developed episodes of asymptomatic ventricular tachycardia (until recently these were all nonsustained) and paroxysmal atrial fibrillation, detected by her device.  She has also developed increased need for atrial pacing consistent with sick sinus syndrome so rate response was turned on.  She had pacemaker syndrome when her old device reached ERI (dizziness and fatigue and even intrascapular pain), all of which resolved when her pacemaker generator was changed out.  Asked her to come in after her most recent device download shows an episode of sustained ventricular tachycardia.  This actually occurred a couple of months ago on 05/14/2023 but was lengthy, lasting just over 4 minutes and fairly fast at 180 bpm.  She has a hard time remembering whether she was truly symptomatic on that date, but does recall the day when she had palpitations and felt weak in February.  Otherwise she feels well and remains physically active.  She has occasional palpitations which she identifies with her longstanding PVCs.  She has not been aware of the previous episodes of atrial fibrillation.  The patient specifically denies any chest pain at rest or with exertion, dyspnea at rest or with exertion, orthopnea, paroxysmal nocturnal dyspnea, syncope, focal neurological deficits, intermittent claudication, lower extremity edema, unexplained weight gain, cough,  hemoptysis or wheezing.  Pacemaker interrogation shows normal device function.  Presenting rhythm was atrial paced, ventricular paced alternating with atrial sensed, ventricular paced.  Generator longevity is estimated at 6.7 years.  All lead parameters are stable and in normal range.  She has occasional detected R waves which are all PVCs.  She is pacemaker dependent due to complete heart block.  She has 85.3% atrial pacing and only 99.5% ventricular pacing.  Activity level has remained excellent at about 5 hours/day.  She has had 3 very brief episodes of probable atrial fibrillation since her last download, but these are very brief and the overall burden of atrial fibrillation is less than 0.1%.  In the past she has had episodes of atrial fibrillation lasting as long as 17 hours.  She had the one episode of ventricular tachycardia lasting for about 4 minutes in February, no other episodes of sustained or nonsustained VT.  Coronary CT angiography in 2019 did not show any coronary stenosis and the coronary calcium  score was 0.  She had an unusual episode of loss of consciousness in May 2021 with facial injuries, tongue biting and a prolonged period of disorientation. The next day she had documented orthostatic hypotension with her systolic blood pressure dropping from 150 down to 70. She was noted to have stenosis of the vertebral arteries on CT angiography and atorvastatin  was also started.  She was already on anticoagulation for asymptomatic paroxysmal atrial fibrillation detected by her pacemaker, at that time.  She subsequently underwent an MRI that did not show any major brain abnormalities.  LV EF is 50-55%,  slightly low likely due to RV pacing.  No valvular abnormalities were seen.  She has a history of normal coronary arteries and a calcium  score of 0 on a coronary CT angiogram performed in July 2019.   Past Medical History:  Diagnosis Date   Allergic rhinitis    Asthmatic bronchitis     Complete heart block (HCC)    GERD (gastroesophageal reflux disease)    MRSA (methicillin resistant Staphylococcus aureus) 2013   left buttock   Nonsustained ventricular tachycardia (HCC)    Presence of permanent cardiac pacemaker 08-05-08   Medtronic Adapta   Vaginitis    Vocal cord paresis     Past Surgical History:  Procedure Laterality Date   ACROMIO-CLAVICULAR JOINT REPAIR Left 12/10/2014   Procedure: LEFT CORACOCLAVICULAR RECONSTRUCTION WITH  ZIPLOOP;  Surgeon: Sammye Cristal, MD;  Location: [facility];  Service: Orthopedics;  Laterality: Left;   APPENDECTOMY  05-2010   COLON SURGERY N/A    Phreesia 01/06/2020   DILATION AND CURETTAGE OF UTERUS  1978   FRACTURE SURGERY N/A    Phreesia 01/06/2020   HAND SURGERY     PACEMAKER INSERTION  08/05/08   Medtronic Adapta   PPM GENERATOR CHANGEOUT N/A 04/26/2020   Procedure: PPM GENERATOR CHANGEOUT;  Surgeon: Luana Rumple, MD;  Location: [facility];  Service: Cardiovascular;  Laterality: N/A;   Vocal cord splint  2005    Outpatient Medications Prior to Visit  Medication Sig Dispense Refill   ALPRAZolam  (XANAX ) 0.25 MG tablet Take 0.25 mg by mouth at bedtime as needed for sleep.     atorvastatin  (LIPITOR) 20 MG tablet TAKE 1 TABLET BY MOUTH DAILY 90 tablet 2   Calcium  Carbonate-Vitamin D  500-125 MG-UNIT TABS Take 1 tablet by mouth daily.     cyanocobalamin  (VITAMIN B12) 1000 MCG tablet Take 1,000 mcg by mouth daily.     fluticasone  (FLONASE ) 50 MCG/ACT nasal spray USE 1 SPRAY IN EACH NOSTRIL DAILY (Patient taking differently: Place 1 spray into both nostrils daily. USE 1 SPRAY IN EACH NOSTRIL DAILY) 16 g 5   metoprolol  succinate (TOPROL -XL) 50 MG 24 hr tablet TAKE 1 TABLET BY MOUTH DAILY WITH OR IMMEDIATELY FOLLOWING A MEAL 90 tablet 3   rivaroxaban  (XARELTO ) 20 MG TABS tablet Take 1 tablet (20 mg total) by mouth daily with supper. NEEDS LABS FOR XARELTO  REFILLS, PLEASE COME TO OFFICE.  THANK YOU 30 tablet 0    sertraline (ZOLOFT) 50 MG tablet Take 50 mg by mouth daily.     valACYclovir (VALTREX) 1000 MG tablet Take 1 tablet by mouth daily as needed (cold sores).     No facility-administered medications prior to visit.     Allergies:   Erythromycin, Sulfur, and Sulfa  antibiotics   Family History:  The patient's family history includes Alcohol abuse in her paternal grandfather and sister; Cancer in her maternal grandmother; Coronary artery disease in her mother; Depression in her father, mother, and sister; Diabetes in her father, maternal grandfather, and paternal uncle; Heart disease in her father, maternal grandfather, mother, and paternal grandfather; Hypertension in her father, mother, and sister.   ROS:   Please see the history of present illness.    ROS All other systems are reviewed and are negative.   PHYSICAL EXAM:   VS:  BP (!) 146/82   Pulse 76   Ht 5\' 7"  (1.702 m)   Wt 63.5 kg   BMI 21.93 kg/m     General: Alert, oriented x3, no distress,  appears lean and fit.  The pacemaker site is healthy. Head: no evidence of trauma, PERRL, EOMI, no exophtalmos or lid lag, no myxedema, no xanthelasma; normal ears, nose and oropharynx Neck: normal jugular venous pulsations and no hepatojugular reflux; brisk carotid pulses without delay and no carotid bruits Chest: clear to auscultation, no signs of consolidation by percussion or palpation, normal fremitus, symmetrical and full respiratory excursions Cardiovascular: normal position and quality of the apical impulse, regular rhythm, normal first and second heart sounds, no murmurs, rubs or gallops Abdomen: no tenderness or distention, no masses by palpation, no abnormal pulsatility or arterial bruits, normal bowel sounds, no hepatosplenomegaly Extremities: no clubbing, cyanosis or edema; 2+ radial, ulnar and brachial pulses bilaterally; 2+ right femoral, posterior tibial and dorsalis pedis pulses; 2+ left femoral, posterior tibial and dorsalis pedis  pulses; no subclavian or femoral bruits Neurological: grossly nonfocal Psych: Normal mood and affect    Wt Readings from Last 3 Encounters:  08/01/23 63.5 kg  03/15/23 65.2 kg  03/20/22 68 kg      Studies/Labs Reviewed:  CT angiogram May 2021  IMPRESSION: 1. Atherosclerosis is mild for age in the neck, with no extracranial carotid or vertebral artery stenosis. Mild generalized arterial tortuosity. 2. Bilateral proximal subclavian artery plaque without significant stenosis. 3. Cervical spine and TMJ degeneration.  MRI of brain 10/28/2019  IMPRESSION: No acute or subacute finding. Chronic small-vessel ischemic changes affecting the brain, advanced in the pons and moderate in the cerebral hemispheric white matter.     EKG:   EKG Interpretation Date/Time:  Wednesday August 01 2023 14:14:27 EDT Ventricular Rate:  76 PR Interval:  178 QRS Duration:  146 QT Interval:  438 QTC Calculation: 492 R Axis:   -84  Text Interpretation: AV dual-paced rhythm When compared with ECG of 15-Mar-2023 09:20, Vent. rate has increased BY   4 BPM No significant change since last tracing Confirmed by Adianna Darwin [id number]) on 08/01/2023 4:46:44 PM         Recent Labs: No results found for requested labs within last 365 days.   Lipid Panel    Component Value Date/Time   CHOL 223 (H) 08/21/2019 2100   CHOL 215 (H) 03/28/2018 0858   TRIG 55 08/21/2019 2100   HDL 71 08/21/2019 2100   HDL 61 03/28/2018 0858   CHOLHDL 3.1 08/21/2019 2100   VLDL 11 08/21/2019 2100   LDLCALC 141 (H) 08/21/2019 2100   LDLCALC 134 (H) 03/28/2018 0858    01/10/2022 Cholesterol 159, HDL 69, LDL 74, triglycerides 87 Hemoglobin 13.5, creatinine 1.0, potassium 5.1, ALT 18  01/15/2023 Cholesterol 150, HDL 67, LDL 67, triglycerides 87, hemoglobin 13.3, potassium 4.6, ALT 19, TSH 1.09   ASSESSMENT:    1. Ventricular tachycardia, sustained (HCC)   2. CHB (complete heart block) (HCC)   3. Cardiac  sarcoidosis   4. SSS (sick sinus syndrome) (HCC)   5. Pacemaker   6. Paroxysmal atrial fibrillation (HCC)   7. Acquired thrombophilia (HCC)   8. Hypercholesterolemia   9. Pre-procedure lab exam       PLAN:  In order of problems listed above:  VT: Has had PVCs and occasional brief bursts of nonsustained VT for many years, but this is the first time she has had a lengthy episode of sustained VT.  Thankfully this has not recurred in the last 2-1/2 months.  Etiology is uncertain.  Her last echocardiogram did not show any major structural abnormalities, LVEF 50-55%, but we should update this.  Will  schedule for cardiac MRI (her device is MRI conditional as are the leads), maybe we can get a stress MRI as well to exclude CAD (but his suspicion for ischemic heart disease is very low based on previous imaging studies).  Possibly will also need a PET-CT scan if there is a question about active sarcoidosis.  Will also refer to EP to discuss defibrillator implantation, but it would be useful to have the MRI done first.   CHB: Pacemaker dependent.  Early onset at age 26 or so.  Both her father and her sister received pacemakers in their 33s.  It sounds like there is a familial inheritable conduction system abnormality, but with the current development of ventricular tachycardia also wonder about other conditions such as sarcoidosis. SSS: Physically active with appropriate heart rate histogram with current sensor settings.  Atrial pacing has increased to 85%.  No changes made today. PPM: Her pacemaker system is MRI conditional.  Normal device function.  Continue remote downloads every 3 months. AFib: CHA2DS2-VASc score 2 (age and gender).  The burden of atrial fibrillation is low at 0.1%. Anticoagulation: Tolerated well, without bleeding complications. HLP: On statin all her lipid parameters are excellent.Aaron Aas  Appeared to have some degree of vertebral artery stenosis on CT angiography of the neck, but coronary  calcium  score was 0 and she did not have any coronary stenoses on coronary CT angiography in 2019.     Medication Adjustments/Labs and Tests Ordered: Current medicines are reviewed at length with the patient today.  Concerns regarding medicines are outlined above.  Medication changes, Labs and Tests ordered today are listed in the Patient Instructions below. Patient Instructions  Medication Instructions:  No changes *If you need a refill on your cardiac medications before your next appointment, please call your pharmacy*  Lab Work: CBC, BMP- today If you have labs (blood work) drawn today and your tests are completely normal, you will receive your results only by: MyChart Message (if you have MyChart) OR A paper copy in the mail If you have any lab test that is abnormal or we need to change your treatment, we will call you to review the results.  Testing/Procedures: Your physician has requested that you have an echocardiogram. Echocardiography is a painless test that uses sound waves to create images of your heart. It provides your doctor with information about the size and shape of your heart and how well your heart's chambers and valves are working. This procedure takes approximately one hour. There are no restrictions for this procedure. Please do NOT wear cologne, perfume, aftershave, or lotions (deodorant is allowed). Please arrive 15 minutes prior to your appointment time.  Please note: We ask at that you not bring children with you during ultrasound (echo/ vascular) testing. Due to room size and safety concerns, children are not allowed in the ultrasound rooms during exams. Our front office staff cannot provide observation of children in our lobby area while testing is being conducted. An adult accompanying a patient to their appointment will only be allowed in the ultrasound room at the discretion of the ultrasound technician under special circumstances. We apologize for any  inconvenience.    Your physician has requested that you have a cardiac MRI. Cardiac MRI uses a computer to create images of your heart as its beating, producing both still and moving pictures of your heart and major blood vessels. For further information please visit [email or web address]. Please follow the instruction sheet given to you today for more information.  Follow-Up: At [facility], you and your health needs are our priority.  As part of our continuing mission to provide you with exceptional heart care, our providers are all part of one team.  This team includes your primary Cardiologist (physician) and Advanced Practice Providers or APPs (Physician Assistants and Nurse Practitioners) who all work together to provide you with the care you need, when you need it.  Your next appointment:   6 month(s)  Provider:   Luana Rumple, MD    We recommend signing up for the patient portal called "MyChart".  Sign up information is provided on this After Visit Summary.  MyChart is used to connect with patients for Virtual Visits (Telemedicine).  Patients are able to view lab/test results, encounter notes, upcoming appointments, etc.  Non-urgent messages can be sent to your provider as well.   To learn more about what you can do with MyChart, go to [email or web address].            Signed, Luana Rumple, MD  08/01/2023 4:53 PM    [facility] [address] [place], [place], [place]  [address] Phone: [phone]; Fax: [phone]

## 2023-08-02 ENCOUNTER — Encounter: Payer: Self-pay | Admitting: Cardiovascular Disease

## 2023-08-02 LAB — CBC
Hematocrit: 38.6 % (ref 34.0–46.6)
Hemoglobin: 12.8 g/dL (ref 11.1–15.9)
MCH: 31.9 pg (ref 26.6–33.0)
MCHC: 33.2 g/dL (ref 31.5–35.7)
MCV: 96 fL (ref 79–97)
Platelets: 239 x10E3/uL (ref 150–450)
RBC: 4.01 x10E6/uL (ref 3.77–5.28)
RDW: 12.3 % (ref 11.7–15.4)
WBC: 6.5 x10E3/uL (ref 3.4–10.8)

## 2023-08-02 LAB — BASIC METABOLIC PANEL WITH GFR
BUN/Creatinine Ratio: 17 (ref 12–28)
BUN: 19 mg/dL (ref 8–27)
CO2: 26 mmol/L (ref 20–29)
Calcium: 9.8 mg/dL (ref 8.7–10.3)
Chloride: 102 mmol/L (ref 96–106)
Creatinine, Ser: 1.1 mg/dL — ABNORMAL HIGH (ref 0.57–1.00)
Glucose: 92 mg/dL (ref 70–99)
Potassium: 5.5 mmol/L — ABNORMAL HIGH (ref 3.5–5.2)
Sodium: 140 mmol/L (ref 134–144)
eGFR: 54 mL/min/{1.73_m2} — ABNORMAL LOW (ref >59)

## 2023-08-18 ENCOUNTER — Other Ambulatory Visit: Payer: Self-pay | Admitting: Cardiovascular Disease

## 2023-08-18 DIAGNOSIS — I480 Paroxysmal atrial fibrillation: Secondary | ICD-10-CM

## 2023-08-20 DIAGNOSIS — K5900 Constipation, unspecified: Secondary | ICD-10-CM | POA: Diagnosis not present

## 2023-08-20 NOTE — Telephone Encounter (Signed)
 Prescription refill request for Xarelto  received.  Indication: PAF Last office visit: 08/01/23  Brandy Stable Croitoru MD Weight: 63.5kg Age: 68 Scr: 1.10 on 08/01/23  Epic CrCl: 48.39  Pt is take Xarelto  20mg  daily.  Based on above findings Xarelto  15mg  daily is the appropriate dose.  Message sent to [facility] to advise on dose reduction.

## 2023-08-21 NOTE — Telephone Encounter (Signed)
 Called pt and informed of Xarelto  dose reduction to 15mg  daily.  She verbalized understanding and Rx was sent to [facility].

## 2023-08-31 ENCOUNTER — Encounter (HOSPITAL_COMMUNITY): Payer: Self-pay | Admitting: *Deleted

## 2023-08-31 ENCOUNTER — Inpatient Hospital Stay (HOSPITAL_COMMUNITY)
Admission: RE | Admit: 2023-08-31 | Discharge: 2023-09-05 | DRG: 197 | Disposition: A | Discharge status: Home / Self Care | Attending: Cardiology | Admitting: Cardiology

## 2023-08-31 ENCOUNTER — Encounter: Payer: Self-pay | Admitting: Cardiovascular Disease

## 2023-08-31 ENCOUNTER — Emergency Department (HOSPITAL_COMMUNITY)

## 2023-08-31 ENCOUNTER — Other Ambulatory Visit: Payer: Self-pay

## 2023-08-31 DIAGNOSIS — Z811 Family history of alcohol abuse and dependence: Secondary | ICD-10-CM | POA: Diagnosis not present

## 2023-08-31 DIAGNOSIS — Z881 Allergy status to other antibiotic agents status: Secondary | ICD-10-CM

## 2023-08-31 DIAGNOSIS — I493 Ventricular premature depolarization: Secondary | ICD-10-CM | POA: Diagnosis not present

## 2023-08-31 DIAGNOSIS — I480 Paroxysmal atrial fibrillation: Secondary | ICD-10-CM | POA: Diagnosis not present

## 2023-08-31 DIAGNOSIS — I442 Atrioventricular block, complete: Secondary | ICD-10-CM | POA: Diagnosis present

## 2023-08-31 DIAGNOSIS — E785 Hyperlipidemia, unspecified: Secondary | ICD-10-CM | POA: Diagnosis not present

## 2023-08-31 DIAGNOSIS — I4891 Unspecified atrial fibrillation: Secondary | ICD-10-CM | POA: Diagnosis not present

## 2023-08-31 DIAGNOSIS — J45909 Unspecified asthma, uncomplicated: Secondary | ICD-10-CM | POA: Diagnosis present

## 2023-08-31 DIAGNOSIS — Z8249 Family history of ischemic heart disease and other diseases of the circulatory system: Secondary | ICD-10-CM

## 2023-08-31 DIAGNOSIS — Z882 Allergy status to sulfonamides status: Secondary | ICD-10-CM | POA: Diagnosis not present

## 2023-08-31 DIAGNOSIS — D8689 Sarcoidosis of other sites: Secondary | ICD-10-CM | POA: Diagnosis not present

## 2023-08-31 DIAGNOSIS — I429 Cardiomyopathy, unspecified: Secondary | ICD-10-CM | POA: Diagnosis not present

## 2023-08-31 DIAGNOSIS — I471 Supraventricular tachycardia, unspecified: Secondary | ICD-10-CM | POA: Diagnosis not present

## 2023-08-31 DIAGNOSIS — I495 Sick sinus syndrome: Secondary | ICD-10-CM | POA: Diagnosis present

## 2023-08-31 DIAGNOSIS — Z833 Family history of diabetes mellitus: Secondary | ICD-10-CM | POA: Diagnosis not present

## 2023-08-31 DIAGNOSIS — Z950 Presence of cardiac pacemaker: Secondary | ICD-10-CM | POA: Diagnosis not present

## 2023-08-31 DIAGNOSIS — I700 Atherosclerosis of aorta: Secondary | ICD-10-CM | POA: Diagnosis not present

## 2023-08-31 DIAGNOSIS — I472 Ventricular tachycardia, unspecified: Principal | ICD-10-CM | POA: Diagnosis present

## 2023-08-31 DIAGNOSIS — Z8614 Personal history of Methicillin resistant Staphylococcus aureus infection: Secondary | ICD-10-CM | POA: Diagnosis not present

## 2023-08-31 DIAGNOSIS — J42 Unspecified chronic bronchitis: Secondary | ICD-10-CM | POA: Diagnosis not present

## 2023-08-31 DIAGNOSIS — Z79899 Other long term (current) drug therapy: Secondary | ICD-10-CM | POA: Diagnosis not present

## 2023-08-31 DIAGNOSIS — Z818 Family history of other mental and behavioral disorders: Secondary | ICD-10-CM | POA: Diagnosis not present

## 2023-08-31 DIAGNOSIS — Z7901 Long term (current) use of anticoagulants: Secondary | ICD-10-CM

## 2023-08-31 DIAGNOSIS — D869 Sarcoidosis, unspecified: Secondary | ICD-10-CM | POA: Diagnosis not present

## 2023-08-31 DIAGNOSIS — I4729 Other ventricular tachycardia: Secondary | ICD-10-CM | POA: Diagnosis not present

## 2023-08-31 LAB — CBC WITH DIFFERENTIAL/PLATELET
Abs Immature Granulocytes: 0.01 10*3/uL (ref 0.00–0.07)
Basophils Absolute: 0 10*3/uL (ref 0.0–0.1)
Basophils Relative: 1 %
Eosinophils Absolute: 0.1 10*3/uL (ref 0.0–0.5)
Eosinophils Relative: 2 %
HCT: 37 % (ref 36.0–46.0)
Hemoglobin: 12.2 g/dL (ref 12.0–15.0)
Immature Granulocytes: 0 %
Lymphocytes Relative: 33 %
Lymphs Abs: 2.2 10*3/uL (ref 0.7–4.0)
MCH: 32 pg (ref 26.0–34.0)
MCHC: 33 g/dL (ref 30.0–36.0)
MCV: 97.1 fL (ref 80.0–100.0)
Monocytes Absolute: 0.4 10*3/uL (ref 0.1–1.0)
Monocytes Relative: 6 %
Neutro Abs: 3.9 10*3/uL (ref 1.7–7.7)
Neutrophils Relative %: 58 %
Platelets: 231 10*3/uL (ref 150–400)
RBC: 3.81 MIL/uL — ABNORMAL LOW (ref 3.87–5.11)
RDW: 12.4 % (ref 11.5–15.5)
WBC: 6.7 10*3/uL (ref 4.0–10.5)
nRBC: 0 % (ref 0.0–0.2)

## 2023-08-31 LAB — COMPREHENSIVE METABOLIC PANEL WITH GFR
ALT: 18 U/L (ref 0–44)
AST: 21 U/L (ref 15–41)
Albumin: 3.7 g/dL (ref 3.5–5.0)
Alkaline Phosphatase: 61 U/L (ref 38–126)
Anion gap: 10 (ref 5–15)
BUN: 19 mg/dL (ref 8–23)
CO2: 23 mmol/L (ref 22–32)
Calcium: 9.5 mg/dL (ref 8.9–10.3)
Chloride: 105 mmol/L (ref 98–111)
Creatinine, Ser: 1.01 mg/dL — ABNORMAL HIGH (ref 0.44–1.00)
GFR, Estimated: >60 mL/min (ref >60)
Glucose, Bld: 141 mg/dL — ABNORMAL HIGH (ref 70–99)
Potassium: 4.2 mmol/L (ref 3.5–5.1)
Sodium: 138 mmol/L (ref 135–145)
Total Bilirubin: 0.3 mg/dL (ref 0.0–1.2)
Total Protein: 6.4 g/dL — ABNORMAL LOW (ref 6.5–8.1)

## 2023-08-31 LAB — TROPONIN I (HIGH SENSITIVITY)
Troponin I (High Sensitivity): 12 ng/L (ref <18)
Troponin I (High Sensitivity): 12 ng/L (ref <18)

## 2023-08-31 LAB — MAGNESIUM: Magnesium: 2.4 mg/dL (ref 1.7–2.4)

## 2023-08-31 MED ORDER — ACETAMINOPHEN 325 MG PO TABS: 650.0000 mg | ORAL_TABLET | ORAL | Status: DC | PRN

## 2023-08-31 MED ORDER — ATORVASTATIN CALCIUM 10 MG PO TABS
20.0000 mg | ORAL_TABLET | Freq: Every day | ORAL | Status: DC
  Administered 2023-09-01 – 2023-09-05 (×5): 20 mg via ORAL
  Filled 2023-08-31 (×5): qty 2

## 2023-08-31 MED ORDER — ALPRAZOLAM 0.25 MG PO TABS
0.2500 mg | ORAL_TABLET | Freq: Every evening | ORAL | Status: DC | PRN
  Filled 2023-08-31: qty 1

## 2023-08-31 MED ORDER — ONDANSETRON HCL 4 MG/2ML IJ SOLN: 4.0000 mg | Freq: Four times a day (QID) | INTRAMUSCULAR | Status: DC | PRN

## 2023-08-31 MED ORDER — RIVAROXABAN 15 MG PO TABS
15.0000 mg | ORAL_TABLET | Freq: Every day | ORAL | Status: DC
  Administered 2023-08-31: 15 mg via ORAL
  Filled 2023-08-31: qty 1

## 2023-08-31 MED ORDER — METOPROLOL SUCCINATE ER 25 MG PO TB24
25.0000 mg | ORAL_TABLET | Freq: Once | ORAL | Status: AC
  Administered 2023-08-31: 25 mg via ORAL
  Filled 2023-08-31: qty 1

## 2023-08-31 MED ORDER — METOPROLOL SUCCINATE ER 50 MG PO TB24
75.0000 mg | ORAL_TABLET | Freq: Every day | ORAL | Status: DC
  Administered 2023-09-01 – 2023-09-04 (×4): 75 mg via ORAL
  Filled 2023-08-31 (×4): qty 1

## 2023-08-31 MED ORDER — SERTRALINE HCL 50 MG PO TABS
50.0000 mg | ORAL_TABLET | Freq: Every day | ORAL | Status: DC
  Administered 2023-09-01 – 2023-09-05 (×5): 50 mg via ORAL
  Filled 2023-08-31 (×5): qty 1

## 2023-08-31 NOTE — Telephone Encounter (Signed)
 Remote transmission received and reviewed. 1 sustained event of VT noted on pacemaker. Patient denies missing any toprol -XL or recent sickness or illness.   Reviewed with Dr. Marven Slimmer in office and advised patient to go to ER for evaluation. Patient called and advised to go to [place] [facility] for evaluation. Advised patient the type of arrhythmia that occurred is not able to be treated with a pacemaker. Patient advised not to drive. States she will go in 10 minutes after her daughter comes to pick her up.  Patient voiced understanding and agreeable to plan.

## 2023-08-31 NOTE — ED Provider Triage Note (Signed)
 Emergency Medicine Provider Triage Evaluation Note  Brandy [name] , a 68 y.o. female  was evaluated in triage.  Pt complains of brief episode of tachycardia earlier this morning.  This manage shortness of breath.  History of SVT.  Review of Systems  Positive: none Negative: none  Physical Exam  There were no vitals taken for this visit. Gen:   Awake, no distress   Resp:  Normal effort  MSK:   Moves extremities without difficulty, left lower extremity 2+ pitting edema (reported to be chronic, no pain) Other:    Medical Decision Making  Medically screening exam initiated at 4:18 PM.  Appropriate orders placed.  Brandy [name] was informed that the remainder of the evaluation will be completed by another provider, this initial triage assessment does not replace that evaluation, and the importance of remaining in the ED until their evaluation is complete.     Felicie Horning, PA-C 08/31/23 1629

## 2023-08-31 NOTE — ED Triage Notes (Signed)
 The pt has had a rapid heart rate since 1000 this am she has a history of the same no chest pain radial pulse is speeding and slowing down    hx of svt

## 2023-08-31 NOTE — ED Notes (Signed)
 ED TO INPATIENT HANDOFF REPORT  ED Nurse Name and Phone #: Nerissa Bannister [phone]  S Name/Age/Gender Brandy [name] 68 y.o. female Room/Bed: 030C/030C  Code Status   Code Status: Full Code  Home/SNF/Other Home Patient oriented to: self, place, time, and situation Is this baseline? Yes   Triage Complete: Triage complete  Chief Complaint Ventricular tachycardia (HCC) [I47.20]  Triage Note The pt has had a rapid heart rate since 1000 this am she has a history of the same no chest pain radial pulse is speeding and slowing down    hx of svt   Allergies Allergies  Allergen Reactions   Erythromycin    Sulfur     Other reaction(s): Unknown   Sulfa  Antibiotics Rash    Urticarial rash developed 1 day after completing a 14 day course of TMP/SMX    Level of Care/Admitting Diagnosis ED Disposition     ED Disposition  Admit   Condition  --   Comment  Hospital Area: [facility] [[id number]]  Level of Care: Telemetry Cardiac [[id number]]  May admit patient to Ar[facility] Benes or [facility] Smalling if equivalent level of care is available:: No  Covid Evaluation: Asymptomatic - no recent exposure (last 10 days) testing not required  Diagnosis: Ventricular tachycardia [facility]) [[id number]]  Admitting Physician: Laurann Pollock [[id number]]  Attending Physician: Laurann Pollock [id number]  Certification:: I certify this patient will need inpatient services for at least 2 midnights  Expected Medical Readiness: 09/03/2023          B Medical/Surgery History Past Medical History:  Diagnosis Date   Allergic rhinitis    Asthmatic bronchitis    Complete heart block (HCC)    GERD (gastroesophageal reflux disease)    MRSA (methicillin resistant Staphylococcus aureus) 2013   left buttock   Nonsustained ventricular tachycardia (HCC)    Presence of permanent cardiac pacemaker 08-05-08   [employer] Adapta   Vaginitis    Vocal cord paresis    Past Surgical History:  Procedure Laterality Date    ACROMIO-CLAVICULAR JOINT REPAIR Left 12/10/2014   Procedure: LEFT CORACOCLAVICULAR RECONSTRUCTION WITH  ZIPLOOP;  Surgeon: Sammye Cristal, MD;  Location: [facility];  Service: Orthopedics;  Laterality: Left;   APPENDECTOMY  05-2010   COLON SURGERY N/A    [employer]rees[employer] 01/06/2020   DILATION AND CURETTAGE OF UTERUS  1978   FRACTURE SURGERY N/A    [employer] 01/06/2020   HAND SURGERY     PACEMAKER INSERTION  08/05/08   [employer] Adapta   PPM GENERATOR CHANGEOUT N/A 04/26/2020   Procedure: PPM GENERATOR CHANGEOUT;  Surgeon: Luana Rumple, MD;  Location: [facility];  Service: Cardiovascular;  Laterality: N/A;   Vocal cord splint  2005     A IV Location/Drains/Wounds Patient Lines/Drains/Airways Status     Active Line/Drains/Airways     Name Placement date Placement time Site Days   Peripheral IV 08/31/23 20 G Right Antecubital 08/31/23  1810  Antecubital  less than 1            Intake/Output Last 24 hours No intake or output data in the 24 hours ending 08/31/23 1954  Labs/Imaging Results for orders placed or performed during the hospital encounter of 08/31/23 (from the past 48 hours)  CBC with Differential     Status: Abnormal   Collection Time: 08/31/23  4:18 PM  Result Value Ref Range   WBC 6.7 4.0 - 10.5 K/uL   RBC 3.81 (L) 3.87 - 5.11 MIL/uL  Hemoglobin 12.2 12.0 - 15.0 g/dL   HCT 62.9 52.8 - 41.3 %   MCV 97.1 80.0 - 100.0 fL   MCH 32.0 26.0 - 34.0 pg   MCHC 33.0 30.0 - 36.0 g/dL   RDW 24.4 01.0 - 27.2 %   Platelets 231 150 - 400 K/uL   nRBC 0.0 0.0 - 0.2 %   Neutrophils Relative % 58 %   Neutro Abs 3.9 1.7 - 7.7 K/uL   Lymphocytes Relative 33 %   Lymphs Abs 2.2 0.7 - 4.0 K/uL   Monocytes Relative 6 %   Monocytes Absolute 0.4 0.1 - 1.0 K/uL   Eosinophils Relative 2 %   Eosinophils Absolute 0.1 0.0 - 0.5 K/uL   Basophils Relative 1 %   Basophils Absolute 0.0 0.0 - 0.1 K/uL   Immature Granulocytes 0 %   Abs Immature Granulocytes 0.01 0.00 - 0.07  K/uL    Comment: Performed at [facility], [address], [place], [place] [address]  Comprehensive metabolic panel     Status: Abnormal   Collection Time: 08/31/23  4:18 PM  Result Value Ref Range   Sodium 138 135 - 145 mmol/L   Potassium 4.2 3.5 - 5.1 mmol/L   Chloride 105 98 - 111 mmol/L   CO2 23 22 - 32 mmol/L   Glucose, Bld 141 (H) 70 - 99 mg/dL    Comment: Glucose reference range applies only to samples taken after fasting for at least 8 hours.   BUN 19 8 - 23 mg/dL   Creatinine, Ser 4.03 (H) 0.44 - 1.00 mg/dL   Calcium  9.5 8.9 - 10.3 mg/dL   Total Protein 6.4 (L) 6.5 - 8.1 g/dL   Albumin 3.7 3.5 - 5.0 g/dL   AST 21 15 - 41 U/L   ALT 18 0 - 44 U/L   Alkaline Phosphatase 61 38 - 126 U/L   Total Bilirubin 0.3 0.0 - 1.2 mg/dL   GFR, Estimated >47 >42 mL/min    Comment: (NOTE) Calculated using the CKD-EPI Creatinine Equation (2021)    Anion gap 10 5 - 15    Comment: Performed at [facility], [address] [place], [place] [address]  Troponin I (High Sensitivity)     Status: None   Collection Time: 08/31/23  4:18 PM  Result Value Ref Range   Troponin I (High Sensitivity) 12 <18 ng/L    Comment: (NOTE) Elevated high sensitivity troponin I (hsTnI) values and significant  changes across serial measurements may suggest ACS but many other  chronic and acute conditions are known to elevate hsTnI results.  Refer to the "Links" section for chest pain algorithms and additional  guidance. Performed at [facility], [address] [place], [place] [address]   Magnesium     Status: None   Collection Time: 08/31/23  4:18 PM  Result Value Ref Range   Magnesium 2.4 1.7 - 2.4 mg/dL    Comment: Performed at [facility], [address] [place], [place] [address]  Troponin I (High Sensitivity)     Status: None   Collection Time: 08/31/23  6:10 PM  Result Value Ref Range   Troponin I (High Sensitivity) 12 <18 ng/L    Comment: (NOTE) Elevated high  sensitivity troponin I (hsTnI) values and significant  changes across serial measurements may suggest ACS but many other  chronic and acute conditions are known to elevate hsTnI results.  Refer to the "Links" section for chest  pain algorithms and additional  guidance. Performed at [facility], [address] [place], [place] [address]    DG Chest 2 View Result Date: 08/31/2023 CLINICAL DATA:  SVT EXAM: CHEST - 2 VIEW COMPARISON:  08/21/2019 FINDINGS: Left-sided pacing device. No acute airspace disease or pleural effusion. Stable cardiomediastinal silhouette with aortic atherosclerosis. Apical pleuroparenchymal scarring. Mild chronic appearing bronchitic changes. IMPRESSION: No active cardiopulmonary disease. Mild chronic appearing bronchitic changes. Electronically Signed   By: Esmeralda Hedge M.D.   On: 08/31/2023 18:58    Pending Labs Unresulted Labs (From admission, onward)     Start     Ordered   09/01/23 0500  TSH  Tomorrow morning,   R        08/31/23 1851   Signed and Held  Basic metabolic panel  Tomorrow morning,   R        Signed and Held   Signed and Held  CBC  Tomorrow morning,   R        Signed and Held            Vitals/Pain Today's Vitals   08/31/23 1622 08/31/23 1623 08/31/23 1930 08/31/23 1945  BP:  137/71 134/82   Pulse:  76 71   Resp:  20 20   Temp:  98.4 F (36.9 C) 97.9 F (36.6 C)   TempSrc:   Oral   SpO2:  99% 97%   Weight: 63.5 kg     Height: 5\' 7"  (1.702 m)     PainSc: 0-No pain  0-No pain 0-No pain    Isolation Precautions No active isolations  Medications Medications  metoprolol  succinate (TOPROL -XL) 24 hr tablet 25 mg (25 mg Oral Given 08/31/23 1943)    Mobility walks     Focused Assessments Cardiac Assessment Handoff:  Cardiac Rhythm: Bundle branch block, Normal sinus rhythm No results found for: "CKTOTAL", "CKMB", "CKMBINDEX", "TROPONINI" No results found for: "DDIMER" Does the Patient currently have chest pain? No     R Recommendations: See Admitting Provider Note  Report given to:   Additional Notes: Daughter at bedside.

## 2023-08-31 NOTE — ED Provider Notes (Signed)
 [place] EMERGENCY DEPARTMENT AT [facility] Provider Note   CSN: [id number] Arrival date & time: 08/31/23  1615     History  Chief Complaint  Patient presents with   Tachycardia    Brandy [name] is a 68 y.o. female who presents today due to an episode of profound tachycardia that occurred while she was at home.  She has a cardiac pacemaker and notes that while she was at home her heart rate was at 199.  Further she states that she was experiencing palpitations and chest tightness during this episode which lasted for approximately 1 to 2 minutes.  This is resolved, and at this time she has no residual symptoms but is presenting due to concern for the episode of tachycardia as she had previously had an episode in February that lasted for 4 minutes and required further workup.  Of note she does have a appointment with her cardiologist for an echocardiogram on this coming Monday.  She has a notable previous medical diagnoses of nonsustained V. tach, paroxysmal atrial fibrillation, sick sinus syndrome.  HPI     Home Medications Prior to Admission medications   Medication Sig Start Date End Date Taking? Authorizing Provider  ALPRAZolam  (XANAX ) 0.25 MG tablet Take 0.25 mg by mouth at bedtime as needed for sleep.    [provider]  atorvastatin  (LIPITOR) 20 MG tablet TAKE 1 TABLET BY MOUTH DAILY 05/28/23   Croitoru, Mihai, MD  Calcium  Carbonate-Vitamin D  500-125 MG-UNIT TABS Take 1 tablet by mouth daily.    [provider]  cyanocobalamin  (VITAMIN B12) 1000 MCG tablet Take 1,000 mcg by mouth daily. 05/11/23   [provider]  fluticasone  (FLONASE ) 50 MCG/ACT nasal spray USE 1 SPRAY IN EACH NOSTRIL DAILY Patient taking differently: Place 1 spray into both nostrils daily. USE 1 SPRAY IN EACH NOSTRIL DAILY 10/30/16   Young, Rupert Counts D, MD  metoprolol  succinate (TOPROL -XL) 50 MG 24 hr tablet TAKE 1 TABLET BY MOUTH DAILY WITH OR IMMEDIATELY FOLLOWING A MEAL  03/20/23   Croitoru, Mihai, MD  Rivaroxaban  (XARELTO ) 15 MG TABS tablet Take 1 tablet (15 mg total) by mouth daily with supper. 08/21/23   Croitoru, Mihai, MD  sertraline  (ZOLOFT ) 50 MG tablet Take 50 mg by mouth daily. 12/07/20   [provider]  valACYclovir (VALTREX) 1000 MG tablet Take 1 tablet by mouth daily as needed (cold sores). 06/16/16   [provider]      Allergies    Erythromycin, Sulfur, and Sulfa  antibiotics    Review of Systems   Review of Systems  Cardiovascular:  Positive for palpitations.  All other systems reviewed and are negative.   Physical Exam Updated Vital Signs BP 137/71   Pulse 76   Temp 98.4 F (36.9 C)   Resp 20   Ht 5\' 7"  (1.702 m)   Wt 63.5 kg   SpO2 99%   BMI 21.93 kg/m  Physical Exam Vitals and nursing note reviewed.  Constitutional:      General: She is not in acute distress.    Appearance: Normal appearance.  HENT:     Head: Normocephalic and atraumatic.     Mouth/Throat:     Mouth: Mucous membranes are moist.     Pharynx: Oropharynx is clear.  Eyes:     Extraocular Movements: Extraocular movements intact.     Conjunctiva/sclera: Conjunctivae normal.     Pupils: Pupils are equal, round, and reactive to light.  Cardiovascular:     Rate and  Rhythm: Normal rate and regular rhythm.     Pulses: Normal pulses.     Heart sounds: Normal heart sounds. No murmur heard.    No friction rub. No gallop.  Pulmonary:     Effort: Pulmonary effort is normal.     Breath sounds: Normal breath sounds.  Abdominal:     General: Abdomen is flat. Bowel sounds are normal.     Palpations: Abdomen is soft.  Musculoskeletal:        General: Normal range of motion.     Cervical back: Normal range of motion and neck supple.     Right lower leg: No edema.     Left lower leg: 2+ Pitting Edema present.  Skin:    General: Skin is warm and dry.     Capillary Refill: Capillary refill takes less than 2 seconds.  Neurological:     General: No  focal deficit present.     Mental Status: She is alert. Mental status is at baseline.  Psychiatric:        Mood and Affect: Mood normal.     ED Results / Procedures / Treatments   Labs (all labs ordered are listed, but only abnormal results are displayed) Labs Reviewed  CBC WITH DIFFERENTIAL/PLATELET - Abnormal; Notable for the following components:      Result Value   RBC 3.81 (*)    All other components within normal limits  COMPREHENSIVE METABOLIC PANEL WITH GFR - Abnormal; Notable for the following components:   Glucose, Bld 141 (*)    Creatinine, Ser 1.01 (*)    Total Protein 6.4 (*)    All other components within normal limits  MAGNESIUM  TSH  TROPONIN I (HIGH SENSITIVITY)  TROPONIN I (HIGH SENSITIVITY)    EKG None  Radiology DG Chest 2 View Result Date: 08/31/2023 CLINICAL DATA:  SVT EXAM: CHEST - 2 VIEW COMPARISON:  08/21/2019 FINDINGS: Left-sided pacing device. No acute airspace disease or pleural effusion. Stable cardiomediastinal silhouette with aortic atherosclerosis. Apical pleuroparenchymal scarring. Mild chronic appearing bronchitic changes. IMPRESSION: No active cardiopulmonary disease. Mild chronic appearing bronchitic changes. Electronically Signed   By: Esmeralda Hedge M.D.   On: 08/31/2023 18:58    Procedures Procedures    Medications Ordered in ED Medications - No data to display  ED Course/ Medical Decision Making/ A&P                                 Medical Decision Making Risk Decision regarding hospitalization.   Medical Decision Making:   Brandy [name] is a 68 y.o. female who presented to the ED today with episode of palpitations and rapid heart rate detailed above.    Additional history discussed with patient's family/caregivers.  External chart has been reviewed including cardiology records. Patient's presentation is complicated by their history of A-fib and sick sinus syndrome.  Patient placed on continuous vitals and telemetry  monitoring while in ED which was reviewed periodically.  Complete initial physical exam performed, notably the patient  was alert and oriented in no apparent distress.  Cardiac and pulmonary auscultation are unremarkable.  Noted +2 pitting edema in the left lower extremity for the ankle up to the mid calf.  Patient states this is normal for her and is not increased over the past several days.  Remainder of physical exam is unremarkable.  Reviewed and confirmed nursing documentation for past medical history, family history, social history.  Initial Assessment:   With the patient's presentation of palpitations, most likely diagnosis is potential run of atrial fibrillation or ventricular tachycardia. Other diagnoses were considered including (but not limited to) failed pacemaker. These are considered less likely due to history of present illness and physical exam findings.     Initial Plan:  Interrogate pacemaker Serum magnesium level Screening labs including CBC and Metabolic panel to evaluate for infectious or metabolic etiology of disease.  CXR to evaluate for structural/infectious intrathoracic pathology.  EKG and serial troponin to evaluate for cardiac pathology. Objective evaluation as below reviewed   Initial Study Results:   Laboratory  All laboratory results reviewed without evidence of clinically relevant pathology.   Exceptions include: None  EKG EKG was reviewed independently. Rate, rhythm, axis, intervals all examined and without medically relevant abnormality. ST segments without concerns for elevations.    Radiology:  All images reviewed independently. Agree with radiology report at this time.   DG Chest 2 View Result Date: 08/31/2023 CLINICAL DATA:  SVT EXAM: CHEST - 2 VIEW COMPARISON:  08/21/2019 FINDINGS: Left-sided pacing device. No acute airspace disease or pleural effusion. Stable cardiomediastinal silhouette with aortic atherosclerosis. Apical pleuroparenchymal  scarring. Mild chronic appearing bronchitic changes. IMPRESSION: No active cardiopulmonary disease. Mild chronic appearing bronchitic changes. Electronically Signed   By: Esmeralda Hedge M.D.   On: 08/31/2023 18:58      Consults: Case discussed with Dr. Amanda Jungling with cardiology, will admit to cardiology service..   Reassessment and Plan:   Based on report received from [employer] indicating a run of V. tach for a minute and 36 seconds at around 1030 this morning, previous history of V. tach, patient will be admitted as noted previously to cardiology service for further management.  This was thoroughly discussed with the patient and she is in agreement with this.  Further workup did not show any acute medical pathology.  She is hemodynamically stable with a stable paced rhythm, chest x-ray was unremarkable, lab workup was unremarkable.          Final Clinical Impression(s) / ED Diagnoses Final diagnoses:  None    Rx / DC Orders ED Discharge Orders     None         Juanetta Nordmann, PA 08/31/23 1905    Teddi Favors, DO 09/01/23 2115

## 2023-08-31 NOTE — ED Notes (Signed)
 CCMD called.

## 2023-08-31 NOTE — H&P (Addendum)
 Cardiology Admission History and Physical   Patient ID: DYAMOND TOLOSA MRN: [id number]; DOB: 09/27/1955   Admission date: 08/31/2023  PCP:  Victorio Grave, MD   [facility] Providers Cardiologist:  Luana Rumple, MD   Chief Complaint:  Palpitations  Patient Profile: Brandy [name] is a 68 y.o. female with istory of complete heart block with pacemaker, SSS, prior history of NSVT/VT, PAF who is being seen 08/31/2023 for the evaluation of palpitations and ventricular tachycardia.  History of Present Illness: Ms. Brandy [name] 68 yo female history of complete heart block with pacemaker, SSS, prior history of NSVT/VT, PAF, presents from home with episode of palpitations. Remote device check of her pacemaker showed a 1 min 42 second episode of VT corresponding with her symptoms. She was asked to come to the ER for evaluation. She reports was sitting at the table, sudden onset of feeling lightheaded and severe palpitations lasted about 1-2 minutes and then resolved. She denies any recent history of chest pains, SOB. Remains very active, walks up to 1 hour a day without exertional symptoms.   Long history of PVCs. Has had NSVT/VT noted by prior device checks. From notes long history of NSVT on device checks.  Episode of sustained VT noted 05/14/2023, lasting 4 minutes. She did not recall if she was symptomatic. Seen by Dr Alvis Ba 07/2023 with plans for echo and cMRI, as well as EP referral.   WBC 6.7 Hgb 12.2 Plt 231 K 4.2 Cr 1.01 BUN 19 Mg 2.4 Trop 12 EKG AV paced CXR pending   08/2019 echo: LVEF 50-55%, no WMAs, indet diastolic function, normal RV 2019 coronary CTA: no coronary disease  Past Medical History:  Diagnosis Date   Allergic rhinitis    Asthmatic bronchitis    Complete heart block (HCC)    GERD (gastroesophageal reflux disease)    MRSA (methicillin resistant Staphylococcus aureus) 2013   left buttock   Nonsustained ventricular tachycardia (HCC)    Presence of  permanent cardiac pacemaker 08-05-08   Medtronic Adapta   Vaginitis    Vocal cord paresis    Past Surgical History:  Procedure Laterality Date   ACROMIO-CLAVICULAR JOINT REPAIR Left 12/10/2014   Procedure: LEFT CORACOCLAVICULAR RECONSTRUCTION WITH  ZIPLOOP;  Surgeon: Sammye Cristal, MD;  Location: [facility];  Service: Orthopedics;  Laterality: Left;   APPENDECTOMY  05-2010   COLON SURGERY N/A    [employer]rees[employer] 01/06/2020   DILATION AND CURETTAGE OF UTERUS  1978   FRACTURE SURGERY N/A    [employer] 01/06/2020   HAND SURGERY     PACEMAKER INSERTION  08/05/08   Medtronic Adapta   PPM GENERATOR CHANGEOUT N/A 04/26/2020   Procedure: PPM GENERATOR CHANGEOUT;  Surgeon: Luana Rumple, MD;  Location: [facility];  Service: Cardiovascular;  Laterality: N/A;   Vocal cord splint  2005     Medications Prior to Admission: Prior to Admission medications   Medication Sig Start Date End Date Taking? Authorizing Provider  ALPRAZolam  (XANAX ) 0.25 MG tablet Take 0.25 mg by mouth at bedtime as needed for sleep.    [provider]  atorvastatin  (LIPITOR) 20 MG tablet TAKE 1 TABLET BY MOUTH DAILY 05/28/23   Croitoru, Mihai, MD  Calcium  Carbonate-Vitamin D  500-125 MG-UNIT TABS Take 1 tablet by mouth daily.    [provider]  cyanocobalamin  (VITAMIN B12) 1000 MCG tablet Take 1,000 mcg by mouth daily. 05/11/23   [provider]  fluticasone  (FLONASE ) 50 MCG/ACT nasal spray USE 1 SPRAY IN [facility]  NOSTRIL DAILY Patient taking differently: Place 1 spray into both nostrils daily. USE 1 SPRAY IN EACH NOSTRIL DAILY 10/30/16   Young, Rupert Counts D, MD  metoprolol  succinate (TOPROL -XL) 50 MG 24 hr tablet TAKE 1 TABLET BY MOUTH DAILY WITH OR IMMEDIATELY FOLLOWING A MEAL 03/20/23   Croitoru, Mihai, MD  Rivaroxaban  (XARELTO ) 15 MG TABS tablet Take 1 tablet (15 mg total) by mouth daily with supper. 08/21/23   Croitoru, Mihai, MD  sertraline (ZOLOFT) 50 MG tablet Take 50 mg by mouth daily.  12/07/20   [provider]  valACYclovir (VALTREX) 1000 MG tablet Take 1 tablet by mouth daily as needed (cold sores). 06/16/16   [provider]     Allergies:    Allergies  Allergen Reactions   Erythromycin    Sulfur     Other reaction(s): Unknown   Sulfa  Antibiotics Rash    Urticarial rash developed 1 day after completing a 14 day course of TMP/SMX    Social History:   Social History   Socioeconomic History   Marital status: Widowed    Spouse name: Not on file   Number of children: Not on file   Years of education: Not on file   Highest education level: Not on file  Occupational History   Not on file  Tobacco Use   Smoking status: Never   Smokeless tobacco: Never  Vaping Use   Vaping status: Never Used  Substance and Sexual Activity   Alcohol use: Not Currently   Drug use: Never   Sexual activity: Not Currently    Birth control/protection: None  Other Topics Concern   Not on file  Social History Narrative   Not on file   Social Drivers of Health   Financial Resource Strain: Not on file  Food Insecurity: Not on file  Transportation Needs: Not on file  Physical Activity: Not on file  Stress: Not on file  Social Connections: Not on file  Intimate Partner Violence: Not on file     Family History:   The patient's family history includes Alcohol abuse in her paternal grandfather and sister; Cancer in her maternal grandmother; Coronary artery disease in her mother; Depression in her father, mother, and sister; Diabetes in her father, maternal grandfather, and paternal uncle; Heart disease in her father, maternal grandfather, mother, and paternal grandfather; Hypertension in her father, mother, and sister.    ROS:  Please see the history of present illness.  All other ROS reviewed and negative.     Physical Exam/Data: Vitals:   08/31/23 1622 08/31/23 1623  BP:  137/71  Pulse:  76  Resp:  20  Temp:  98.4 F (36.9 C)  SpO2:  99%  Weight: 63.5  kg   Height: 5\' 7"  (1.702 m)    No intake or output data in the 24 hours ending 08/31/23 1843    08/31/2023    4:22 PM 08/01/2023    2:14 PM 03/15/2023    9:12 AM  Last 3 Weights  Weight (lbs) 139 lb 15.9 oz 140 lb 143 lb 12.8 oz  Weight (kg) 63.5 kg 63.504 kg 65.227 kg     Body mass index is 21.93 kg/m.  General:  Well nourished, well developed, in no acute distress HEENT: normal Neck: no JVD Vascular: No carotid bruits; Distal pulses 2+ bilaterally   Cardiac:  normal S1, S2; RRR; no murmur  Lungs:  clear to auscultation bilaterally, no wheezing, rhonchi or rales  Abd: soft, nontender, no hepatomegaly  Ext:  no edema Musculoskeletal:  No deformities, BUE and BLE strength normal and equal Skin: warm and dry  Neuro:  CNs 2-12 intact, no focal abnormalities noted Psych:  Normal affect     Laboratory Data: High Sensitivity Troponin:   Recent Labs  Lab 08/31/23 1618  TROPONINIHS 12      Chemistry Recent Labs  Lab 08/31/23 1618  NA 138  K 4.2  CL 105  CO2 23  GLUCOSE 141*  BUN 19  CREATININE 1.01*  CALCIUM  9.5  MG 2.4  GFRNONAA >60  ANIONGAP 10    Recent Labs  Lab 08/31/23 1618  PROT 6.4*  ALBUMIN 3.7  AST 21  ALT 18  ALKPHOS 61  BILITOT 0.3   Lipids No results for input(s): "CHOL", "TRIG", "HDL", "LABVLDL", "LDLCALC", "CHOLHDL" in the last 168 hours. Hematology Recent Labs  Lab 08/31/23 1618  WBC 6.7  RBC 3.81*  HGB 12.2  HCT 37.0  MCV 97.1  MCH 32.0  MCHC 33.0  RDW 12.4  PLT 231   Thyroid  No results for input(s): "TSH", "FREET4" in the last 168 hours. BNPNo results for input(s): "BNP", "PROBNP" in the last 168 hours.  DDimer No results for input(s): "DDIMER" in the last 168 hours.  Radiology/Studies:  No results found.   Assessment and Plan: 1.PVCs/NSVT/VT -Long history of PVCs and NSVT.  Episode of sustained VT noted 05/14/2023, lasting 4 minutes. She did not recall if she was symptomatic in following clinic evaluation. Seen by Dr  Alvis Ba 07/2023 with plans for echo and cMRI, as well as EP referral.  - presents today with recurrent episode of VT noted on device check, 1 min 42 seconds that was symptomatic  - normal K and Mg. EKG in ER AV paced - plan for echo, follow results likely inpatient cMRI - ask for EP to see while inpatient - if significnat recurrences during admission would start IV amiodarone. For now increase toprol  to 75mg  daily.    2. Afib - continue beta blocker, anticoag   3. CHB/SSS - has pacemaker      For questions or updates, please contact [facility] Please consult [email or web address] for contact info under     Signed, Armida Lander, MD  08/31/2023 6:43 PM

## 2023-08-31 NOTE — Telephone Encounter (Signed)
 Spoke with patient, had an episode of increased heart rate while simply sitting at a table, only lasted 1-2 minutes but rates up to 199 during episode. Patient denies any other symptoms during this episode (CP/SOB). Patient to send in transmission for our device team to review.

## 2023-09-01 ENCOUNTER — Inpatient Hospital Stay (HOSPITAL_COMMUNITY)

## 2023-09-01 DIAGNOSIS — I4729 Other ventricular tachycardia: Secondary | ICD-10-CM | POA: Diagnosis not present

## 2023-09-01 DIAGNOSIS — I4891 Unspecified atrial fibrillation: Secondary | ICD-10-CM | POA: Diagnosis not present

## 2023-09-01 DIAGNOSIS — I472 Ventricular tachycardia, unspecified: Secondary | ICD-10-CM | POA: Diagnosis not present

## 2023-09-01 LAB — HEPARIN LEVEL (UNFRACTIONATED): Heparin Unfractionated: >1.1 [IU]/mL — ABNORMAL HIGH (ref 0.30–0.70)

## 2023-09-01 LAB — BASIC METABOLIC PANEL WITH GFR
Anion gap: 8 (ref 5–15)
BUN: 14 mg/dL (ref 8–23)
CO2: 29 mmol/L (ref 22–32)
Calcium: 9.2 mg/dL (ref 8.9–10.3)
Chloride: 106 mmol/L (ref 98–111)
Creatinine, Ser: 0.92 mg/dL (ref 0.44–1.00)
GFR, Estimated: >60 mL/min (ref >60)
Glucose, Bld: 101 mg/dL — ABNORMAL HIGH (ref 70–99)
Potassium: 4.2 mmol/L (ref 3.5–5.1)
Sodium: 143 mmol/L (ref 135–145)

## 2023-09-01 LAB — APTT: aPTT: 32 s (ref 24–36)

## 2023-09-01 LAB — ECHOCARDIOGRAM COMPLETE
AR max vel: 1.76 cm2
AV Area VTI: 1.79 cm2
AV Area mean vel: 1.74 cm2
AV Mean grad: 3.1 mmHg
AV Peak grad: 5.7 mmHg
Ao pk vel: 1.19 m/s
Area-P 1/2: 5.13 cm2
Height: 67 in
S' Lateral: 3 cm
Single Plane A4C EF: 40.1 %
Weight: 2241.6 [oz_av]

## 2023-09-01 LAB — CBC
HCT: 40.4 % (ref 36.0–46.0)
Hemoglobin: 13.2 g/dL (ref 12.0–15.0)
MCH: 31.8 pg (ref 26.0–34.0)
MCHC: 32.7 g/dL (ref 30.0–36.0)
MCV: 97.3 fL (ref 80.0–100.0)
Platelets: 214 10*3/uL (ref 150–400)
RBC: 4.15 MIL/uL (ref 3.87–5.11)
RDW: 12.5 % (ref 11.5–15.5)
WBC: 5.7 10*3/uL (ref 4.0–10.5)
nRBC: 0 % (ref 0.0–0.2)

## 2023-09-01 LAB — TSH: TSH: 3.017 u[IU]/mL (ref 0.350–4.500)

## 2023-09-01 MED ORDER — HEPARIN (PORCINE) 25000 UT/250ML-% IV SOLN
1150.0000 [IU]/h | INTRAVENOUS | Status: DC
  Administered 2023-09-01: 900 [IU]/h via INTRAVENOUS
  Administered 2023-09-02: 1000 [IU]/h via INTRAVENOUS
  Filled 2023-09-01 (×2): qty 250

## 2023-09-01 NOTE — Plan of Care (Signed)
   Problem: Education: Goal: Knowledge of General Education information will improve Description Including pain rating scale, medication(s)/side effects and non-pharmacologic comfort measures Outcome: Progressing

## 2023-09-01 NOTE — Progress Notes (Signed)
 PHARMACY - ANTICOAGULATION CONSULT NOTE  Pharmacy Consult for heparin Indication: atrial fibrillation  Allergies  Allergen Reactions   Erythromycin Nausea Only and Dermatitis   Sulfa  Antibiotics Dermatitis, Rash and Other (See Comments)    Urticarial rash developed 1 day after completing a 14 day course of TMP/SMX.   Sulfur Rash and Other (See Comments)    Unknown    Patient Measurements: Height: 5\' 7"  (170.2 cm) Weight: 63.5 kg (140 lb 1.6 oz) IBW/kg (Calculated) : 61.6 HEPARIN DW (KG): 63.5  Vital Signs: Temp: 98.2 F (36.8 C) (05/31 0834) Temp Source: Oral (05/31 0834) BP: 134/48 (05/31 0834) Pulse Rate: 69 (05/31 0834)  Labs: Recent Labs    08/31/23 1618 08/31/23 1810 09/01/23 0438  HGB 12.2  --  13.2  HCT 37.0  --  40.4  PLT 231  --  214  CREATININE 1.01*  --  0.92  TROPONINIHS 12 12  --     Estimated Creatinine Clearance: 56.1 mL/min (by C-G formula based on SCr of 0.92 mg/dL).   Medical History: Past Medical History:  Diagnosis Date   Allergic rhinitis    Asthmatic bronchitis    Complete heart block (HCC)    GERD (gastroesophageal reflux disease)    MRSA (methicillin resistant Staphylococcus aureus) 2013   left buttock   Nonsustained ventricular tachycardia (HCC)    Presence of permanent cardiac pacemaker 08-05-08   Medtronic Adapta   Vaginitis    Vocal cord paresis     Medications:  Scheduled:   atorvastatin   20 mg Oral Daily   metoprolol  succinate  75 mg Oral Daily   sertraline   50 mg Oral Daily   Infusions:   Assessment: 26 YOF who presented after having an episode of VT that lasted 1 min 42 seconds, during which she was symptomatic. She has a history of PPM and AF, is on Xarelto  at home. Hgb stable 13.2, PLT 214. Pharmacy has been consulted to initiate heparin while further work-up and possible PPM change is pending.   Last dose of Xarelto  taken on 5/30 at 2215.  Will obtain baseline HL and aPTT prior to starting heparin infusion.Will  plan to initiate heparin approximately 24 hours after last Xarelto  dose at an initial rate of 900 units/hr and will forego bolus due to recent Xarelto  use.   Goal of Therapy:  Heparin level 0.3-0.7 units/ml aPTT 66-102 seconds Monitor platelets by anticoagulation protocol: Yes   Plan:  Initiate heparin infusion at 900 units/hr at 2200 on 5/31 PM Obtain baseline heparin level and aPTT Obtain aPTT 8 hours after initiation of heparin infusion Obtain daily heparin level, aPTT, and CBC Follow-up plans for procedures  Juleen Oakland, PharmD PGY1 Pharmacy Resident 09/01/2023 10:23 AM

## 2023-09-01 NOTE — Progress Notes (Signed)
  Echocardiogram 2D Echocardiogram has been performed.  Brandy [name] 09/01/2023, 4:08 PM
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
## 2023-09-01 NOTE — Plan of Care (Signed)
  Problem: Clinical Measurements: Goal: Diagnostic test results will improve Outcome: Progressing Goal: Respiratory complications will improve Outcome: Progressing Goal: Cardiovascular complication will be avoided Outcome: Progressing   Problem: Activity: Goal: Risk for activity intolerance will decrease Outcome: Progressing

## 2023-09-02 DIAGNOSIS — I472 Ventricular tachycardia, unspecified: Secondary | ICD-10-CM | POA: Diagnosis not present

## 2023-09-02 LAB — CBC
HCT: 41.5 % (ref 36.0–46.0)
Hemoglobin: 13.8 g/dL (ref 12.0–15.0)
MCH: 32.1 pg (ref 26.0–34.0)
MCHC: 33.3 g/dL (ref 30.0–36.0)
MCV: 96.5 fL (ref 80.0–100.0)
Platelets: 226 10*3/uL (ref 150–400)
RBC: 4.3 MIL/uL (ref 3.87–5.11)
RDW: 12.3 % (ref 11.5–15.5)
WBC: 6.5 10*3/uL (ref 4.0–10.5)
nRBC: 0 % (ref 0.0–0.2)

## 2023-09-02 LAB — APTT
aPTT: 56 s — ABNORMAL HIGH (ref 24–36)
aPTT: 74 s — ABNORMAL HIGH (ref 24–36)
aPTT: 71 s — ABNORMAL HIGH (ref 24–36)

## 2023-09-02 LAB — HEPARIN LEVEL (UNFRACTIONATED): Heparin Unfractionated: 0.56 [IU]/mL (ref 0.30–0.70)

## 2023-09-02 MED ORDER — FLUTICASONE PROPIONATE 50 MCG/ACT NA SUSP
1.0000 | Freq: Every day | NASAL | Status: DC
  Administered 2023-09-02 – 2023-09-05 (×3): 1 via NASAL
  Filled 2023-09-02: qty 16

## 2023-09-02 NOTE — Plan of Care (Signed)

## 2023-09-02 NOTE — Progress Notes (Signed)
 PHARMACY - ANTICOAGULATION CONSULT NOTE  Pharmacy Consult for heparin Indication: atrial fibrillation  Allergies  Allergen Reactions   Erythromycin Nausea Only and Dermatitis   Sulfa  Antibiotics Dermatitis, Rash and Other (See Comments)    Urticarial rash developed 1 day after completing a 14 day course of TMP/SMX.   Sulfur Rash and Other (See Comments)    Unknown    Patient Measurements: Height: 5\' 7"  (170.2 cm) Weight: 63.5 kg (139 lb 14.4 oz) IBW/kg (Calculated) : 61.6 HEPARIN DW (KG): 63.5  Vital Signs: Temp: 97.9 F (36.6 C) (06/01 1235) Temp Source: Oral (06/01 1235) BP: 137/79 (06/01 1235) Pulse Rate: 64 (06/01 1235)  Labs: Recent Labs    08/31/23 1618 08/31/23 1810 09/01/23 0438 09/01/23 1045 09/02/23 0442 09/02/23 1354  HGB 12.2  --  13.2  --  13.8  --   HCT 37.0  --  40.4  --  41.5  --   PLT 231  --  214  --  226  --   APTT  --   --   --  32 56* 74*  HEPARINUNFRC  --   --   --  >1.10* 0.56  --   CREATININE 1.01*  --  0.92  --   --   --   TROPONINIHS 12 12  --   --   --   --     Estimated Creatinine Clearance: 56.1 mL/min (by C-G formula based on SCr of 0.92 mg/dL).   Medical History: Past Medical History:  Diagnosis Date   Allergic rhinitis    Asthmatic bronchitis    Complete heart block (HCC)    GERD (gastroesophageal reflux disease)    MRSA (methicillin resistant Staphylococcus aureus) 2013   left buttock   Nonsustained ventricular tachycardia (HCC)    Presence of permanent cardiac pacemaker 08-05-08   Medtronic Adapta   Vaginitis    Vocal cord paresis     Medications:  Scheduled:   atorvastatin   20 mg Oral Daily   fluticasone   1 spray Each Nare Daily   metoprolol  succinate  75 mg Oral Daily   sertraline   50 mg Oral Daily   Infusions:   heparin 1,000 Units/hr (09/02/23 0606)    Assessment: 67 YOF who presented after having an episode of VT that lasted 1 min 42 seconds, during which she was symptomatic. She has a history of PPM and  AF, is on Xarelto  at home. Hgb stable 13.2, PLT 214. Pharmacy has been consulted to initiate heparin while further work-up and possible PPM change is pending.   6/1: aPTT therapeutic at 74 seconds while on 1000 units/hr. CBC stable. No issues with infusion and no new bleeding noted per RN.  Goal of Therapy:  Heparin level 0.3-0.7 units/ml aPTT 66-102 seconds Monitor platelets by anticoagulation protocol: Yes   Plan:  Continue heparin infusion at 1000 units/hr Obtain confirmatory aPTT in 8 hours Obtain daily heparin level, aPTT, and CBC Follow-up plans for ICD placement  Juleen Oakland, PharmD PGY1 Pharmacy Resident 09/02/2023 2:20 PM

## 2023-09-02 NOTE — Progress Notes (Signed)
   Rounding Note    Patient Name: Brandy [name] Date of Encounter: 09/02/2023  [facility] Cardiologist: Luana Rumple, MD   Subjective   NAEO. In chair eating breakfast this AM.   Vital Signs    Vitals:   09/01/23 1946 09/01/23 2334 09/02/23 0436 09/02/23 0814  BP: (!) 148/66 127/66 139/75 137/66  Pulse: 78 69 70 70  Resp: 20 18 18 18   Temp: 97.6 F (36.4 C) 97.7 F (36.5 C) 98.5 F (36.9 C) 98.2 F (36.8 C)  TempSrc: Oral Oral Oral Oral  SpO2: 100% 96% 94% 99%  Weight:   63.5 kg   Height:        Intake/Output Summary (Last 24 hours) at 09/02/2023 0840 Last data filed at 09/02/2023 0606 Gross per 24 hour  Intake 304.04 ml  Output --  Net 304.04 ml      09/02/2023    4:36 AM 09/01/2023    5:55 AM 08/31/2023    8:38 PM  Last 3 Weights  Weight (lbs) 139 lb 14.4 oz 140 lb 1.6 oz 143 lb 12.8 oz  Weight (kg) 63.458 kg 63.549 kg 65.227 kg      Telemetry    No sustained ventricular arrhythmias.  Single and couplet PVCs- Personally Reviewed  ECG    Personally Reviewed  Physical Exam   GEN: No acute distress.   Cardiac: RRR, no murmurs, rubs, or gallops.  Respiratory: Clear to auscultation bilaterally. Psych: Normal affect   Assessment & Plan    #Sustained VT Monomorphic by device interrogation. Unclear etiology. EF normal this admission  Needs cardiac MRI (I have ordered this) ? Need for FDG-PET pending results of above. Will need ICD. Will require extraction and upgrade. Timing TBD based on above workup. Anticipate this admission. Continue metoprolol .   #AF Continue heparin      Donelda Fujita T. Marven Slimmer, MD, [facility], [facility] Cardiac Electrophysiology

## 2023-09-02 NOTE — Progress Notes (Signed)
 PHARMACY - ANTICOAGULATION Pharmacy Consult for heparin Indication: atrial fibrillation Brief A/P: aPTT within goal range Continue Heparin at current rate    Allergies  Allergen Reactions   Erythromycin Nausea Only and Dermatitis   Sulfa  Antibiotics Dermatitis, Rash and Other (See Comments)    Urticarial rash developed 1 day after completing a 14 day course of TMP/SMX.   Sulfur Rash and Other (See Comments)    Unknown    Patient Measurements: Height: 5\' 7"  (170.2 cm) Weight: 63.5 kg (139 lb 14.4 oz) IBW/kg (Calculated) : 61.6 HEPARIN DW (KG): 63.5  Vital Signs: Temp: 97.8 F (36.6 C) (06/01 1932) Temp Source: Oral (06/01 1932) BP: 138/55 (06/01 1932) Pulse Rate: 71 (06/01 1932)  Labs: Recent Labs    08/31/23 1618 08/31/23 1810 09/01/23 0438 09/01/23 1045 09/01/23 1045 09/02/23 0442 09/02/23 1354 09/02/23 2156  HGB 12.2  --  13.2  --   --  13.8  --   --   HCT 37.0  --  40.4  --   --  41.5  --   --   PLT 231  --  214  --   --  226  --   --   APTT  --   --   --  32   < > 56* 74* 71*  HEPARINUNFRC  --   --   --  >1.10*  --  0.56  --   --   CREATININE 1.01*  --  0.92  --   --   --   --   --   TROPONINIHS 12 12  --   --   --   --   --   --    < > = values in this interval not displayed.    Estimated Creatinine Clearance: 56.1 mL/min (by C-G formula based on SCr of 0.92 mg/dL).  Assessment: 68 y.o. female with h/o Afib, Xarelto  on hold, for heparin  Goal of Therapy:  Heparin level 0.3-0.7 units/ml aPTT 66-102 seconds Monitor platelets by anticoagulation protocol: Yes   Plan:  No change to heparin    Claudine Cullens, PharmD, BCPS  09/02/2023 11:46 PM

## 2023-09-02 NOTE — Progress Notes (Signed)
 PHARMACY - ANTICOAGULATION Pharmacy Consult for heparin Indication: atrial fibrillation Brief A/P: aPTT subtherapeutic Increase Heparin rate  Allergies  Allergen Reactions   Erythromycin Nausea Only and Dermatitis   Sulfa  Antibiotics Dermatitis, Rash and Other (See Comments)    Urticarial rash developed 1 day after completing a 14 day course of TMP/SMX.   Sulfur Rash and Other (See Comments)    Unknown    Patient Measurements: Height: 5\' 7"  (170.2 cm) Weight: 63.5 kg (139 lb 14.4 oz) IBW/kg (Calculated) : 61.6 HEPARIN DW (KG): 63.5  Vital Signs: Temp: 98.5 F (36.9 C) (06/01 0436) Temp Source: Oral (06/01 0436) BP: 139/75 (06/01 0436) Pulse Rate: 70 (06/01 0436)  Labs: Recent Labs    08/31/23 1618 08/31/23 1810 09/01/23 0438 09/01/23 1045 09/02/23 0442  HGB 12.2  --  13.2  --  13.8  HCT 37.0  --  40.4  --  41.5  PLT 231  --  214  --  226  APTT  --   --   --  32 56*  HEPARINUNFRC  --   --   --  >1.10* 0.56  CREATININE 1.01*  --  0.92  --   --   TROPONINIHS 12 12  --   --   --     Estimated Creatinine Clearance: 56.1 mL/min (by C-G formula based on SCr of 0.92 mg/dL).  Assessment: 68 y.o. female with h/o Afib, Xarelto  on hold, for heparin  Goal of Therapy:  Heparin level 0.3-0.7 units/ml aPTT 66-102 seconds Monitor platelets by anticoagulation protocol: Yes   Plan:  Increase Heparin 1000 units/hr Check aPTT in 8 hours  Claudine Cullens, PharmD, BCPS  09/02/2023 6:01 AM

## 2023-09-03 ENCOUNTER — Inpatient Hospital Stay (HOSPITAL_COMMUNITY)

## 2023-09-03 ENCOUNTER — Encounter (HOSPITAL_COMMUNITY): Payer: Self-pay

## 2023-09-03 ENCOUNTER — Ambulatory Visit (HOSPITAL_COMMUNITY): Attending: Cardiovascular Disease

## 2023-09-03 DIAGNOSIS — I480 Paroxysmal atrial fibrillation: Secondary | ICD-10-CM | POA: Diagnosis not present

## 2023-09-03 DIAGNOSIS — I472 Ventricular tachycardia, unspecified: Secondary | ICD-10-CM

## 2023-09-03 LAB — CBC
HCT: 39.5 % (ref 36.0–46.0)
Hemoglobin: 12.8 g/dL (ref 12.0–15.0)
MCH: 31.7 pg (ref 26.0–34.0)
MCHC: 32.4 g/dL (ref 30.0–36.0)
MCV: 97.8 fL (ref 80.0–100.0)
Platelets: 215 10*3/uL (ref 150–400)
RBC: 4.04 MIL/uL (ref 3.87–5.11)
RDW: 12.2 % (ref 11.5–15.5)
WBC: 5.6 10*3/uL (ref 4.0–10.5)
nRBC: 0 % (ref 0.0–0.2)

## 2023-09-03 LAB — APTT
aPTT: 40 s — ABNORMAL HIGH (ref 24–36)
aPTT: 138 s — ABNORMAL HIGH (ref 24–36)

## 2023-09-03 LAB — HEPARIN LEVEL (UNFRACTIONATED)
Heparin Unfractionated: 0.2 [IU]/mL — ABNORMAL LOW (ref 0.30–0.70)
Heparin Unfractionated: 0.98 [IU]/mL — ABNORMAL HIGH (ref 0.30–0.70)

## 2023-09-03 MED ORDER — SODIUM CHLORIDE 0.9 % WEIGHT BASED INFUSION
3.0000 mL/kg/h | INTRAVENOUS | Status: AC
  Administered 2023-09-04: 3 mL/kg/h via INTRAVENOUS

## 2023-09-03 MED ORDER — SODIUM CHLORIDE 0.9 % WEIGHT BASED INFUSION
1.0000 mL/kg/h | INTRAVENOUS | Status: DC
  Administered 2023-09-04: 1 mL/kg/h via INTRAVENOUS

## 2023-09-03 MED ORDER — ASPIRIN 81 MG PO CHEW
81.0000 mg | CHEWABLE_TABLET | ORAL | Status: AC
  Administered 2023-09-04: 81 mg via ORAL
  Filled 2023-09-03: qty 1

## 2023-09-03 MED ORDER — GADOBUTROL 1 MMOL/ML IV SOLN
9.0000 mL | Freq: Once | INTRAVENOUS | Status: AC | PRN
  Administered 2023-09-03: 9 mL via INTRAVENOUS

## 2023-09-03 MED ORDER — HEPARIN (PORCINE) 25000 UT/250ML-% IV SOLN
1000.0000 [IU]/h | INTRAVENOUS | Status: DC
  Administered 2023-09-03 – 2023-09-04 (×2): 1000 [IU]/h via INTRAVENOUS
  Filled 2023-09-03: qty 250

## 2023-09-03 NOTE — Progress Notes (Signed)
 Patient in MRI for scan, patient Heparin switched over to MRI tubing continued at previous rate.  Patient placed on MRI monitor, will continue to monitor patient while in MRI.

## 2023-09-03 NOTE — CV Procedure (Signed)
  Device system confirmed to be MRI conditional, with implant date > 6 weeks ago, and no evidence of abandoned or epicardial leads in review of most recent CXR  Device last cleared by EP Provider: Kay Parson on 09/03/23  Clearance is good through for 1 year as long as parameters remain stable at time of check. If pt undergoes a cardiac device procedure during that time, they should be re-cleared.   Tachy-therapies to be programmed off if applicable with device back to pre-MRI settings after completion of exam.  [employer] - Programming recommendation received through [employer] App/Tablet  Buster Cash, RT  09/03/2023 4:02 PM

## 2023-09-03 NOTE — Plan of Care (Signed)
  Problem: Education: Goal: Knowledge of General Education information will improve Description: Including pain rating scale, medication(s)/side effects and non-pharmacologic comfort measures 09/03/2023 0535 by Dominique Frieze, RN Outcome: Progressing 09/03/2023 0535 by Dominique Frieze, RN Outcome: Progressing   Problem: Health Behavior/Discharge Planning: Goal: Ability to manage health-related needs will improve 09/03/2023 0535 by Dominique Frieze, RN Outcome: Progressing 09/03/2023 0535 by Dominique Frieze, RN Outcome: Progressing   Problem: Clinical Measurements: Goal: Ability to maintain clinical measurements within normal limits will improve 09/03/2023 0535 by Dominique Frieze, RN Outcome: Progressing 09/03/2023 0535 by Dominique Frieze, RN Outcome: Progressing Goal: Will remain free from infection 09/03/2023 0535 by Dominique Frieze, RN Outcome: Progressing 09/03/2023 0535 by Dominique Frieze, RN Outcome: Progressing Goal: Diagnostic test results will improve 09/03/2023 0535 by Dominique Frieze, RN Outcome: Progressing 09/03/2023 0535 by Dominique Frieze, RN Outcome: Progressing Goal: Respiratory complications will improve 09/03/2023 0535 by Dominique Frieze, RN Outcome: Progressing 09/03/2023 0535 by Dominique Frieze, RN Outcome: Progressing Goal: Cardiovascular complication will be avoided 09/03/2023 0535 by Dominique Frieze, RN Outcome: Progressing 09/03/2023 0535 by Dominique Frieze, RN Outcome: Progressing   Problem: Activity: Goal: Risk for activity intolerance will decrease 09/03/2023 0535 by Dominique Frieze, RN Outcome: Progressing 09/03/2023 0535 by Dominique Frieze, RN Outcome: Progressing   Problem: Nutrition: Goal: Adequate nutrition will be maintained 09/03/2023 0535 by Dominique Frieze, RN Outcome: Progressing 09/03/2023 0535 by Dominique Frieze, RN Outcome: Progressing   Problem: Coping: Goal: Level of anxiety will decrease 09/03/2023  0535 by Dominique Frieze, RN Outcome: Progressing 09/03/2023 0535 by Dominique Frieze, RN Outcome: Progressing   Problem: Elimination: Goal: Will not experience complications related to bowel motility 09/03/2023 0535 by Dominique Frieze, RN Outcome: Progressing 09/03/2023 0535 by Dominique Frieze, RN Outcome: Progressing Goal: Will not experience complications related to urinary retention 09/03/2023 0535 by Dominique Frieze, RN Outcome: Progressing 09/03/2023 0535 by Dominique Frieze, RN Outcome: Progressing   Problem: Pain Managment: Goal: General experience of comfort will improve and/or be controlled 09/03/2023 0535 by Dominique Frieze, RN Outcome: Progressing 09/03/2023 0535 by Dominique Frieze, RN Outcome: Progressing   Problem: Safety: Goal: Ability to remain free from injury will improve 09/03/2023 0535 by Dominique Frieze, RN Outcome: Progressing 09/03/2023 0535 by Dominique Frieze, RN Outcome: Progressing   Problem: Skin Integrity: Goal: Risk for impaired skin integrity will decrease 09/03/2023 0535 by Dominique Frieze, RN Outcome: Progressing 09/03/2023 0535 by Dominique Frieze, RN Outcome: Progressing   Problem: Education: Goal: Knowledge of disease or condition will improve 09/03/2023 0535 by Dominique Frieze, RN Outcome: Progressing 09/03/2023 0535 by Dominique Frieze, RN Outcome: Progressing Goal: Understanding of medication regimen will improve 09/03/2023 0535 by Dominique Frieze, RN Outcome: Progressing 09/03/2023 0535 by Dominique Frieze, RN Outcome: Progressing Goal: Individualized Educational Video(s) Outcome: Progressing   Problem: Activity: Goal: Ability to tolerate increased activity will improve Outcome: Progressing   Problem: Cardiac: Goal: Ability to achieve and maintain adequate cardiopulmonary perfusion will improve Outcome: Progressing   Problem: Health Behavior/Discharge Planning: Goal: Ability to safely manage health-related  needs after discharge will improve Outcome: Progressing

## 2023-09-03 NOTE — Progress Notes (Addendum)
 Rounding Note   Patient Name: Brandy [name] Date of Encounter: 09/03/2023  [facility] Cardiologist: Luana Rumple, MD   Subjective Feels well.  Scheduled Meds:  atorvastatin   20 mg Oral Daily   fluticasone   1 spray Each Nare Daily   metoprolol  succinate  75 mg Oral Daily   sertraline   50 mg Oral Daily   Continuous Infusions:  heparin 1,000 Units/hr (09/03/23 0321)   PRN Meds: acetaminophen , ALPRAZolam , ondansetron  (ZOFRAN ) IV   Vital Signs  Vitals:   09/02/23 1616 09/02/23 1932 09/03/23 0034 09/03/23 0429  BP: (!) 123/54 (!) 138/55 128/84 (!) 148/65  Pulse: 71 71 70 70  Resp: 16 20 18 18   Temp: 98.1 F (36.7 C) 97.8 F (36.6 C) 97.9 F (36.6 C) 98.2 F (36.8 C)  TempSrc: Oral Oral Oral Oral  SpO2: 100% 99%  100%  Weight:    63.7 kg  Height:        Intake/Output Summary (Last 24 hours) at 09/03/2023 0739 Last data filed at 09/03/2023 0321 Gross per 24 hour  Intake 902.17 ml  Output --  Net 902.17 ml      09/03/2023    4:29 AM 09/02/2023    4:36 AM 09/01/2023    5:55 AM  Last 3 Weights  Weight (lbs) 140 lb 8 oz 139 lb 14.4 oz 140 lb 1.6 oz  Weight (kg) 63.73 kg 63.458 kg 63.549 kg      Telemetry AV paced, couplet  and one NSVT 6beats - Personally Reviewed  ECG  No new EKGs - Personally Reviewed  Physical Exam  GEN: No acute distress.   Neck: No JVD Cardiac: RRR, no murmurs, rubs, or gallops.  Respiratory: Clear to auscultation bilaterally. GI: Soft, nontender, non-distended  MS: No edema; No deformity. Neuro:  Nonfocal  Psych: Normal affect   Labs High Sensitivity Troponin:   Recent Labs  Lab 08/31/23 1618 08/31/23 1810  TROPONINIHS 12 12     Chemistry Recent Labs  Lab 08/31/23 1618 09/01/23 0438  NA 138 143  K 4.2 4.2  CL 105 106  CO2 23 29  GLUCOSE 141* 101*  BUN 19 14  CREATININE 1.01* 0.92  CALCIUM  9.5 9.2  MG 2.4  --   PROT 6.4*  --   ALBUMIN 3.7  --   AST 21  --   ALT 18  --   ALKPHOS 61  --   BILITOT 0.3   --   GFRNONAA >60 >60  ANIONGAP 10 8    Lipids No results for input(s): "CHOL", "TRIG", "HDL", "LABVLDL", "LDLCALC", "CHOLHDL" in the last 168 hours.  Hematology Recent Labs  Lab 09/01/23 0438 09/02/23 0442 09/03/23 0512  WBC 5.7 6.5 5.6  RBC 4.15 4.30 4.04  HGB 13.2 13.8 12.8  HCT 40.4 41.5 39.5  MCV 97.3 96.5 97.8  MCH 31.8 32.1 31.7  MCHC 32.7 33.3 32.4  RDW 12.5 12.3 12.2  PLT 214 226 215   Thyroid   Recent Labs  Lab 09/01/23 0438  TSH 3.017    BNPNo results for input(s): "BNP", "PROBNP" in the last 168 hours.  DDimer No results for input(s): "DDIMER" in the last 168 hours.   Radiology    Cardiac Studies   09/01/23: TTE 1. Left ventricular ejection fraction, by estimation, is 55 to 60%. The  left ventricle has normal function. The left ventricle has no regional  wall motion abnormalities. Left ventricular diastolic parameters are  indeterminate. Elevated left atrial  pressure.   2. Right  ventricular systolic function is normal. The right ventricular  size is normal. There is normal pulmonary artery systolic pressure.   3. The mitral valve is normal in structure. Trivial mitral valve  regurgitation. No evidence of mitral stenosis.   4. The tricuspid valve is abnormal.   5. The aortic valve is tricuspid. Aortic valve regurgitation is not  visualized. No aortic stenosis is present.   6. The inferior vena cava is normal in size with greater than 50%  respiratory variability, suggesting right atrial pressure of 3 mmHg.     10/03/2017: Coronary CT IMPRESSION: 1. Coronary calcium  score of 0. This was 0 percentile for age and sex matched control.   2. Normal coronary origin with right dominance.   3. Minimal non-obstructive plaque in the proximal LCX and mid LAD arteries.  Patient Profile   68 y.o. female w/PMHx of  HLD CHB w/PPM PVCs AFib  NSVTs over the years >> sustained VT of late noted on her PPM checks out patient  Admitted with recurrent  symptomatic sustained VT, lightheaded, dizzy, no syncope   Assessment & Plan   VT  Out patient w/u had been planned > c.MRI, possibly a cardiac stress MRI, perhaps PET with some suspicion of perhaps sarcoid given both she/her sister required pacing young (and fairly low suspicion of obstructive CAD give CT in 2019)  C.MRI is ordered/pending She had coronary CT 2019 with no calcium /CAD >> reviewed w/Dr. 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 Koob and she is willing to proceed.      For questions or updates, please contact [facility] Please consult [email or web address] for contact info under     Signed, Debbie Fails, PA-C  09/03/2023, 7:39 AM

## 2023-09-03 NOTE — Progress Notes (Signed)
 Mobility Specialist Progress Note;   09/03/23 1121  Mobility  Activity Ambulated independently in hallway  Level of Assistance Standby assist, set-up cues, supervision of patient - no hands on  Assistive Device Other (Comment) (IV pole)  Distance Ambulated (ft) 400 ft  Activity Response Tolerated well  Mobility Referral Yes  Mobility visit 1 Mobility  Mobility Specialist Start Time (ACUTE ONLY) 1121  Mobility Specialist Stop Time (ACUTE ONLY) 1127  Mobility Specialist Time Calculation (min) (ACUTE ONLY) 6 min   Pt eager for mobility. Required no physical assistance during ambulation, SV. VSS throughout and no c/o when asked. Pt returned back to bed with all needs met. Daughter in room.   Janit Meline Mobility Specialist Please contact via SecureChat or [employer] [phone]

## 2023-09-03 NOTE — Progress Notes (Signed)
 MRI completed, patient tolerated without any difficulty.  Patient placed back on telemetry, waiting on transport to return to room.

## 2023-09-03 NOTE — H&P (View-Only) (Signed)
 Rounding Note   Patient Name: Brandy [name] Date of Encounter: 09/03/2023  [facility] Cardiologist: Luana Rumple, MD   Subjective Feels well.  Scheduled Meds:  atorvastatin   20 mg Oral Daily   fluticasone   1 spray Each Nare Daily   metoprolol  succinate  75 mg Oral Daily   sertraline   50 mg Oral Daily   Continuous Infusions:  heparin 1,000 Units/hr (09/03/23 0321)   PRN Meds: acetaminophen , ALPRAZolam , ondansetron  (ZOFRAN ) IV   Vital Signs  Vitals:   09/02/23 1616 09/02/23 1932 09/03/23 0034 09/03/23 0429  BP: (!) 123/54 (!) 138/55 128/84 (!) 148/65  Pulse: 71 71 70 70  Resp: 16 20 18 18   Temp: 98.1 F (36.7 C) 97.8 F (36.6 C) 97.9 F (36.6 C) 98.2 F (36.8 C)  TempSrc: Oral Oral Oral Oral  SpO2: 100% 99%  100%  Weight:    63.7 kg  Height:        Intake/Output Summary (Last 24 hours) at 09/03/2023 0739 Last data filed at 09/03/2023 0321 Gross per 24 hour  Intake 902.17 ml  Output --  Net 902.17 ml      09/03/2023    4:29 AM 09/02/2023    4:36 AM 09/01/2023    5:55 AM  Last 3 Weights  Weight (lbs) 140 lb 8 oz 139 lb 14.4 oz 140 lb 1.6 oz  Weight (kg) 63.73 kg 63.458 kg 63.549 kg      Telemetry AV paced, couplet  and one NSVT 6beats - Personally Reviewed  ECG  No new EKGs - Personally Reviewed  Physical Exam  GEN: No acute distress.   Neck: No JVD Cardiac: RRR, no murmurs, rubs, or gallops.  Respiratory: Clear to auscultation bilaterally. GI: Soft, nontender, non-distended  MS: No edema; No deformity. Neuro:  Nonfocal  Psych: Normal affect   Labs High Sensitivity Troponin:   Recent Labs  Lab 08/31/23 1618 08/31/23 1810  TROPONINIHS 12 12     Chemistry Recent Labs  Lab 08/31/23 1618 09/01/23 0438  NA 138 143  K 4.2 4.2  CL 105 106  CO2 23 29  GLUCOSE 141* 101*  BUN 19 14  CREATININE 1.01* 0.92  CALCIUM  9.5 9.2  MG 2.4  --   PROT 6.4*  --   ALBUMIN 3.7  --   AST 21  --   ALT 18  --   ALKPHOS 61  --   BILITOT 0.3   --   GFRNONAA >60 >60  ANIONGAP 10 8    Lipids No results for input(s): "CHOL", "TRIG", "HDL", "LABVLDL", "LDLCALC", "CHOLHDL" in the last 168 hours.  Hematology Recent Labs  Lab 09/01/23 0438 09/02/23 0442 09/03/23 0512  WBC 5.7 6.5 5.6  RBC 4.15 4.30 4.04  HGB 13.2 13.8 12.8  HCT 40.4 41.5 39.5  MCV 97.3 96.5 97.8  MCH 31.8 32.1 31.7  MCHC 32.7 33.3 32.4  RDW 12.5 12.3 12.2  PLT 214 226 215   Thyroid   Recent Labs  Lab 09/01/23 0438  TSH 3.017    BNPNo results for input(s): "BNP", "PROBNP" in the last 168 hours.  DDimer No results for input(s): "DDIMER" in the last 168 hours.   Radiology    Cardiac Studies   09/01/23: TTE 1. Left ventricular ejection fraction, by estimation, is 55 to 60%. The  left ventricle has normal function. The left ventricle has no regional  wall motion abnormalities. Left ventricular diastolic parameters are  indeterminate. Elevated left atrial  pressure.   2. Right  ventricular systolic function is normal. The right ventricular  size is normal. There is normal pulmonary artery systolic pressure.   3. The mitral valve is normal in structure. Trivial mitral valve  regurgitation. No evidence of mitral stenosis.   4. The tricuspid valve is abnormal.   5. The aortic valve is tricuspid. Aortic valve regurgitation is not  visualized. No aortic stenosis is present.   6. The inferior vena cava is normal in size with greater than 50%  respiratory variability, suggesting right atrial pressure of 3 mmHg.     10/03/2017: Coronary CT IMPRESSION: 1. Coronary calcium  score of 0. This was 0 percentile for age and sex matched control.   2. Normal coronary origin with right dominance.   3. Minimal non-obstructive plaque in the proximal LCX and mid LAD arteries.  Patient Profile   68 y.o. female w/PMHx of  HLD CHB w/PPM PVCs AFib  NSVTs over the years >> sustained VT of late noted on her PPM checks out patient  Admitted with recurrent  symptomatic sustained VT, lightheaded, dizzy, no syncope   Assessment & Plan   VT  Out patient w/u had been planned > c.MRI, possibly a cardiac stress MRI, perhaps PET with some suspicion of perhaps sarcoid given both she/her sister required pacing young (and fairly low suspicion of obstructive CAD give CT in 2019)  C.MRI is ordered/pending She had coronary CT 2019 with no calcium /CAD >> reviewed w/Dr. 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 Koob and she is willing to proceed.      For questions or updates, please contact [facility] Please consult [email or web address] for contact info under     Signed, Debbie Fails, PA-C  09/03/2023, 7:39 AM

## 2023-09-03 NOTE — Progress Notes (Signed)
 PHARMACY - ANTICOAGULATION Pharmacy Consult for heparin Indication: atrial fibrillation   Allergies  Allergen Reactions   Erythromycin Nausea Only and Dermatitis   Sulfa  Antibiotics Dermatitis, Rash and Other (See Comments)    Urticarial rash developed 1 day after completing a 14 day course of TMP/SMX.   Sulfur Rash and Other (See Comments)    Unknown    Patient Measurements: Height: 5\' 7"  (170.2 cm) Weight: 63.7 kg (140 lb 8 oz) IBW/kg (Calculated) : 61.6 HEPARIN DW (KG): 63.5  Vital Signs: Temp: 97.6 F (36.4 C) (06/02 1614) Temp Source: Axillary (06/02 1614) BP: 146/79 (06/02 1614) Pulse Rate: 70 (06/02 1614)  Labs: Recent Labs    09/01/23 0438 09/01/23 1045 09/02/23 0442 09/02/23 1354 09/02/23 2156 09/03/23 0512 09/03/23 1557  HGB 13.2  --  13.8  --   --  12.8  --   HCT 40.4  --  41.5  --   --  39.5  --   PLT 214  --  226  --   --  215  --   APTT  --    < > 56*   < > 71* 40* 138*  HEPARINUNFRC  --    < > 0.56  --   --  0.20* 0.98*  CREATININE 0.92  --   --   --   --   --   --    < > = values in this interval not displayed.    Estimated Creatinine Clearance: 56.1 mL/min (by C-G formula based on SCr of 0.92 mg/dL).  Assessment: 68 y.o. female with h/o Afib, Xarelto  on hold (last dose 5/29), for heparin. Plan for heart cath tomorrow. Patient just back from cardiac MRI.   Levels elevated - aPTT prolonged at 138, heparin level 0.98 after increase to 1150 units/hr. Confirmed with patient and RN that levels were drawn from the right, opposite side from heparin which is infusing on the left. Patient is not having any bleeding. No anticoagulants given outside of heparin per MAR.    Goal of Therapy:  Heparin level 0.3-0.7 units/ml aPTT 66-102 seconds Monitor platelets by anticoagulation protocol: Yes   Plan:  -Hold heparin for 1 hour.  -Restart after 1 hour at lower rate of 1000 units/hr.  -Recheck level in 6-8 hours - ok w/ AM labs -F/up post Cath in  AM  Lenard Quam, PharmD, BCPS, BCCCP Please refer to [facility] for [facility] Pharmacy numbers 09/03/2023, 6:45 PM

## 2023-09-03 NOTE — Progress Notes (Signed)
 PHARMACY - ANTICOAGULATION Pharmacy Consult for heparin Indication: atrial fibrillation   Allergies  Allergen Reactions   Erythromycin Nausea Only and Dermatitis   Sulfa  Antibiotics Dermatitis, Rash and Other (See Comments)    Urticarial rash developed 1 day after completing a 14 day course of TMP/SMX.   Sulfur Rash and Other (See Comments)    Unknown    Patient Measurements: Height: 5\' 7"  (170.2 cm) Weight: 63.7 kg (140 lb 8 oz) IBW/kg (Calculated) : 61.6 HEPARIN DW (KG): 63.5  Vital Signs: Temp: 98.2 F (36.8 C) (06/02 0429) Temp Source: Oral (06/02 0429) BP: 148/65 (06/02 0429) Pulse Rate: 70 (06/02 0429)  Labs: Recent Labs    08/31/23 1618 08/31/23 1810 09/01/23 0438 09/01/23 0438 09/01/23 1045 09/02/23 0442 09/02/23 1354 09/02/23 2156 09/03/23 0512  HGB 12.2  --  13.2  --   --  13.8  --   --  12.8  HCT 37.0  --  40.4  --   --  41.5  --   --  39.5  PLT 231  --  214  --   --  226  --   --  215  APTT  --   --   --    < > 32 56* 74* 71* 40*  HEPARINUNFRC  --   --   --   --  >1.10* 0.56  --   --  0.20*  CREATININE 1.01*  --  0.92  --   --   --   --   --   --   TROPONINIHS 12 12  --   --   --   --   --   --   --    < > = values in this interval not displayed.    Estimated Creatinine Clearance: 56.1 mL/min (by C-G formula based on SCr of 0.92 mg/dL).  Assessment: 68 y.o. female with h/o Afib, Xarelto  on hold (last dose 5/29), for heparin.   -aPTT= 40 on heparin 1000 units/hr -plans noted for ICD  Goal of Therapy:  Heparin level 0.3-0.7 units/ml aPTT 66-102 seconds Monitor platelets by anticoagulation protocol: Yes   Plan:  -Increase heparin to 1150 units/hr -aPTT in 8 hrs   Baxter Limber, PharmD Clinical Pharmacist **Pharmacist phone directory can now be found on [email or web address] ([email or web address]).  Listed under [facility].

## 2023-09-03 NOTE — TOC CM/SW Note (Signed)
 Transition of Care [facility]) - Inpatient Brief Assessment   Patient Details  Name: SYDNI ELIZARRARAZ MRN: [id number] Date of Birth: Jul 13, 1955  [facility] CM/SW Contact:    Juliane Och, LCSW Phone Number: 09/03/2023, 10:23 AM   Clinical Narrative:  10:23 AM Per chart review, patient resides at home. Patient has a PCP and insurance. Patient does not have SNF/HH/DME history. No TOC needs were identified at this time. TOC will continue to follow and be available to assist.  Transition of Care Asessment: Insurance and Status: Insurance coverage has been reviewed Patient has primary care physician: Yes Home environment has been reviewed: Private Residence Prior level of function:: N/A Prior/Current Home Services: No current home services Social Drivers of Health Review: SDOH reviewed no interventions necessary Readmission risk has been reviewed: Yes Transition of care needs: no transition of care needs at this time

## 2023-09-04 ENCOUNTER — Inpatient Hospital Stay (HOSPITAL_COMMUNITY)
Admission: RE | Disposition: A | Discharge status: Home / Self Care | Payer: Self-pay | Source: Home / Self Care | Attending: Cardiology

## 2023-09-04 ENCOUNTER — Encounter (HOSPITAL_COMMUNITY): Payer: Self-pay | Admitting: Cardiology

## 2023-09-04 DIAGNOSIS — I4891 Unspecified atrial fibrillation: Secondary | ICD-10-CM | POA: Diagnosis not present

## 2023-09-04 DIAGNOSIS — I472 Ventricular tachycardia, unspecified: Secondary | ICD-10-CM | POA: Diagnosis not present

## 2023-09-04 DIAGNOSIS — Z950 Presence of cardiac pacemaker: Secondary | ICD-10-CM | POA: Diagnosis not present

## 2023-09-04 LAB — CBC
HCT: 39.6 % (ref 36.0–46.0)
HCT: 36.4 % (ref 36.0–46.0)
Hemoglobin: 12.9 g/dL (ref 12.0–15.0)
Hemoglobin: 12.1 g/dL (ref 12.0–15.0)
MCH: 31.8 pg (ref 26.0–34.0)
MCH: 32.2 pg (ref 26.0–34.0)
MCHC: 32.6 g/dL (ref 30.0–36.0)
MCHC: 33.2 g/dL (ref 30.0–36.0)
MCV: 97.5 fL (ref 80.0–100.0)
MCV: 96.8 fL (ref 80.0–100.0)
Platelets: 232 10*3/uL (ref 150–400)
Platelets: 209 10*3/uL (ref 150–400)
RBC: 4.06 MIL/uL (ref 3.87–5.11)
RBC: 3.76 MIL/uL — ABNORMAL LOW (ref 3.87–5.11)
RDW: 12.3 % (ref 11.5–15.5)
RDW: 12.3 % (ref 11.5–15.5)
WBC: 5.8 10*3/uL (ref 4.0–10.5)
WBC: 6 10*3/uL (ref 4.0–10.5)
nRBC: 0 % (ref 0.0–0.2)
nRBC: 0 % (ref 0.0–0.2)

## 2023-09-04 LAB — CREATININE, SERUM
Creatinine, Ser: 0.94 mg/dL (ref 0.44–1.00)
GFR, Estimated: >60 mL/min (ref >60)

## 2023-09-04 LAB — APTT: aPTT: 60 s — ABNORMAL HIGH (ref 24–36)

## 2023-09-04 LAB — HEPARIN LEVEL (UNFRACTIONATED): Heparin Unfractionated: 0.45 [IU]/mL (ref 0.30–0.70)

## 2023-09-04 SURGERY — LEFT HEART CATH AND CORONARY ANGIOGRAPHY
Anesthesia: LOCAL

## 2023-09-04 MED ORDER — FENTANYL CITRATE (PF) 100 MCG/2ML IJ SOLN
INTRAMUSCULAR | Status: DC | PRN
  Administered 2023-09-04: 25 ug via INTRAVENOUS

## 2023-09-04 MED ORDER — SODIUM CHLORIDE 0.9% FLUSH
3.0000 mL | INTRAVENOUS | Status: DC | PRN
Start: 2023-09-04 — End: 2023-09-05

## 2023-09-04 MED ORDER — ACETAMINOPHEN 325 MG PO TABS: 650.0000 mg | ORAL_TABLET | ORAL | Status: DC | PRN

## 2023-09-04 MED ORDER — RIVAROXABAN 15 MG PO TABS
15.0000 mg | ORAL_TABLET | Freq: Every day | ORAL | Status: DC
  Administered 2023-09-04: 15 mg via ORAL
  Filled 2023-09-04: qty 1

## 2023-09-04 MED ORDER — HEPARIN SODIUM (PORCINE) 1000 UNIT/ML IJ SOLN
INTRAMUSCULAR | Status: AC
  Filled 2023-09-04: qty 10

## 2023-09-04 MED ORDER — SODIUM CHLORIDE 0.9 % IV SOLN
250.0000 mL | INTRAVENOUS | Status: DC | PRN
Start: 2023-09-04 — End: 2023-09-05

## 2023-09-04 MED ORDER — MIDAZOLAM HCL 2 MG/2ML IJ SOLN
INTRAMUSCULAR | Status: DC | PRN
Start: 2023-09-04 — End: 2023-09-04
  Administered 2023-09-04: 2 mg via INTRAVENOUS

## 2023-09-04 MED ORDER — IOHEXOL 350 MG/ML SOLN
INTRAVENOUS | Status: DC | PRN
Start: 2023-09-04 — End: 2023-09-04
  Administered 2023-09-04: 15 mL

## 2023-09-04 MED ORDER — AMIODARONE HCL 200 MG PO TABS
400.0000 mg | ORAL_TABLET | Freq: Two times a day (BID) | ORAL | Status: DC
  Administered 2023-09-04 – 2023-09-05 (×2): 400 mg via ORAL
  Filled 2023-09-04 (×2): qty 2

## 2023-09-04 MED ORDER — FENTANYL CITRATE (PF) 100 MCG/2ML IJ SOLN
INTRAMUSCULAR | Status: AC
  Filled 2023-09-04: qty 2

## 2023-09-04 MED ORDER — LIDOCAINE HCL (PF) 1 % IJ SOLN
INTRAMUSCULAR | Status: DC | PRN
Start: 2023-09-04 — End: 2023-09-04
  Administered 2023-09-04: 2 mL

## 2023-09-04 MED ORDER — LABETALOL HCL 5 MG/ML IV SOLN
10.0000 mg | INTRAVENOUS | Status: AC | PRN
Start: 2023-09-04 — End: 2023-09-04

## 2023-09-04 MED ORDER — HEPARIN SODIUM (PORCINE) 5000 UNIT/ML IJ SOLN: 5000.0000 [IU] | Freq: Three times a day (TID) | INTRAMUSCULAR | Status: DC

## 2023-09-04 MED ORDER — METOPROLOL SUCCINATE ER 50 MG PO TB24
75.0000 mg | ORAL_TABLET | Freq: Two times a day (BID) | ORAL | Status: DC
  Administered 2023-09-04 – 2023-09-05 (×2): 75 mg via ORAL
  Filled 2023-09-04 (×2): qty 1

## 2023-09-04 MED ORDER — SODIUM CHLORIDE 0.9 % IV SOLN: INTRAVENOUS | Status: AC

## 2023-09-04 MED ORDER — SODIUM CHLORIDE 0.9% FLUSH
3.0000 mL | Freq: Two times a day (BID) | INTRAVENOUS | Status: DC
  Administered 2023-09-04 (×2): 3 mL via INTRAVENOUS

## 2023-09-04 MED ORDER — HEPARIN SODIUM (PORCINE) 1000 UNIT/ML IJ SOLN
INTRAMUSCULAR | Status: DC | PRN
Start: 2023-09-04 — End: 2023-09-04
  Administered 2023-09-04: 3500 [IU] via INTRAVENOUS

## 2023-09-04 MED ORDER — HEPARIN (PORCINE) IN NACL 1000-0.9 UT/500ML-% IV SOLN
INTRAVENOUS | Status: DC | PRN
Start: 2023-09-04 — End: 2023-09-04
  Administered 2023-09-04 (×2): 500 mL

## 2023-09-04 MED ORDER — VERAPAMIL HCL 2.5 MG/ML IV SOLN
INTRAVENOUS | Status: AC
  Filled 2023-09-04: qty 2

## 2023-09-04 MED ORDER — MIDAZOLAM HCL 2 MG/2ML IJ SOLN
INTRAMUSCULAR | Status: AC
  Filled 2023-09-04: qty 2

## 2023-09-04 SURGICAL SUPPLY — 8 items
CATH INFINITI AMBI 5FR TG (CATHETERS) IMPLANT
DEVICE RAD TR BAND REGULAR (VASCULAR PRODUCTS) IMPLANT
GLIDESHEATH SLEND A-KIT 6F 22G (SHEATH) IMPLANT
GUIDEWIRE INQWIRE 1.5J.035X260 (WIRE) IMPLANT
KIT SINGLE USE MANIFOLD (KITS) IMPLANT
KIT SYRINGE INJ CVI SPIKEX1 (MISCELLANEOUS) IMPLANT
PACK CARDIAC CATHETERIZATION (CUSTOM PROCEDURE TRAY) ×2 IMPLANT
SET ATX-X65L (MISCELLANEOUS) IMPLANT

## 2023-09-04 NOTE — Progress Notes (Signed)
 Rounding Note   Patient Name: Brandy [name] Date of Encounter: 09/04/2023  [facility] Cardiologist: Luana Rumple, MD   Subjective Feels well, no post cath symptoms  Scheduled Meds:  atorvastatin   20 mg Oral Daily   fluticasone   1 spray Each Nare Daily   heparin  5,000 Units Subcutaneous Q8H   metoprolol  succinate  75 mg Oral Daily   sertraline   50 mg Oral Daily   sodium chloride  flush  3 mL Intravenous Q12H   Continuous Infusions:  sodium chloride      PRN Meds: sodium chloride , acetaminophen , ALPRAZolam , labetalol, ondansetron  (ZOFRAN ) IV, sodium chloride  flush   Vital Signs  Vitals:   09/04/23 0739 09/04/23 0744 09/04/23 0749 09/04/23 0754  BP: (!) 141/65 (!) 140/61 (!) 123/53 (!) 128/57  Pulse: 70 70 70 78  Resp: (!) 21 13 18  (!) 23  Temp:      TempSrc:      SpO2: 100% 100% 98% 99%  Weight:      Height:        Intake/Output Summary (Last 24 hours) at 09/04/2023 1145 Last data filed at 09/04/2023 4098 Gross per 24 hour  Intake 1088.35 ml  Output --  Net 1088.35 ml      09/04/2023    6:05 AM 09/03/2023    4:29 AM 09/02/2023    4:36 AM  Last 3 Weights  Weight (lbs) 140 lb 3.4 oz 140 lb 8 oz 139 lb 14.4 oz  Weight (kg) 63.6 kg 63.73 kg 63.458 kg      Telemetry AV paced  - Personally Reviewed  ECG  No new EKGs - Personally Reviewed  Physical Exam  GEN: No acute distress.   Neck: No JVD Cardiac: RRR, no murmurs, rubs, or gallops.  Respiratory: Clear to auscultation bilaterally. GI: Soft, nontender, non-distended  MS: No edema; No deformity. Neuro:  Nonfocal  Psych: Normal affect  R wrist: stable, TR band is in place  Labs High Sensitivity Troponin:   Recent Labs  Lab 08/31/23 1618 08/31/23 1810  TROPONINIHS 12 12     Chemistry Recent Labs  Lab 08/31/23 1618 09/01/23 0438 09/04/23 0854  NA 138 143  --   K 4.2 4.2  --   CL 105 106  --   CO2 23 29  --   GLUCOSE 141* 101*  --   BUN 19 14  --   CREATININE 1.01* 0.92 0.94   CALCIUM  9.5 9.2  --   MG 2.4  --   --   PROT 6.4*  --   --   ALBUMIN 3.7  --   --   AST 21  --   --   ALT 18  --   --   ALKPHOS 61  --   --   BILITOT 0.3  --   --   GFRNONAA >60 >60 >60  ANIONGAP 10 8  --     Lipids No results for input(s): "CHOL", "TRIG", "HDL", "LABVLDL", "LDLCALC", "CHOLHDL" in the last 168 hours.  Hematology Recent Labs  Lab 09/03/23 0512 09/04/23 0552 09/04/23 0854  WBC 5.6 5.8 6.0  RBC 4.04 4.06 3.76*  HGB 12.8 12.9 12.1  HCT 39.5 39.6 36.4  MCV 97.8 97.5 96.8  MCH 31.7 31.8 32.2  MCHC 32.4 32.6 33.2  RDW 12.2 12.3 12.3  PLT 215 232 209   Thyroid   Recent Labs  Lab 09/01/23 0438  TSH 3.017    BNPNo results for input(s): "BNP", "PROBNP" in the last 168  hours.  DDimer No results for input(s): "DDIMER" in the last 168 hours.   Radiology    Cardiac Studies   Coronary angiography 09/04/2023: LM: Normal LAD: Normal, no significant disease Lcx: Normal, no significant disease RCA: Normal, no significant disease   LVEDP 18 mmHg   Normal coronary arteries with no angiographically evident coronary artery disease. Ventricular tachycardia is not ischemic in etiology.  09/03/23: c.MRI IMPRESSION: 1. Patchy midwall LGE in basal septal and anterior walls, as well as RV insertion sites. This is a nonischemic scar pattern. Given multifocal nonischemic LGE, and considering clinical history of complete heart block and VT, concerning for sarcoidosis. Recommend FDG-PET to evaluate for cardiac sarcoid, as well as chest CT to evaluate for pulmonary sarcoid.   2. Normal LV size, no hypertrophy, and mild systolic dysfunction (EF 42%). Septal hypokinesis consistent with RV pacing   3.  Normal RV size and systolic function (EF 52%)   09/01/23: TTE 1. Left ventricular ejection fraction, by estimation, is 55 to 60%. The  left ventricle has normal function. The left ventricle has no regional  wall motion abnormalities. Left ventricular diastolic parameters  are  indeterminate. Elevated left atrial  pressure.   2. Right ventricular systolic function is normal. The right ventricular  size is normal. There is normal pulmonary artery systolic pressure.   3. The mitral valve is normal in structure. Trivial mitral valve  regurgitation. No evidence of mitral stenosis.   4. The tricuspid valve is abnormal.   5. The aortic valve is tricuspid. Aortic valve regurgitation is not  visualized. No aortic stenosis is present.   6. The inferior vena cava is normal in size with greater than 50%  respiratory variability, suggesting right atrial pressure of 3 mmHg.     10/03/2017: Coronary CT IMPRESSION: 1. Coronary calcium  score of 0. This was 0 percentile for age and sex matched control.   2. Normal coronary origin with right dominance.   3. Minimal non-obstructive plaque in the proximal LCX and mid LAD arteries.  Patient Profile   68 y.o. female w/PMHx of  HLD CHB w/PPM PVCs AFib  NSVTs over the years >> sustained VT of late noted on her PPM checks out patient  Admitted with recurrent symptomatic sustained VT, lightheaded, dizzy, no syncope   Assessment & Plan   VT  Out patient w/u had been planned > c.MRI, possibly a cardiac stress MRI, perhaps PET with some suspicion of perhaps sarcoid given both she/her sister required pacing young (and fairly low suspicion of obstructive CAD give CT in 2019)  Echo with preserved LVEF C.MRI abnormal (noted above), LVEF by MRI 42% > rec for PET scan to better evaluate for sarcoidosis Cath this morning with no CAD  Home with life vest with plans for  out patient: PET scan PPM RV lead extraction > ICD implant  Given cath this morning and pending life vest, patient and daughter feel best about her going hoe tomorrow Anticipate discharge tomorrow   Paroxysmal AFib CHA2DS2Vasc is 2, on Xarelto  out pt Heparin gtt here >> off for cath d/w Dr. 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

## 2023-09-04 NOTE — Discharge Instructions (Signed)
NO DRIVING 6 MONTHS

## 2023-09-04 NOTE — Progress Notes (Signed)
 Patient had 43 beat run (16 sec) of ventricular tachycardia; notified by central telemetry and responded to patient room.  Patient was asymptomatic and unaware of event. Made Mertha Abrahams, PA-C aware at nursing station.  Will anticipate orders.

## 2023-09-04 NOTE — Interval H&P Note (Signed)
 History and Physical Interval Note:  09/04/2023 7:43 AM  Brandy [name]  has presented today for surgery, with the diagnosis of V-tach.  The various methods of treatment have been discussed with the patient and family. After consideration of risks, benefits and other options for treatment, the patient has consented to  Procedure(s): LEFT HEART CATH AND CORONARY ANGIOGRAPHY (N/A) as a surgical intervention.  The patient's history has been reviewed, patient examined, no change in status, stable for surgery.  I have reviewed the patient's chart and labs.  Questions were answered to the patient's satisfaction.     [name] [name]

## 2023-09-04 NOTE — TOC Initial Note (Signed)
 Transition of Care [facility]) - Initial/Assessment Note    Patient Details  Name: Brandy [name] MRN: [id number] Date of Birth: 1955-05-20  [facility]) CM/SW Contact:    Cosimo Diones, RN Phone Number: 09/04/2023, 3:41 PM  Clinical Narrative: Patient presented for palpitations. EP is following the patient. Life Vest orders submitted to [employer] and insurance has approved. Patient will be fit around 5:30. PTA patient was independent from home. Patient has support of family that lives nearby. No further needs identified at this time.                 Expected Discharge Plan: Home/Self Care Barriers to Discharge: No Barriers Identified   Patient Goals and CMS Choice Patient states their goals for this hospitalization and ongoing recovery are:: Plan to return home once stable.          Expected Discharge Plan and Services In-house Referral: NA Discharge Planning Services: CM Consult Post Acute Care Choice: Durable Medical Equipment Living arrangements for the past 2 months: Single Family Home                 DME Arranged: Life vest DME Agency: [employer] Date DME Agency Contacted: 09/04/23 Time DME Agency Contacted: 1230 Representative spoke with at DME Agency: Sherline Distel HH Arranged: NA          Prior Living Arrangements/Services Living arrangements for the past 2 months: Single Family Home Lives with:: Self Patient language and need for interpreter reviewed:: Yes Do you feel safe going back to the place where you live?: Yes      Need for Family Participation in Patient Care: No (Comment) Care giver support system in place?: No (comment)   Criminal Activity/Legal Involvement Pertinent to Current Situation/Hospitalization: No - Comment as needed  Activities of Daily Living   ADL Screening (condition at time of admission) Independently performs ADLs?: Yes (appropriate for developmental age) Is the patient deaf or have difficulty hearing?: No Does the patient  have difficulty seeing, even when wearing glasses/contacts?: No Does the patient have difficulty concentrating, remembering, or making decisions?: No  Permission Sought/Granted Permission sought to share information with : Family Supports, Case Production designer, theatre/television/film, Photographer granted to share info w AGENCY: [employer]        Emotional Assessment Appearance:: Appears stated age Attitude/Demeanor/Rapport: Engaged Affect (typically observed): Appropriate Orientation: : Oriented to Self, Oriented to Place, Oriented to  Time, Oriented to Situation Alcohol / Substance Use: Not Applicable Psych Involvement: No (comment)  Admission diagnosis:  Ventricular tachycardia (HCC) [I47.20] Patient Active Problem List   Diagnosis Date Noted   Ventricular tachycardia (HCC) 08/31/2023   SSS (sick sinus syndrome) (HCC) 07/28/2020   Pacemaker battery depletion 04/26/2020   Long term (current) use of anticoagulants 12/09/2019   Brief loss of consciousness 11/11/2019   Abnormal cervical Papanicolaou smear 09/02/2019   Asthma 09/02/2019   Depressive disorder 09/02/2019   Foot pain 09/02/2019   Migraine 09/02/2019   Orthostatic hypotension 08/22/2019   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   Osteopenia 08/21/2019   Contact dermatitis 12/02/2018   Hypercholesterolemia 04/01/2018   Decreased GFR 04/01/2018   Healthcare maintenance 02/21/2018   Depression, major, recurrent, moderate (HCC) 02/21/2018   Environmental allergies 10/30/2016   Heart disease 10/30/2016   Presbycusis of both ears 09/19/2016   Tinnitus of both ears 09/19/2016   Paroxysmal atrial fibrillation (HCC) 06/23/2016   Closed fracture of distal clavicle 12/10/2014   CHB (complete heart block) (HCC)  06/09/2013   Pacemaker - Medtronic dual-chamber implanted 2010 06/09/2013   NSVT (nonsustained ventricular tachycardia) (HCC) 06/09/2013   Acute laryngitis and tracheitis 12/15/2012   Acute upper respiratory infection  06/10/2012   Anxiety disorder 06/12/2011   Dysphonia 01/23/2011   RHINOSINUSITIS, ACUTE 12/28/2009   CANDIDIASIS OF THE ESOPHAGUS 10/02/2007   VAGINITIS, RECURRENT 10/02/2007   VAGINAL DISCHARGE 10/02/2007   ASTHMATIC BRONCHITIS, ACUTE 06/05/2007   Seasonal allergic rhinitis 06/05/2007   Vocal cord paralysis 06/05/2007   Esophageal reflux 06/05/2007   PCP:  Victorio Grave, MD Pharmacy:   [facility] - [place], [place] - [address] [place] [address] Phone: [phone] Fax: [phone]     Social Drivers of Health (SDOH) Social History: SDOH Screenings   Food Insecurity: No Food Insecurity (08/31/2023)  Housing: Low Risk  (08/31/2023)  Transportation Needs: No Transportation Needs (08/31/2023)  Utilities: Not At Risk (08/31/2023)  Depression (PHQ2-9): Low Risk  (01/08/2020)  Social Connections: Patient Declined (08/31/2023)  Tobacco Use: Low Risk  (08/31/2023)   SDOH Interventions:     Readmission Risk Interventions     No data to display

## 2023-09-04 NOTE — Plan of Care (Signed)
  Problem: Education: Goal: Knowledge of General Education information will improve Description: Including pain rating scale, medication(s)/side effects and non-pharmacologic comfort measures Outcome: Progressing   Problem: Health Behavior/Discharge Planning: Goal: Ability to manage health-related needs will improve Outcome: Progressing   Problem: Clinical Measurements: Goal: Ability to maintain clinical measurements within normal limits will improve Outcome: Progressing Goal: Will remain free from infection Outcome: Progressing Goal: Diagnostic test results will improve Outcome: Progressing Goal: Respiratory complications will improve Outcome: Progressing Goal: Cardiovascular complication will be avoided Outcome: Progressing   Problem: Activity: Goal: Risk for activity intolerance will decrease Outcome: Progressing   Problem: Nutrition: Goal: Adequate nutrition will be maintained Outcome: Progressing   Problem: Coping: Goal: Level of anxiety will decrease Outcome: Progressing   Problem: Elimination: Goal: Will not experience complications related to bowel motility Outcome: Progressing Goal: Will not experience complications related to urinary retention Outcome: Progressing   Problem: Pain Managment: Goal: General experience of comfort will improve and/or be controlled Outcome: Progressing   Problem: Safety: Goal: Ability to remain free from injury will improve Outcome: Progressing   Problem: Skin Integrity: Goal: Risk for impaired skin integrity will decrease Outcome: Progressing   Problem: Education: Goal: Knowledge of disease or condition will improve Outcome: Progressing Goal: Understanding of medication regimen will improve Outcome: Progressing Goal: Individualized Educational Video(s) Outcome: Progressing   Problem: Activity: Goal: Ability to tolerate increased activity will improve Outcome: Progressing   Problem: Cardiac: Goal: Ability to achieve  and maintain adequate cardiopulmonary perfusion will improve Outcome: Progressing   Problem: Health Behavior/Discharge Planning: Goal: Ability to safely manage health-related needs after discharge will improve Outcome: Progressing   Problem: Education: Goal: Understanding of CV disease, CV risk reduction, and recovery process will improve Outcome: Progressing Goal: Individualized Educational Video(s) Outcome: Progressing   Problem: Activity: Goal: Ability to return to baseline activity level will improve Outcome: Progressing   Problem: Cardiovascular: Goal: Ability to achieve and maintain adequate cardiovascular perfusion will improve Outcome: Progressing Goal: Vascular access site(s) Level 0-1 will be maintained Outcome: Progressing   Problem: Health Behavior/Discharge Planning: Goal: Ability to safely manage health-related needs after discharge will improve Outcome: Progressing

## 2023-09-04 NOTE — Progress Notes (Signed)
 Cath this morning with no obstructive CAD C.MRI abnormal with some suggestion of sarcoidosis, given clinical scenario further w/u for sarcoidosis is recommended and will need to be completed out patient as well as plans for PPM lead/generator extraction with ICD implant as well to be arranged out patient after evaluation for sarcoid is completed.  Dr. Marven Slimmer recommends life vest given her VT until the above can be completed. Patieht is aware and agreeable I have ordered TOC and life vest and reached out to industry to make them aware  If able to get life vest today anticipate discharge this afternoon  Mertha Abrahams, PA-C

## 2023-09-05 ENCOUNTER — Other Ambulatory Visit (HOSPITAL_COMMUNITY): Payer: Self-pay

## 2023-09-05 ENCOUNTER — Other Ambulatory Visit: Payer: Self-pay | Admitting: Physician Assistant

## 2023-09-05 DIAGNOSIS — I472 Ventricular tachycardia, unspecified: Secondary | ICD-10-CM | POA: Diagnosis not present

## 2023-09-05 DIAGNOSIS — I442 Atrioventricular block, complete: Secondary | ICD-10-CM

## 2023-09-05 MED ORDER — AMIODARONE HCL 200 MG PO TABS: 200.0000 mg | ORAL_TABLET | Freq: Every day | ORAL | 5 refills | Status: DC

## 2023-09-05 MED ORDER — AMIODARONE HCL 200 MG PO TABS
ORAL_TABLET | ORAL | 0 refills | Status: DC
  Filled 2023-09-05: qty 84, 28d supply, fill #0

## 2023-09-05 MED ORDER — METOPROLOL SUCCINATE ER 25 MG PO TB24
75.0000 mg | ORAL_TABLET | Freq: Two times a day (BID) | ORAL | 5 refills | Status: DC
  Filled 2023-09-05: qty 180, 30d supply, fill #0
  Filled 2023-11-13: qty 180, 30d supply, fill #1

## 2023-09-05 NOTE — Progress Notes (Signed)
 Pt discharged to home. DC instructions given with daughter at bedside. Pt verbalized no concerns. Pt and dtr encouraged to stop by [facility] pharmacy and pick up meds that were prescribed by Provider. Both verbalized understanding. Pt left unit in wheelchair pushed by hospital volunteer. Left in stable condition.

## 2023-09-05 NOTE — Progress Notes (Signed)
 Remote pacemaker transmission.

## 2023-09-05 NOTE — Discharge Summary (Signed)
 ELECTROPHYSIOLOGY PROCEDURE DISCHARGE SUMMARY    Patient ID: Brandy [name],  MRN: [id number], DOB/AGE: [birth year] 68 y.o.  Admit date: 08/31/2023 Discharge date: 09/05/2023  Primary Care Physician: Victorio Grave, MD  Primary Cardiologist: Dr. Alvis Ba Electrophysiologist: new > Dr. Marven Slimmer  Primary Discharge Diagnosis:  VT Secondary Discharge Diagnosis:  SSSx/CHB Paroxysmal Afib  Allergies  Allergen Reactions   Erythromycin Nausea Only and Dermatitis   Sulfa  Antibiotics Dermatitis, Rash and Other (See Comments)    Urticarial rash developed 1 day after completing a 14 day course of TMP/SMX.   Sulfur Rash and Other (See Comments)    Unknown     Procedures This Admission:  Coronary angiography 09/04/2023: LM: Normal LAD: Normal, no significant disease Lcx: Normal, no significant disease RCA: Normal, no significant disease   LVEDP 18 mmHg   Normal coronary arteries with no angiographically evident coronary artery disease. Ventricular tachycardia is not ischemic in etiology.   Brief HPI: Brandy [name] is a 68 y.o. female is followed by Dr. Alvis Ba out patient, of late noted via PPM remotes/interrogations to have developed some NSVTs > and in Feb sustained VT episode, associated with some weakness, palpitations, planned for w/u and EP evaluation though day of admission devoce alerted for another sustained episode of VT ~2 min nearing 200bpm, she was unaware though perhaps a bit woozy advised to come in  Hospital Course:  The patient was admitted labs largely unremarkable, Xarelt otransitioned to hep gtt for w/u TTE with LVEF 55-60%, no WMA, no significant VHD C.MRI with LVEF 42%, Patchy midwall LGE in basal septal and anterior walls, as well as RV insertion sites. This is a nonischemic scar pattern. Given multifocal nonischemic LGE, and considering clinical history of complete heart block and VT, concerning for sarcoidosis. Recommend FDG-PET to evaluate for  cardiac sarcoid, as well as chest CT to evaluate for pulmonary sarcoid. LHC with no CAD She had on 6/3 a NSVT episode ~16 seconds that she was unaware of, Toprol  increaed and started on amiodarone (suspected not to be long term > until formal diagnois or not of Sarcoid is obtained >  +/- need for steroid/immunosuppression) She was fitted for life vest Planned for out patient PET scan with eventual plan for RV pacing lead extrcation > ICD implant (likely CRT) Cath/radial site stable She was examined by Dr. Marven Slimmer and considered stable for discharge to home.    Amiodarone 400mg  BID for 2 weeks then 400mg  daily for 2 weeks then 200mg  daily Toprol  75mg  BID  EP follow up and PET scheduling is in place/in progress  Physical Exam: Vitals:   09/04/23 2028 09/04/23 2349 09/05/23 0400 09/05/23 0757  BP: (!) 114/54 (!) 120/55 124/70 (!) 142/67  Pulse: 77 71 70   Resp:  16 18   Temp:  98.2 F (36.8 C) 98.4 F (36.9 C) 98.3 F (36.8 C)  TempSrc:  Oral Oral Oral  SpO2:  99% 99%   Weight:   64.4 kg   Height:        GEN- The patient is well appearing, alert and oriented x 3 today.   HEENT: normocephalic, atraumatic; sclera clear, conjunctiva pink; hearing intact; oropharynx clear; neck supple, no JVP Lungs-  CTA b/l, normal work of breathing.  No wheezes, rales, rhonchi Heart- RRR, no murmurs, rubs or gallops, PMI not laterally displaced GI- soft, non-tender, non-distended Extremities- no clubbing, cyanosis, or edema MS- no significant deformity or atrophy Skin- warm and dry, no rash or lesion, L  radial site is stable, no bleeding/hematoma Psych- euthymic mood, full affect Neuro- no gross deficits  Home with Toprol  75mg  BID Amiodarone 400mg  BID x2 weeks then 400mg  daily for 2 weeks, then daily Follow up with EP is in place We ave reached out to [facility] nurse navigator/office teams to ensure PET scan gets scheduled and completed.   Labs:   Lab Results  Component Value Date   WBC  6.0 09/04/2023   HGB 12.1 09/04/2023   HCT 36.4 09/04/2023   MCV 96.8 09/04/2023   PLT 209 09/04/2023    Recent Labs  Lab 08/31/23 1618 09/01/23 0438 09/04/23 0854  NA 138 143  --   K 4.2 4.2  --   CL 105 106  --   CO2 23 29  --   BUN 19 14  --   CREATININE 1.01* 0.92 0.94  CALCIUM  9.5 9.2  --   PROT 6.4*  --   --   BILITOT 0.3  --   --   ALKPHOS 61  --   --   ALT 18  --   --   AST 21  --   --   GLUCOSE 141* 101*  --     Discharge Medications:  Allergies as of 09/05/2023       Reactions   Erythromycin Nausea Only, Dermatitis   Sulfa  Antibiotics Dermatitis, Rash, Other (See Comments)   Urticarial rash developed 1 day after completing a 14 day course of TMP/SMX.   Sulfur Rash, Other (See Comments)   Unknown        Medication List     TAKE these medications    ALPRAZolam  0.25 MG tablet Commonly known as: XANAX  Take 0.25 mg by mouth at bedtime as needed for sleep.   amiodarone 200 MG tablet Commonly known as: PACERONE Take 2 tablets (400 mg total) by mouth 2 (two) times daily for 14 days, THEN 2 tablets (400 mg total) daily for 14 days. Start taking on: September 05, 2023   amiodarone 200 MG tablet Commonly known as: Pacerone Take 1 tablet (200 mg total) by mouth daily. Start taking on: October 03, 2023   atorvastatin  20 MG tablet Commonly known as: LIPITOR TAKE 1 TABLET BY MOUTH DAILY   Calcium  Carbonate-Vitamin D  500-125 MG-UNIT Tabs Take 1 tablet by mouth 2 (two) times daily.   cyanocobalamin  1000 MCG tablet Commonly known as: VITAMIN B12 Take 1,000 mcg by mouth every evening.   fluticasone  50 MCG/ACT nasal spray Commonly known as: FLONASE  USE 1 SPRAY IN EACH NOSTRIL DAILY What changed: additional instructions   metoprolol  succinate 25 MG 24 hr tablet Commonly known as: TOPROL -XL Take 3 tablets (75 mg total) by mouth 2 (two) times daily. Take with or immediately following a meal. What changed:  medication strength See the new instructions.    Rivaroxaban  15 MG Tabs tablet Commonly known as: XARELTO  Take 1 tablet (15 mg total) by mouth daily with supper.   sertraline  50 MG tablet Commonly known as: ZOLOFT  Take 50 mg by mouth daily.   valACYclovir 1000 MG tablet Commonly known as: VALTREX Take 1 tablet by mouth daily as needed (cold sores).               Durable Medical Equipment  (From admission, onward)           Start     Ordered   09/04/23 0901  For home use only DME Vest life vest  Once       Comments:  Dr. Marven Slimmer 3 month wear Ventricular tachycardia   09/04/23 0901            Disposition: Home Discharge Instructions     Diet - low sodium heart healthy   Complete by: As directed    Increase activity slowly   Complete by: As directed         Duration of Discharge Encounter: 15 minutes, APP time  SignedMertha Abrahams, PA-C 09/05/2023 10:45 AM

## 2023-09-05 NOTE — Plan of Care (Signed)

## 2023-09-05 NOTE — Care Management Important Message (Signed)
 Important Message  Patient Details  Name: Brandy [name] MRN: [id number] Date of Birth: Aug 09, 1955   Important Message Given:  Yes - Medicare IM     Janith Melnick 09/05/2023, 11:49 AM

## 2023-09-06 LAB — LIPOPROTEIN A (LPA): Lipoprotein (a): 15.9 nmol/L

## 2023-09-07 ENCOUNTER — Encounter (HOSPITAL_COMMUNITY): Payer: Self-pay | Admitting: Emergency Medicine

## 2023-09-07 MED FILL — Verapamil HCl IV Soln 2.5 MG/ML: INTRAVENOUS | Qty: 2 | Status: AC

## 2023-09-17 DIAGNOSIS — Z950 Presence of cardiac pacemaker: Secondary | ICD-10-CM | POA: Diagnosis not present

## 2023-09-17 DIAGNOSIS — I480 Paroxysmal atrial fibrillation: Secondary | ICD-10-CM | POA: Diagnosis not present

## 2023-09-17 DIAGNOSIS — I442 Atrioventricular block, complete: Secondary | ICD-10-CM | POA: Diagnosis not present

## 2023-09-17 DIAGNOSIS — I495 Sick sinus syndrome: Secondary | ICD-10-CM | POA: Diagnosis not present

## 2023-09-17 DIAGNOSIS — I4729 Other ventricular tachycardia: Secondary | ICD-10-CM | POA: Diagnosis not present

## 2023-09-24 ENCOUNTER — Telehealth (HOSPITAL_COMMUNITY): Payer: Self-pay | Admitting: Emergency Medicine

## 2023-09-24 NOTE — Telephone Encounter (Signed)
 Reaching out to patient to offer assistance regarding upcoming cardiac imaging study; pt verbalizes understanding of appt date/time, parking situation and where to check in, pre-test NPO status and medications ordered, and verified current allergies; name and call back number provided for further questions should they arise Rockwell Alexandria RN Navigator Cardiac Imaging [facility] [phone] office [phone] cell

## 2023-09-25 DIAGNOSIS — L821 Other seborrheic keratosis: Secondary | ICD-10-CM | POA: Diagnosis not present

## 2023-09-25 DIAGNOSIS — D2272 Melanocytic nevi of left lower limb, including hip: Secondary | ICD-10-CM | POA: Diagnosis not present

## 2023-09-25 DIAGNOSIS — L812 Freckles: Secondary | ICD-10-CM | POA: Diagnosis not present

## 2023-09-25 DIAGNOSIS — D2271 Melanocytic nevi of right lower limb, including hip: Secondary | ICD-10-CM | POA: Diagnosis not present

## 2023-09-27 ENCOUNTER — Encounter (HOSPITAL_COMMUNITY)
Admission: RE | Admit: 2023-09-27 | Discharge: 2023-09-27 | Disposition: A | Discharge status: Home / Self Care | Source: Ambulatory Visit | Attending: Physician Assistant | Admitting: Physician Assistant

## 2023-09-27 DIAGNOSIS — I700 Atherosclerosis of aorta: Secondary | ICD-10-CM | POA: Insufficient documentation

## 2023-09-27 DIAGNOSIS — I472 Ventricular tachycardia, unspecified: Secondary | ICD-10-CM | POA: Insufficient documentation

## 2023-09-27 DIAGNOSIS — I442 Atrioventricular block, complete: Secondary | ICD-10-CM | POA: Insufficient documentation

## 2023-09-27 DIAGNOSIS — Z950 Presence of cardiac pacemaker: Secondary | ICD-10-CM | POA: Diagnosis not present

## 2023-09-27 LAB — NM PET CT MYOCARDIAL SARCOIDOSIS
Nuc Stress EF: 44 %
Rest Nuclear Isotope Dose: 16.7 mCi

## 2023-09-27 MED ORDER — FLUDEOXYGLUCOSE F - 18 (FDG) INJECTION
8.0000 | Freq: Once | INTRAVENOUS | Status: AC
  Administered 2023-09-27: 8 via INTRAVENOUS

## 2023-09-27 MED ORDER — RUBIDIUM RB82 GENERATOR (RUBYFILL)
16.7100 | PACK | Freq: Once | INTRAVENOUS | Status: AC
  Administered 2023-09-27: 16.71 via INTRAVENOUS
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
## 2023-09-28 ENCOUNTER — Encounter: Payer: Self-pay | Admitting: Cardiology

## 2023-09-28 ENCOUNTER — Ambulatory Visit: Payer: Self-pay | Admitting: Student

## 2023-09-28 ENCOUNTER — Ambulatory Visit: Attending: Cardiology | Admitting: Cardiology

## 2023-09-28 VITALS — BP 132/73 | HR 77 | Ht 67.0 in | Wt 142.0 lb

## 2023-09-28 DIAGNOSIS — I442 Atrioventricular block, complete: Secondary | ICD-10-CM | POA: Diagnosis not present

## 2023-09-28 DIAGNOSIS — I472 Ventricular tachycardia, unspecified: Secondary | ICD-10-CM | POA: Diagnosis not present

## 2023-09-28 DIAGNOSIS — I495 Sick sinus syndrome: Secondary | ICD-10-CM

## 2023-09-28 NOTE — Patient Instructions (Signed)
 Medication Instructions:  Your physician recommends that you continue on your current medications as directed. Please refer to the Current Medication list given to you today.  *If you need a refill on your cardiac medications before your next appointment, please call your pharmacy*  Lab Work: BMET and CBC - within 30 days of your procedure at any [facility] location   Testing/Procedures: Pacemaker Generator Change and Upgrade - please see instruction letter  Follow-Up: At [facility], you and your health needs are our priority.  As part of our continuing mission to provide you with exceptional heart care, our providers are all part of one team.  This team includes your primary Cardiologist (physician) and Advanced Practice Providers or APPs (Physician Assistants and Nurse Practitioners) who all work together to provide you with the care you need, when you need it.  Your next appointment:   We will call you to arrange your follow up appointments

## 2023-10-01 DIAGNOSIS — F418 Other specified anxiety disorders: Secondary | ICD-10-CM | POA: Diagnosis not present

## 2023-10-01 DIAGNOSIS — I480 Paroxysmal atrial fibrillation: Secondary | ICD-10-CM | POA: Diagnosis not present

## 2023-10-02 ENCOUNTER — Other Ambulatory Visit (HOSPITAL_COMMUNITY)

## 2023-10-03 ENCOUNTER — Other Ambulatory Visit (HOSPITAL_COMMUNITY)

## 2023-10-05 DIAGNOSIS — I480 Paroxysmal atrial fibrillation: Secondary | ICD-10-CM | POA: Diagnosis not present

## 2023-10-15 ENCOUNTER — Encounter: Payer: Self-pay | Admitting: Cardiovascular Disease

## 2023-10-25 ENCOUNTER — Ambulatory Visit: Payer: PPO

## 2023-10-25 ENCOUNTER — Other Ambulatory Visit: Payer: Self-pay | Admitting: *Deleted

## 2023-10-25 DIAGNOSIS — I495 Sick sinus syndrome: Secondary | ICD-10-CM | POA: Diagnosis not present

## 2023-10-25 DIAGNOSIS — I442 Atrioventricular block, complete: Secondary | ICD-10-CM

## 2023-10-25 DIAGNOSIS — I472 Ventricular tachycardia, unspecified: Secondary | ICD-10-CM

## 2023-10-25 LAB — CUP PACEART REMOTE DEVICE CHECK
Battery Remaining Longevity: 76 mo
Battery Voltage: 2.97 V
Brady Statistic AP VP Percent: 99.65 %
Brady Statistic AP VS Percent: 0.01 %
Brady Statistic AS VP Percent: 0.3 %
Brady Statistic AS VS Percent: 0.04 %
Brady Statistic RA Percent Paced: 99.69 %
Brady Statistic RV Percent Paced: 99.95 %
Date Time Interrogation Session: 20250724040602
Implantable Lead Connection Status: 753985
Implantable Lead Connection Status: 753985
Implantable Lead Implant Date: 20100505
Implantable Lead Implant Date: 20100505
Implantable Lead Location: 753859
Implantable Lead Location: 753860
Implantable Lead Model: 5076
Implantable Lead Model: 5076
Implantable Pulse Generator Implant Date: 20220124
Lead Channel Impedance Value: 342 Ohm
Lead Channel Impedance Value: 418 Ohm
Lead Channel Impedance Value: 418 Ohm
Lead Channel Impedance Value: 475 Ohm
Lead Channel Pacing Threshold Amplitude: 0.625 V
Lead Channel Pacing Threshold Amplitude: 0.75 V
Lead Channel Pacing Threshold Pulse Width: 0.4 ms
Lead Channel Pacing Threshold Pulse Width: 0.4 ms
Lead Channel Sensing Intrinsic Amplitude: 1.875 mV
Lead Channel Sensing Intrinsic Amplitude: 1.875 mV
Lead Channel Sensing Intrinsic Amplitude: 9.125 mV
Lead Channel Sensing Intrinsic Amplitude: 9.125 mV
Lead Channel Setting Pacing Amplitude: 2 V
Lead Channel Setting Pacing Amplitude: 2.5 V
Lead Channel Setting Pacing Pulse Width: 0.4 ms
Lead Channel Setting Sensing Sensitivity: 1.2 mV
Zone Setting Status: 755011

## 2023-10-26 ENCOUNTER — Telehealth (HOSPITAL_COMMUNITY): Payer: Self-pay

## 2023-10-26 DIAGNOSIS — E875 Hyperkalemia: Secondary | ICD-10-CM

## 2023-10-26 DIAGNOSIS — Z01812 Encounter for preprocedural laboratory examination: Secondary | ICD-10-CM

## 2023-10-26 LAB — CBC WITH DIFFERENTIAL/PLATELET
Basophils Absolute: 0 x10E3/uL (ref 0.0–0.2)
Basos: 1 %
EOS (ABSOLUTE): 0.2 x10E3/uL (ref 0.0–0.4)
Eos: 2 %
Hematocrit: 39 % (ref 34.0–46.6)
Hemoglobin: 13.2 g/dL (ref 11.1–15.9)
Immature Grans (Abs): 0 x10E3/uL (ref 0.0–0.1)
Immature Granulocytes: 0 %
Lymphocytes Absolute: 1.8 x10E3/uL (ref 0.7–3.1)
Lymphs: 24 %
MCH: 33.3 pg — ABNORMAL HIGH (ref 26.6–33.0)
MCHC: 33.8 g/dL (ref 31.5–35.7)
MCV: 99 fL — ABNORMAL HIGH (ref 79–97)
Monocytes Absolute: 0.6 x10E3/uL (ref 0.1–0.9)
Monocytes: 7 %
Neutrophils Absolute: 4.8 x10E3/uL (ref 1.4–7.0)
Neutrophils: 66 %
Platelets: 246 x10E3/uL (ref 150–450)
RBC: 3.96 x10E6/uL (ref 3.77–5.28)
RDW: 12.8 % (ref 11.7–15.4)
WBC: 7.4 x10E3/uL (ref 3.4–10.8)

## 2023-10-26 LAB — BASIC METABOLIC PANEL WITH GFR
BUN/Creatinine Ratio: 15 (ref 12–28)
BUN: 19 mg/dL (ref 8–27)
CO2: 22 mmol/L (ref 20–29)
Calcium: 9.5 mg/dL (ref 8.7–10.3)
Chloride: 101 mmol/L (ref 96–106)
Creatinine, Ser: 1.31 mg/dL — ABNORMAL HIGH (ref 0.57–1.00)
Glucose: 91 mg/dL (ref 70–99)
Potassium: 5.7 mmol/L — ABNORMAL HIGH (ref 3.5–5.2)
Sodium: 139 mmol/L (ref 134–144)
eGFR: 44 mL/min/1.73 — ABNORMAL LOW (ref >59)

## 2023-10-26 NOTE — Telephone Encounter (Signed)
 Spoke with patient to discuss upcoming procedure.   Confirmed patient is scheduled for a Biventricular implantable cardioverter defibrillator on Friday, August 1 with Dr. Ole Holts. Instructed patient to arrive at the Main Entrance A at [facility]: [address] [place], [place] [address] and check in at Admitting at 1:00 PM.   Labs completed. Discussed results with Jodie Passey, PA-C who stated, Cr is OK. Potassium appears like it does that from time to time too. I would have her get a repeat BMET on Monday; Making sure she's not taking any potassium supplementation and avoid foods or garnishes (like Ms. Dash) high in potassium. Patient voiced understanding.   Any recent signs of acute illness or been started on antibiotics? No Any new medications started? No Any medications to hold? Hold your Xarelto  for 3 days prior to your procedure- last dose will be July 28. Medication instructions:  On the morning of your procedure take all other medications with a sip of water. No eating or drinking after midnight prior to procedure.   The night before your procedure and the morning of your procedure, wash thoroughly with the CHG surgical soap from the neck down, paying special attention to the area where your procedure will be performed.  Advised of plan to stay overnight. You MUST have a responsible adult to drive you home the following day.  Patient verbalized understanding to all instructions provided and agreed to proceed with procedure.

## 2023-10-27 ENCOUNTER — Ambulatory Visit: Payer: Self-pay | Admitting: Cardiovascular Disease

## 2023-10-29 DIAGNOSIS — Z01812 Encounter for preprocedural laboratory examination: Secondary | ICD-10-CM | POA: Diagnosis not present

## 2023-10-29 DIAGNOSIS — E875 Hyperkalemia: Secondary | ICD-10-CM | POA: Diagnosis not present

## 2023-10-30 ENCOUNTER — Ambulatory Visit: Payer: Self-pay | Admitting: Student

## 2023-10-30 LAB — BASIC METABOLIC PANEL WITH GFR
BUN/Creatinine Ratio: 15 (ref 12–28)
BUN: 19 mg/dL (ref 8–27)
CO2: 24 mmol/L (ref 20–29)
Calcium: 9.5 mg/dL (ref 8.7–10.3)
Chloride: 103 mmol/L (ref 96–106)
Creatinine, Ser: 1.24 mg/dL — ABNORMAL HIGH (ref 0.57–1.00)
Glucose: 82 mg/dL (ref 70–99)
Potassium: 5.5 mmol/L — ABNORMAL HIGH (ref 3.5–5.2)
Sodium: 142 mmol/L (ref 134–144)
eGFR: 47 mL/min/1.73 — ABNORMAL LOW (ref >59)

## 2023-10-30 NOTE — Telephone Encounter (Signed)
 Spoke with patient regarding recent lab results, provider recommendations and reinforced to avoid potassium rich foods: Nuts, seeds. Peas, lentils, and lima beans. Whole grain and bran cereals and breads. Fresh fruits and vegetables, such as apricots, avocado, bananas, cantaloupe, kiwi, oranges, tomatoes, asparagus, and potatoes. Juices, such as orange, tomato, and prune. Lean meats, including fish. Milk and milk products, such as yogurt. Salt replacements: Ms. Hollis or NoSalt  Patient verbalized understanding to information provided and agreed to continue to adjust diet.

## 2023-11-01 DIAGNOSIS — F418 Other specified anxiety disorders: Secondary | ICD-10-CM | POA: Diagnosis not present

## 2023-11-01 DIAGNOSIS — D869 Sarcoidosis, unspecified: Secondary | ICD-10-CM | POA: Diagnosis not present

## 2023-11-01 DIAGNOSIS — I429 Cardiomyopathy, unspecified: Secondary | ICD-10-CM | POA: Diagnosis not present

## 2023-11-01 DIAGNOSIS — I480 Paroxysmal atrial fibrillation: Secondary | ICD-10-CM | POA: Diagnosis not present

## 2023-11-01 NOTE — Pre-Procedure Instructions (Signed)
 Instructed patient on the following items: Arrival time 1230 Nothing to eat or drink after midnight No meds AM of procedure Responsible person to drive you home and stay with you for 24 hrs Wash with special soap night before and morning of procedure If on anti-coagulant drug instructions Xarelto - last dose was 7/28

## 2023-11-02 ENCOUNTER — Encounter: Payer: Self-pay | Admitting: Emergency Medicine

## 2023-11-02 ENCOUNTER — Other Ambulatory Visit: Payer: Self-pay

## 2023-11-02 ENCOUNTER — Ambulatory Visit (HOSPITAL_COMMUNITY)
Admission: RE | Admit: 2023-11-02 | Discharge: 2023-11-03 | Disposition: A | Discharge status: Home / Self Care | Attending: Cardiology | Admitting: Cardiology

## 2023-11-02 ENCOUNTER — Ambulatory Visit (HOSPITAL_COMMUNITY): Admission: RE | Disposition: A | Discharge status: Home / Self Care | Source: Home / Self Care | Attending: Cardiology

## 2023-11-02 DIAGNOSIS — Z95810 Presence of automatic (implantable) cardiac defibrillator: Secondary | ICD-10-CM | POA: Diagnosis present

## 2023-11-02 DIAGNOSIS — I495 Sick sinus syndrome: Secondary | ICD-10-CM | POA: Insufficient documentation

## 2023-11-02 DIAGNOSIS — I480 Paroxysmal atrial fibrillation: Secondary | ICD-10-CM | POA: Diagnosis not present

## 2023-11-02 DIAGNOSIS — R55 Syncope and collapse: Secondary | ICD-10-CM | POA: Insufficient documentation

## 2023-11-02 DIAGNOSIS — I482 Chronic atrial fibrillation, unspecified: Secondary | ICD-10-CM

## 2023-11-02 DIAGNOSIS — I5022 Chronic systolic (congestive) heart failure: Secondary | ICD-10-CM | POA: Diagnosis not present

## 2023-11-02 DIAGNOSIS — I472 Ventricular tachycardia, unspecified: Secondary | ICD-10-CM | POA: Insufficient documentation

## 2023-11-02 DIAGNOSIS — Z7901 Long term (current) use of anticoagulants: Secondary | ICD-10-CM | POA: Insufficient documentation

## 2023-11-02 DIAGNOSIS — I442 Atrioventricular block, complete: Secondary | ICD-10-CM | POA: Insufficient documentation

## 2023-11-02 DIAGNOSIS — Z4502 Encounter for adjustment and management of automatic implantable cardiac defibrillator: Secondary | ICD-10-CM | POA: Diagnosis not present

## 2023-11-02 DIAGNOSIS — Z79899 Other long term (current) drug therapy: Secondary | ICD-10-CM | POA: Diagnosis not present

## 2023-11-02 SURGERY — PPM GENERATOR REMOVAL
Anesthesia: LOCAL

## 2023-11-02 MED ORDER — SERTRALINE HCL 50 MG PO TABS
50.0000 mg | ORAL_TABLET | Freq: Every day | ORAL | Status: DC
  Administered 2023-11-03: 50 mg via ORAL
  Filled 2023-11-02: qty 1

## 2023-11-02 MED ORDER — IOHEXOL 350 MG/ML SOLN
INTRAVENOUS | Status: DC | PRN
Start: 2023-11-02 — End: 2023-11-02
  Administered 2023-11-02: 20 mL

## 2023-11-02 MED ORDER — LIDOCAINE HCL (PF) 1 % IJ SOLN
INTRAMUSCULAR | Status: DC | PRN
  Administered 2023-11-02: 60 mL

## 2023-11-02 MED ORDER — SODIUM CHLORIDE 0.9 % IV SOLN
INTRAVENOUS | Status: AC
  Filled 2023-11-02: qty 2

## 2023-11-02 MED ORDER — AMIODARONE HCL 200 MG PO TABS
200.0000 mg | ORAL_TABLET | Freq: Every day | ORAL | Status: DC
  Administered 2023-11-03: 200 mg via ORAL
  Filled 2023-11-02: qty 1

## 2023-11-02 MED ORDER — SODIUM CHLORIDE 0.9 % IV SOLN
80.0000 mg | INTRAVENOUS | Status: AC
  Administered 2023-11-02: 80 mg

## 2023-11-02 MED ORDER — FENTANYL CITRATE (PF) 100 MCG/2ML IJ SOLN
INTRAMUSCULAR | Status: AC
Start: 2023-11-02 — End: 2023-11-02
  Filled 2023-11-02: qty 2

## 2023-11-02 MED ORDER — CEFAZOLIN SODIUM-DEXTROSE 2-4 GM/100ML-% IV SOLN
INTRAVENOUS | Status: AC
  Filled 2023-11-02: qty 100

## 2023-11-02 MED ORDER — CHLORHEXIDINE GLUCONATE 4 % EX SOLN: 4.0000 | Freq: Once | CUTANEOUS | Status: DC

## 2023-11-02 MED ORDER — METOPROLOL SUCCINATE ER 50 MG PO TB24
75.0000 mg | ORAL_TABLET | Freq: Two times a day (BID) | ORAL | Status: DC
  Administered 2023-11-02 – 2023-11-03 (×2): 75 mg via ORAL
  Filled 2023-11-02 (×2): qty 1

## 2023-11-02 MED ORDER — ATORVASTATIN CALCIUM 10 MG PO TABS
20.0000 mg | ORAL_TABLET | Freq: Every day | ORAL | Status: DC
  Administered 2023-11-03: 20 mg via ORAL
  Filled 2023-11-02: qty 2

## 2023-11-02 MED ORDER — LIDOCAINE HCL (PF) 1 % IJ SOLN
INTRAMUSCULAR | Status: AC
Start: 2023-11-02 — End: 2023-11-02
  Filled 2023-11-02: qty 60

## 2023-11-02 MED ORDER — POVIDONE-IODINE 10 % EX SWAB
2.0000 | Freq: Once | CUTANEOUS | Status: AC
  Administered 2023-11-02: 2 via TOPICAL

## 2023-11-02 MED ORDER — MIDAZOLAM HCL 2 MG/2ML IJ SOLN
INTRAMUSCULAR | Status: AC
Start: 2023-11-02 — End: 2023-11-02
  Filled 2023-11-02: qty 2

## 2023-11-02 MED ORDER — CEFAZOLIN SODIUM-DEXTROSE 2-4 GM/100ML-% IV SOLN
2.0000 g | INTRAVENOUS | Status: AC
  Administered 2023-11-02: 2 g via INTRAVENOUS

## 2023-11-02 MED ORDER — SODIUM CHLORIDE 0.9 % IV SOLN: INTRAVENOUS | Status: DC

## 2023-11-02 MED ORDER — MIDAZOLAM HCL 2 MG/2ML IJ SOLN
INTRAMUSCULAR | Status: AC
  Filled 2023-11-02: qty 2

## 2023-11-02 MED ORDER — FENTANYL CITRATE (PF) 100 MCG/2ML IJ SOLN
INTRAMUSCULAR | Status: AC
  Filled 2023-11-02: qty 2

## 2023-11-02 MED ORDER — MIDAZOLAM HCL 5 MG/5ML IJ SOLN
INTRAMUSCULAR | Status: DC | PRN
  Administered 2023-11-02: 1 mg via INTRAVENOUS
  Administered 2023-11-02: 2 mg via INTRAVENOUS

## 2023-11-02 MED ORDER — ACETAMINOPHEN 325 MG PO TABS
325.0000 mg | ORAL_TABLET | ORAL | Status: DC | PRN
  Administered 2023-11-02: 650 mg via ORAL
  Filled 2023-11-02: qty 2

## 2023-11-02 MED ORDER — FENTANYL CITRATE (PF) 100 MCG/2ML IJ SOLN
INTRAMUSCULAR | Status: DC | PRN
  Administered 2023-11-02 (×2): 25 ug via INTRAVENOUS

## 2023-11-02 MED ORDER — ONDANSETRON HCL 4 MG/2ML IJ SOLN: 4.0000 mg | Freq: Four times a day (QID) | INTRAMUSCULAR | Status: DC | PRN

## 2023-11-02 SURGICAL SUPPLY — 16 items
CABLE SURGICAL S-101-97-12 (CABLE) ×2 IMPLANT
CATH ATTAIN COM SURV 6250V-MB2 (CATHETERS) IMPLANT
ICD COBALT XT QUAD CRT DTPA2QQ (ICD Generator) IMPLANT
KIT LEAD END CAP (Cap) IMPLANT
KIT WRENCH (KITS) IMPLANT
LEAD ATTAIN PERFORMA S 4598-88 (Lead) IMPLANT
LEAD SPRINT QUAT SEC 6935M-62 (Lead) IMPLANT
PAD DEFIB RADIO PHYSIO CONN (PAD) ×2 IMPLANT
POUCH AIGIS-R ANTIBACT ICD LRG (Mesh General) IMPLANT
SHEATH 9.5FR PRELUDE SNAP 13 (SHEATH) IMPLANT
SHEATH 9FR PRELUDE SNAP 13 (SHEATH) IMPLANT
SHEATH PROBE COVER 6X72 (BAG) IMPLANT
SLITTER 6232ADJ (MISCELLANEOUS) IMPLANT
TRAY PACEMAKER INSERTION (PACKS) ×2 IMPLANT
WIRE ACUITY WHISPER EDS 4648 (WIRE) IMPLANT
WIRE HI TORQ VERSACORE-J 145CM (WIRE) IMPLANT
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
## 2023-11-03 ENCOUNTER — Ambulatory Visit (HOSPITAL_COMMUNITY)

## 2023-11-03 ENCOUNTER — Encounter (HOSPITAL_COMMUNITY): Payer: Self-pay | Admitting: Cardiology

## 2023-11-03 DIAGNOSIS — I442 Atrioventricular block, complete: Secondary | ICD-10-CM | POA: Diagnosis not present

## 2023-11-03 DIAGNOSIS — Z95810 Presence of automatic (implantable) cardiac defibrillator: Secondary | ICD-10-CM

## 2023-11-03 DIAGNOSIS — R918 Other nonspecific abnormal finding of lung field: Secondary | ICD-10-CM | POA: Diagnosis not present

## 2023-11-03 DIAGNOSIS — Z4502 Encounter for adjustment and management of automatic implantable cardiac defibrillator: Secondary | ICD-10-CM | POA: Diagnosis not present

## 2023-11-03 DIAGNOSIS — R55 Syncope and collapse: Secondary | ICD-10-CM | POA: Diagnosis not present

## 2023-11-03 DIAGNOSIS — Z79899 Other long term (current) drug therapy: Secondary | ICD-10-CM | POA: Diagnosis not present

## 2023-11-03 DIAGNOSIS — J929 Pleural plaque without asbestos: Secondary | ICD-10-CM | POA: Diagnosis not present

## 2023-11-03 DIAGNOSIS — I480 Paroxysmal atrial fibrillation: Secondary | ICD-10-CM | POA: Diagnosis not present

## 2023-11-03 DIAGNOSIS — I5022 Chronic systolic (congestive) heart failure: Secondary | ICD-10-CM | POA: Diagnosis not present

## 2023-11-03 DIAGNOSIS — I495 Sick sinus syndrome: Secondary | ICD-10-CM | POA: Diagnosis not present

## 2023-11-03 DIAGNOSIS — I472 Ventricular tachycardia, unspecified: Secondary | ICD-10-CM | POA: Diagnosis not present

## 2023-11-03 DIAGNOSIS — Z7901 Long term (current) use of anticoagulants: Secondary | ICD-10-CM | POA: Diagnosis not present

## 2023-11-03 MED ORDER — ALPRAZOLAM 0.5 MG PO TABS
0.5000 mg | ORAL_TABLET | Freq: Every evening | ORAL | Status: DC | PRN
  Administered 2023-11-03: 0.5 mg via ORAL
  Filled 2023-11-03: qty 1

## 2023-11-03 MED ORDER — DIPHENHYDRAMINE HCL 25 MG PO CAPS
25.0000 mg | ORAL_CAPSULE | Freq: Every day | ORAL | Status: DC | PRN
  Administered 2023-11-03: 25 mg via ORAL
  Filled 2023-11-03: qty 1

## 2023-11-03 MED ORDER — RIVAROXABAN 15 MG PO TABS: 15.0000 mg | ORAL_TABLET | Freq: Every day | ORAL | Status: DC

## 2023-11-03 NOTE — Discharge Instructions (Addendum)
 Resume Xarelto  on Monday, 8/4  After Your ICD (Implantable Cardiac Defibrillator)      You have a Medtronic ICD  Do not lift your arm above shoulder height for 1 week after your procedure. After 7 days, you may progress as below.     Saturday November 10, 2023  Sunday November 11, 2023 Monday November 12, 2023 Tuesday November 13, 2023   Do not lift, push, pull, or carry anything over 10 pounds with the affected arm until 6 weeks (12/14/23 ) after your procedure.   Monitor your defibrillator site for redness, swelling, and drainage. Call the device clinic at [phone] if you experience these symptoms or fever/chills.  If your incision is closed with Steri-strips or staples:  You may shower 7 days after your procedure and wash your incision with soap and water. Avoid lotions, ointments, or perfumes over your incision until it is well-healed.  You may use a hot tub or a pool AFTER your wound check appointment if the incision is completely closed.  Your ICD  is MRI compatible.  Your ICD is designed to protect you from life threatening heart rhythms. Because of this, you may receive a shock.   1 shock with no symptoms:  Call the office during business hours. 1 shock with symptoms (chest pain, chest pressure, dizziness, lightheadedness, shortness of breath, overall feeling unwell):  Call 911. If you experience 2 or more shocks in 24 hours:  Call 911. If you receive a shock, you should not drive for 6 months per the [place] DMV IF you receive appropriate therapy from your ICD.   ICD Alerts:  Some alerts are vibratory and others beep. These are NOT emergencies. Please call our office to let us  know. If this occurs at night or on weekends, it can wait until the next business day. Send a remote transmission.  If your device is capable of reading fluid status (for heart failure), you will be offered monthly monitoring to review this with you.   Remote monitoring is used to monitor your ICD from home.  This monitoring is scheduled every 91 days by our office. It allows us  to keep an eye on the functioning of your device to ensure it is working properly. You will routinely see your Electrophysiologist annually (more often if necessary).
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
## 2023-11-03 NOTE — Progress Notes (Signed)
  Progress Note  Patient Name: Brandy [name] Date of Encounter: 11/03/2023 [facility] Cardiologist: Jerel Balding, MD   Interval Summary   Upgrade to CRT-D yesterday. No acute overnight events. Patient reports feeling relatively well. No new or acute complaints.   Vital Signs Vitals:   11/02/23 2023 11/02/23 2258 11/03/23 0528 11/03/23 0806  BP: 130/80 103/62 (!) 149/93 (!) 141/85  Pulse: 69 71 70   Resp: 20 18 20 15   Temp: 98 F (36.7 C) 97.9 F (36.6 C) 98.1 F (36.7 C) 97.7 F (36.5 C)  TempSrc: Oral Oral Oral Oral  SpO2: 96% 100% 98%   Weight: 66.3 kg     Height:        Intake/Output Summary (Last 24 hours) at 11/03/2023 0854 Last data filed at 11/02/2023 2030 Gross per 24 hour  Intake 480 ml  Output --  Net 480 ml      11/02/2023    8:23 PM 11/02/2023    1:02 PM 09/28/2023    2:12 PM  Last 3 Weights  Weight (lbs) 146 lb 2.6 oz 143 lb 142 lb  Weight (kg) 66.3 kg 64.864 kg 64.411 kg      Telemetry/ECG  AP / BiV paced - Personally Reviewed  Physical Exam  General: Well developed, in no acute distress.  Neck: No JVD.  Cardiac: Normal rate, regular rhythm. Left chest ICD pocket without hematoma of bleeding.  Resp: Normal work of breathing.  Ext: No edema.  Neuro: No gross focal deficits.  Psych: Normal affect.   Assessment & Plan  Brandy [name] is a 68 year old female with a past medical history notable for CHB s/p pacemaker and now ventricular tachycardia who underwent upgrade of her system to CRT-D on 8/1. RV pacing lead was abandoned.   #. CHB s/p PPM #. VT #. Syncope   Plan:  - CXR with appropriate lead position.  - Device check with appropriate device function and stable lead parameters.  - Usual post implant teaching provided regarding activity restrictions and wound care.  - Follow up in device clinic in ~14 days.  - Tentatively plan to resume Xarelto  in 72 hours.   For questions or updates, please contact  [facility] Please  consult [email or web address] for contact info under       Signed, Fonda Kitty, MD

## 2023-11-04 DIAGNOSIS — I480 Paroxysmal atrial fibrillation: Secondary | ICD-10-CM | POA: Diagnosis not present

## 2023-11-05 ENCOUNTER — Other Ambulatory Visit: Payer: Self-pay

## 2023-11-07 MED FILL — Midazolam HCl Inj 2 MG/2ML (Base Equivalent): INTRAMUSCULAR | Qty: 3 | Status: AC

## 2023-11-08 NOTE — Patient Instructions (Signed)
   After Your ICD (Implantable Cardiac Defibrillator)    Monitor your defibrillator site for redness, swelling, and drainage. Call the device clinic at [phone] if you experience these symptoms or fever/chills.  Your incision was closed with Steri-strips or staples:  You may shower 7 days after your procedure and wash your incision with soap and water. Avoid lotions, ointments, or perfumes over your incision until it is well-healed.  You may use a hot tub or a pool after your wound check appointment if the incision is completely closed.  Do not lift, push or pull greater than 10 pounds with the affected arm until 6 weeks after your procedure. UNTIL AFTER SEPTEMBER 12TH. There are no other restrictions in arm movement after your wound check appointment.  Your ICD is designed to protect you from life threatening heart rhythms. Because of this, you may receive a shock.   1 shock with no symptoms:  Call the office during business hours. 1 shock with symptoms (chest pain, chest pressure, dizziness, lightheadedness, shortness of breath, overall feeling unwell):  Call 911. If you experience 2 or more shocks in 24 hours:  Call 911. If you receive a shock, you should not drive.  [place] [employer] - no driving for 6 months if you receive appropriate therapy from your ICD.   ICD Alerts:  Some alerts are vibratory and others beep. These are NOT emergencies. Please call our office to let us  know. If this occurs at night or on weekends, it can wait until the next business day. Send a remote transmission.  If your device is capable of reading fluid status (for heart failure), you will be offered monthly monitoring to review this with you.   Remote monitoring is used to monitor your ICD from home. This monitoring is scheduled every 91 days by our office. It allows us  to keep an eye on the functioning of your device to ensure it is working properly. You will routinely see your Electrophysiologist annually (more  often if necessary).

## 2023-11-12 ENCOUNTER — Other Ambulatory Visit (HOSPITAL_COMMUNITY): Payer: Self-pay

## 2023-11-13 ENCOUNTER — Other Ambulatory Visit (HOSPITAL_COMMUNITY): Payer: Self-pay

## 2023-11-13 ENCOUNTER — Ambulatory Visit: Attending: Cardiology

## 2023-11-13 DIAGNOSIS — I472 Ventricular tachycardia, unspecified: Secondary | ICD-10-CM

## 2023-11-13 NOTE — Progress Notes (Signed)
 Normal in-clinic CRT-D (multi-lead) check and 2 week post op wound check. Presenting Rhythm: AP/BVP 74. Device site wound well healed, edges approximated, no sign of redness, infection or swelling.  Wound care instructions given and educated on arm movement, lift, push pull restrictions and remote monitoring established.  Patient has requested to be switched from the remote bedside monitor to the App.  Will have our remote specialist CMA reach out to patient to assist with the transfer.      Testing was performed. Thresholds, sensing, impedance trends were stable and no changes were required. Outputs programmed for chronic atrial lead and for acute RV and LV leads were appropriate. No treated arrhythmias. No episodes. Patient BiV pacing 98.3 % of the time. Estimated longevity 6.4 years .  Shock plan reviewed and auditory alerts demonstrated for patient.

## 2023-11-14 ENCOUNTER — Telehealth: Payer: Self-pay

## 2023-11-14 NOTE — Telephone Encounter (Signed)
 Spoke with pt about switching from monitor to the app on her phone. Pt will call us  back and let us  know if she decides to make the switch

## 2023-11-16 DIAGNOSIS — L309 Dermatitis, unspecified: Secondary | ICD-10-CM | POA: Diagnosis not present

## 2023-11-21 DIAGNOSIS — N952 Postmenopausal atrophic vaginitis: Secondary | ICD-10-CM | POA: Diagnosis not present

## 2023-11-21 DIAGNOSIS — F419 Anxiety disorder, unspecified: Secondary | ICD-10-CM | POA: Diagnosis not present

## 2023-11-21 DIAGNOSIS — Z6823 Body mass index (BMI) 23.0-23.9, adult: Secondary | ICD-10-CM | POA: Diagnosis not present

## 2023-11-21 DIAGNOSIS — Z01419 Encounter for gynecological examination (general) (routine) without abnormal findings: Secondary | ICD-10-CM | POA: Diagnosis not present

## 2023-11-21 DIAGNOSIS — Z780 Asymptomatic menopausal state: Secondary | ICD-10-CM | POA: Diagnosis not present

## 2023-11-21 DIAGNOSIS — F32A Depression, unspecified: Secondary | ICD-10-CM | POA: Diagnosis not present

## 2023-12-02 DIAGNOSIS — I480 Paroxysmal atrial fibrillation: Secondary | ICD-10-CM | POA: Diagnosis not present

## 2023-12-02 DIAGNOSIS — F418 Other specified anxiety disorders: Secondary | ICD-10-CM | POA: Diagnosis not present

## 2023-12-04 DIAGNOSIS — I480 Paroxysmal atrial fibrillation: Secondary | ICD-10-CM | POA: Diagnosis not present

## 2023-12-11 ENCOUNTER — Other Ambulatory Visit (HOSPITAL_COMMUNITY): Payer: Self-pay

## 2023-12-17 DIAGNOSIS — M545 Low back pain, unspecified: Secondary | ICD-10-CM | POA: Diagnosis not present

## 2024-01-01 DIAGNOSIS — F418 Other specified anxiety disorders: Secondary | ICD-10-CM | POA: Diagnosis not present

## 2024-01-01 DIAGNOSIS — I480 Paroxysmal atrial fibrillation: Secondary | ICD-10-CM | POA: Diagnosis not present

## 2024-01-03 DIAGNOSIS — I480 Paroxysmal atrial fibrillation: Secondary | ICD-10-CM | POA: Diagnosis not present

## 2024-01-03 NOTE — Progress Notes (Signed)
 Remote PPM Transmission

## 2024-01-14 ENCOUNTER — Telehealth: Payer: Self-pay

## 2024-01-14 NOTE — Telephone Encounter (Signed)
 Transmission received.  Device function WNL.  No episodes/alerts noted.  Per Pt she was walking with her dog when she heard a  brief straight tone.  Advised this normally indicates she was in the vicinity of a magnet.    Advised device working normally.  No concerns.  Await further needs.

## 2024-01-14 NOTE — Telephone Encounter (Signed)
 Call received from Pt.  Per Pt her device alarmed and she is calling in to report it.  Advised Pt to send transmission for review.  Await transmission.

## 2024-01-16 DIAGNOSIS — Z1231 Encounter for screening mammogram for malignant neoplasm of breast: Secondary | ICD-10-CM | POA: Diagnosis not present

## 2024-01-17 DIAGNOSIS — H6122 Impacted cerumen, left ear: Secondary | ICD-10-CM | POA: Diagnosis not present

## 2024-01-17 DIAGNOSIS — F418 Other specified anxiety disorders: Secondary | ICD-10-CM | POA: Diagnosis not present

## 2024-01-17 DIAGNOSIS — Z23 Encounter for immunization: Secondary | ICD-10-CM | POA: Diagnosis not present

## 2024-01-17 DIAGNOSIS — Z136 Encounter for screening for cardiovascular disorders: Secondary | ICD-10-CM | POA: Diagnosis not present

## 2024-01-17 DIAGNOSIS — I442 Atrioventricular block, complete: Secondary | ICD-10-CM | POA: Diagnosis not present

## 2024-01-17 DIAGNOSIS — M85859 Other specified disorders of bone density and structure, unspecified thigh: Secondary | ICD-10-CM | POA: Diagnosis not present

## 2024-01-17 DIAGNOSIS — Z7901 Long term (current) use of anticoagulants: Secondary | ICD-10-CM | POA: Diagnosis not present

## 2024-01-17 DIAGNOSIS — Z950 Presence of cardiac pacemaker: Secondary | ICD-10-CM | POA: Diagnosis not present

## 2024-01-17 DIAGNOSIS — D6869 Other thrombophilia: Secondary | ICD-10-CM | POA: Diagnosis not present

## 2024-01-17 DIAGNOSIS — Z780 Asymptomatic menopausal state: Secondary | ICD-10-CM | POA: Diagnosis not present

## 2024-01-17 DIAGNOSIS — I480 Paroxysmal atrial fibrillation: Secondary | ICD-10-CM | POA: Diagnosis not present

## 2024-01-17 DIAGNOSIS — E039 Hypothyroidism, unspecified: Secondary | ICD-10-CM | POA: Insufficient documentation

## 2024-01-17 DIAGNOSIS — Z0000 Encounter for general adult medical examination without abnormal findings: Secondary | ICD-10-CM | POA: Diagnosis not present

## 2024-01-21 ENCOUNTER — Encounter: Payer: Self-pay | Admitting: Cardiology

## 2024-01-24 ENCOUNTER — Ambulatory Visit: Payer: PPO

## 2024-01-24 DIAGNOSIS — I442 Atrioventricular block, complete: Secondary | ICD-10-CM

## 2024-01-24 LAB — CUP PACEART REMOTE DEVICE CHECK
Battery Remaining Longevity: 63 mo
Battery Voltage: 3.01 V
Brady Statistic RV Percent Paced: 98.91 %
Date Time Interrogation Session: 20251022193631
HighPow Impedance: 61 Ohm
Implantable Lead Connection Status: 753985
Implantable Lead Connection Status: 753985
Implantable Lead Connection Status: 753985
Implantable Lead Implant Date: 20100505
Implantable Lead Implant Date: 20250801
Implantable Lead Implant Date: 20250801
Implantable Lead Location: 753858
Implantable Lead Location: 753859
Implantable Lead Location: 753860
Implantable Lead Model: 4598
Implantable Lead Model: 5076
Implantable Pulse Generator Implant Date: 20250801
Lead Channel Impedance Value: 323 Ohm
Lead Channel Impedance Value: 323 Ohm
Lead Channel Impedance Value: 342 Ohm
Lead Channel Impedance Value: 399 Ohm
Lead Channel Impedance Value: 399 Ohm
Lead Channel Impedance Value: 456 Ohm
Lead Channel Impedance Value: 456 Ohm
Lead Channel Impedance Value: 646 Ohm
Lead Channel Impedance Value: 684 Ohm
Lead Channel Impedance Value: 722 Ohm
Lead Channel Impedance Value: 760 Ohm
Lead Channel Impedance Value: 798 Ohm
Lead Channel Impedance Value: 798 Ohm
Lead Channel Pacing Threshold Amplitude: 0.5 V
Lead Channel Pacing Threshold Amplitude: 0.75 V
Lead Channel Pacing Threshold Amplitude: 3.5 V
Lead Channel Pacing Threshold Pulse Width: 0.4 ms
Lead Channel Pacing Threshold Pulse Width: 0.4 ms
Lead Channel Pacing Threshold Pulse Width: 0.8 ms
Lead Channel Sensing Intrinsic Amplitude: 1.5 mV
Lead Channel Sensing Intrinsic Amplitude: 14.1 mV
Lead Channel Setting Pacing Amplitude: 2 V
Lead Channel Setting Pacing Amplitude: 2 V
Lead Channel Setting Pacing Amplitude: 4 V
Lead Channel Setting Pacing Pulse Width: 0.4 ms
Lead Channel Setting Pacing Pulse Width: 0.8 ms
Lead Channel Setting Sensing Sensitivity: 0.3 mV
Zone Setting Status: 755011
Zone Setting Status: 755011
Zone Setting Status: 755011

## 2024-01-25 NOTE — Progress Notes (Signed)
 Remote PPM Transmission

## 2024-01-28 ENCOUNTER — Ambulatory Visit: Payer: Self-pay | Admitting: Cardiovascular Disease

## 2024-01-29 DIAGNOSIS — R944 Abnormal results of kidney function studies: Secondary | ICD-10-CM | POA: Diagnosis not present

## 2024-01-31 DIAGNOSIS — H524 Presbyopia: Secondary | ICD-10-CM | POA: Diagnosis not present

## 2024-01-31 DIAGNOSIS — Z961 Presence of intraocular lens: Secondary | ICD-10-CM | POA: Diagnosis not present

## 2024-02-01 DIAGNOSIS — I480 Paroxysmal atrial fibrillation: Secondary | ICD-10-CM | POA: Diagnosis not present

## 2024-02-01 DIAGNOSIS — F418 Other specified anxiety disorders: Secondary | ICD-10-CM | POA: Diagnosis not present

## 2024-02-02 DIAGNOSIS — I480 Paroxysmal atrial fibrillation: Secondary | ICD-10-CM | POA: Diagnosis not present

## 2024-02-18 DIAGNOSIS — R944 Abnormal results of kidney function studies: Secondary | ICD-10-CM | POA: Diagnosis not present

## 2024-02-18 NOTE — Progress Notes (Unsigned)
  Electrophysiology Office Follow up Visit Note:    Date:  02/18/2024   ID:  Brandy [name], DOB November 15, 1955, MRN [id number]  PCP:  Teresa Channel, MD  [facility] Cardiologist:  Jerel Balding, MD  [facility] Electrophysiologist:  OLE ONEIDA HOLTS, MD    Interval History:     Brandy [name] is a 68 y.o. female who presents for a follow up visit.   She had an upgrade to CRT-D in August.  Today she is doing okay.  She does report some shortness of breath with exertion.  No signs of heart failure.  No fluid retention.  No changes in her weight.  No syncope or presyncope.  Her incision has healed well.  She was recently started on Synthroid and her primary care team is monitoring her thyroid  function.      Past medical, surgical, social and family history were reviewed.  ROS:   Please see the history of present illness.    All other systems reviewed and are negative.  EKGs/Labs/Other Studies Reviewed:    The following studies were reviewed today:  February 20, 2024 in-clinic device interrogation personally reviewed Battery and lead parameters stable.        Physical Exam:    VS:  There were no vitals taken for this visit.    Wt Readings from Last 3 Encounters:  11/02/23 146 lb 2.6 oz (66.3 kg)  09/28/23 142 lb (64.4 kg)  09/05/23 142 lb (64.4 kg)     GEN: no distress CARD: RRR, No MRG RESP: No IWOB. CTAB.      ASSESSMENT:    No diagnosis found. PLAN:    In order of problems listed above:  #Ventricular tachycardia #High risk med monitoring-amiodarone  Quiescent on amiodarone  and metoprolol  The patient is having repeat blood work done next month with her primary care physician.  Recommend checking CMP, TSH and free T4. Reduce amiodarone  to 100 mg by mouth daily.  #CRT-D in situ NYHA class II-III.  Warm and dry on exam today.  Device functioning appropriately, continue remote monitoring Repeat echocardiogram to reassess LV function post  upgrade  #Atrial fibrillation Continue Xarelto  for stroke prophylaxis  I discussed my upcoming departure from [facility] during today's clinic appointment.  She will continue to follow-up with my partners moving forward.  Follow-up 6 months with Dr. Balding and 1 year with our team.  And likely alternate every 6 months moving forward.   Signed, Ole Holts, MD, [facility], [facility] 02/18/2024 2:46 PM    Electrophysiology [facility]

## 2024-02-20 ENCOUNTER — Encounter: Payer: Self-pay | Admitting: Cardiology

## 2024-02-20 ENCOUNTER — Ambulatory Visit: Attending: Cardiology | Admitting: Cardiology

## 2024-02-20 VITALS — BP 140/86 | HR 75 | Ht 67.0 in | Wt 141.0 lb

## 2024-02-20 DIAGNOSIS — I495 Sick sinus syndrome: Secondary | ICD-10-CM | POA: Diagnosis not present

## 2024-02-20 DIAGNOSIS — I472 Ventricular tachycardia, unspecified: Secondary | ICD-10-CM

## 2024-02-20 DIAGNOSIS — Z95810 Presence of automatic (implantable) cardiac defibrillator: Secondary | ICD-10-CM | POA: Diagnosis not present

## 2024-02-20 DIAGNOSIS — I442 Atrioventricular block, complete: Secondary | ICD-10-CM | POA: Diagnosis not present

## 2024-02-20 LAB — CUP PACEART INCLINIC DEVICE CHECK
Date Time Interrogation Session: 20251119151141
HighPow Impedance: 60 Ohm
Implantable Lead Connection Status: 753985
Implantable Lead Connection Status: 753985
Implantable Lead Connection Status: 753985
Implantable Lead Implant Date: 20100505
Implantable Lead Implant Date: 20250801
Implantable Lead Implant Date: 20250801
Implantable Lead Location: 753858
Implantable Lead Location: 753859
Implantable Lead Location: 753860
Implantable Lead Model: 4598
Implantable Lead Model: 5076
Implantable Pulse Generator Implant Date: 20250801
Lead Channel Impedance Value: 342 Ohm
Lead Channel Impedance Value: 456 Ohm
Lead Channel Impedance Value: 874 Ohm
Lead Channel Pacing Threshold Amplitude: 0.75 V
Lead Channel Pacing Threshold Amplitude: 0.75 V
Lead Channel Pacing Threshold Amplitude: 1.75 V
Lead Channel Pacing Threshold Pulse Width: 0.4 ms
Lead Channel Pacing Threshold Pulse Width: 0.4 ms
Lead Channel Pacing Threshold Pulse Width: 0.8 ms
Lead Channel Sensing Intrinsic Amplitude: 1.8 mV

## 2024-02-20 MED ORDER — AMIODARONE HCL 100 MG PO TABS: 100.0000 mg | ORAL_TABLET | Freq: Every day | ORAL | 3 refills | Status: AC

## 2024-02-20 NOTE — Patient Instructions (Signed)
 Medication Instructions:  Your physician has recommended you make the following change in your medication:  1) DECREASE amiodarone  to 100 mg once daily  *If you need a refill on your cardiac medications before your next appointment, please call your pharmacy*  Testing/Procedures: Echocardiogram  Your physician has requested that you have an echocardiogram. Echocardiography is a painless test that uses sound waves to create images of your heart. It provides your doctor with information about the size and shape of your heart and how well your heart's chambers and valves are working. This procedure takes approximately one hour. There are no restrictions for this procedure. Please do NOT wear cologne, perfume, aftershave, or lotions (deodorant is allowed). Please arrive 15 minutes prior to your appointment time.  Please note: We ask at that you not bring children with you during ultrasound (echo/ vascular) testing. Due to room size and safety concerns, children are not allowed in the ultrasound rooms during exams. Our front office staff cannot provide observation of children in our lobby area while testing is being conducted. An adult accompanying a patient to their appointment will only be allowed in the ultrasound room at the discretion of the ultrasound technician under special circumstances. We apologize for any inconvenience.  Follow-Up: At [facility], you and your health needs are our priority.  As part of our continuing mission to provide you with exceptional heart care, our providers are all part of one team.  This team includes your primary Cardiologist (physician) and Advanced Practice Providers or APPs (Physician Assistants and Nurse Practitioners) who all work together to provide you with the care you need, when you need it.  Your next appointment:   6 months  Provider:   Jerel Balding, MD

## 2024-02-25 ENCOUNTER — Ambulatory Visit: Payer: Self-pay | Admitting: Cardiovascular Disease

## 2024-03-02 DIAGNOSIS — F418 Other specified anxiety disorders: Secondary | ICD-10-CM | POA: Diagnosis not present

## 2024-03-02 DIAGNOSIS — I480 Paroxysmal atrial fibrillation: Secondary | ICD-10-CM | POA: Diagnosis not present

## 2024-03-03 ENCOUNTER — Other Ambulatory Visit: Payer: Self-pay | Admitting: Cardiovascular Disease

## 2024-03-03 DIAGNOSIS — I480 Paroxysmal atrial fibrillation: Secondary | ICD-10-CM

## 2024-03-07 NOTE — Telephone Encounter (Signed)
 Xarelto  15mg  refill request received. Pt is 68 years old, weight-64kg, Crea-1.24 on 10/29/23, last seen by Dr. Cindie on 02/20/24 & Dr. Francyne on 08/01/23, Diagnosis-Afib, CrCl- 42.65 mL/min; Dose is appropriate based on dosing criteria. Will send in refill to requested pharmacy.

## 2024-03-31 ENCOUNTER — Other Ambulatory Visit: Payer: Self-pay | Admitting: Physician Assistant

## 2024-04-01 ENCOUNTER — Ambulatory Visit: Payer: Self-pay | Admitting: Cardiovascular Disease

## 2024-04-01 ENCOUNTER — Ambulatory Visit (HOSPITAL_COMMUNITY)
Admission: RE | Admit: 2024-04-01 | Discharge: 2024-04-01 | Disposition: A | Discharge status: Home / Self Care | Source: Ambulatory Visit | Attending: Cardiovascular Disease | Admitting: Cardiovascular Disease

## 2024-04-01 DIAGNOSIS — D8685 Sarcoid myocarditis: Secondary | ICD-10-CM | POA: Diagnosis not present

## 2024-04-01 LAB — ECHOCARDIOGRAM COMPLETE
Area-P 1/2: 5.42 cm2
MV M vel: 5.91 m/s
MV Peak grad: 139.7 mmHg
Radius: 0.84 cm
S' Lateral: 3.3 cm

## 2024-04-15 ENCOUNTER — Encounter: Payer: Self-pay | Admitting: Cardiovascular Disease

## 2024-04-15 ENCOUNTER — Telehealth (HOSPITAL_BASED_OUTPATIENT_CLINIC_OR_DEPARTMENT_OTHER): Payer: Self-pay

## 2024-04-15 NOTE — Progress Notes (Signed)
 PERIOPERATIVE PRESCRIPTION FOR IMPLANTED CARDIAC DEVICE PROGRAMMING   Patient Information:  Patient: Brandy [name]  MRN: [id number]  Date of Birth: [birth year]  Procedure:  Colonoscopy   Date of Surgery:  Clearance 05/07/24                                  Surgeon:  Dr. Estelita Manas Surgeon's Group or Practice Name:  [facility] Phone number:  [phone] Fax number:  [phone]    Device Information:   Clinic EP Physician:   Dr. Madelyne Croitoru Device Type:  Defibrillator Manufacturer and Phone #:  Medtronic: [phone] Pacemaker Dependent?:  Yes Date of Last Device Check:  02/20/2024         Normal Device Function?:  Yes     Electrophysiologist's Recommendations:   Have magnet available. Provide continuous ECG monitoring when magnet is used or reprogramming is to be performed.  Procedure will likely interfere with device function.  Device should be programmed:  Tachy therapies disabled and Asynchronous pacing during procedure and returned to normal programming after procedure  Per Device Clinic Standing Orders, Brandy [name]  04/15/2024 4:18 PM

## 2024-04-15 NOTE — Telephone Encounter (Signed)
"  ° °  Pre-operative Risk Assessment    Patient Name: Brandy [name]  DOB: 1955-12-02 MRN: [id number]   Date of last office visit: 02/20/2024 with Dr. Cindie Date of next office visit: None  Request for Surgical Clearance    Procedure:  Colonoscopy  Date of Surgery:  Clearance 05/07/24                                 Surgeon:  Dr. Estelita Manas Surgeon's Group or Practice Name:  [facility] Phone number:  [phone] Fax number:  [phone]   Type of Clearance Requested:   - Medical  - Pharmacy:  Hold Rivaroxaban  (Xarelto ) 2 days prior   Type of Anesthesia:  Propofol    Additional requests/questions:  PT HAS A MDT CRT-D  Bonney Patrcia Iverson LITTIE   04/15/2024, 3:52 PM   "

## 2024-04-16 DIAGNOSIS — B001 Herpesviral vesicular dermatitis: Secondary | ICD-10-CM | POA: Insufficient documentation

## 2024-04-16 DIAGNOSIS — D6869 Other thrombophilia: Secondary | ICD-10-CM | POA: Insufficient documentation

## 2024-04-16 DIAGNOSIS — Z9849 Cataract extraction status, unspecified eye: Secondary | ICD-10-CM | POA: Insufficient documentation

## 2024-04-16 NOTE — Telephone Encounter (Signed)
" ° °  Name: Brandy [name]  DOB: [birth year]  MRN: [id number]  Primary Cardiologist: Jerel Balding, MD  Preoperative team, please contact this patient and set up a phone call appointment for further preoperative risk assessment. Please obtain consent and complete medication review. Thank you for your help.  I confirm that guidance regarding antiplatelet and oral anticoagulation therapy has been completed and, if necessary, noted below.  Per office protocol, patient can hold Xarelto  for 2 days prior to procedure. -PharmD  I also confirmed the patient resides in the state of [place] . As per [facility] Medical Board telemedicine laws, the patient must reside in the state in which the provider is licensed.  Saddie GORMAN Cleaves, NP 04/16/2024, 7:49 AM [facility]    "

## 2024-04-16 NOTE — Telephone Encounter (Signed)
 Patient scheduled for pre-op clearance on 04/29/24 with Glendia Ferrier, PA-C.     Patient Consent for Virtual Visit        Brandy [name] has provided verbal consent on 04/16/2024 for a virtual visit (video or telephone).   CONSENT FOR VIRTUAL VISIT FOR:  Brandy [name]  By participating in this virtual visit I agree to the following:  I hereby voluntarily request, consent and authorize [facility] and its employed or contracted physicians, physician assistants, nurse practitioners or other licensed health care professionals (the Practitioner), to provide me with telemedicine health care services (the Services) as deemed necessary by the treating Practitioner. I acknowledge and consent to receive the Services by the Practitioner via telemedicine. I understand that the telemedicine visit will involve communicating with the Practitioner through live audiovisual communication technology and the disclosure of certain medical information by electronic transmission. I acknowledge that I have been given the opportunity to request an in-person assessment or other available alternative prior to the telemedicine visit and am voluntarily participating in the telemedicine visit.  I understand that I have the right to withhold or withdraw my consent to the use of telemedicine in the course of my care at any time, without affecting my right to future care or treatment, and that the Practitioner or I may terminate the telemedicine visit at any time. I understand that I have the right to inspect all information obtained and/or recorded in the course of the telemedicine visit and may receive copies of available information for a reasonable fee.  I understand that some of the potential risks of receiving the Services via telemedicine include:  Delay or interruption in medical evaluation due to technological equipment failure or disruption; Information transmitted may not be sufficient (e.g. poor  resolution of images) to allow for appropriate medical decision making by the Practitioner; and/or  In rare instances, security protocols could fail, causing a breach of personal health information.  Furthermore, I acknowledge that it is my responsibility to provide information about my medical history, conditions and care that is complete and accurate to the best of my ability. I acknowledge that Practitioner's advice, recommendations, and/or decision may be based on factors not within their control, such as incomplete or inaccurate data provided by me or distortions of diagnostic images or specimens that may result from electronic transmissions. I understand that the practice of medicine is not an exact science and that Practitioner makes no warranties or guarantees regarding treatment outcomes. I acknowledge that a copy of this consent can be made available to me via my patient portal [facility] MyChart), or I can request a printed copy by calling the office of [facility].    I understand that my insurance will be billed for this visit.   I have read or had this consent read to me. I understand the contents of this consent, which adequately explains the benefits and risks of the Services being provided via telemedicine.  I have been provided ample opportunity to ask questions regarding this consent and the Services and have had my questions answered to my satisfaction. I give my informed consent for the services to be provided through the use of telemedicine in my medical care

## 2024-04-16 NOTE — Telephone Encounter (Signed)
 Patient with diagnosis of A Fib on Xarelto  for anticoagulation.    Procedure: colonoscopy Date of procedure: 05/07/24   CHA2DS2-VASc Score = 2  This indicates a 2.2% annual risk of stroke. The patient's score is based upon: CHF History: 0 HTN History: 0 Diabetes History: 0 Stroke History: 0 Vascular Disease History: 0 Age Score: 1 Gender Score: 1       CrCl 42 ml/min Platelet count 246k  Patient  has not had an Afib/aflutter ablation in the last 3 months, DCCV within the last 4 weeks or a watchman implanted in the last 45 days   Per office protocol, patient can hold Xarelto  for 2 days prior to procedure.   **This guidance is not considered finalized until pre-operative APP has relayed final recommendations.**

## 2024-04-24 ENCOUNTER — Ambulatory Visit: Payer: PPO

## 2024-04-24 DIAGNOSIS — I442 Atrioventricular block, complete: Secondary | ICD-10-CM

## 2024-04-24 LAB — CUP PACEART REMOTE DEVICE CHECK
Battery Remaining Longevity: 86 mo
Battery Voltage: 3 V
Brady Statistic AP VP Percent: 98.63 %
Brady Statistic AP VS Percent: 0.05 %
Brady Statistic AS VP Percent: 0.71 %
Brady Statistic AS VS Percent: 0.61 %
Brady Statistic RA Percent Paced: 99.14 %
Brady Statistic RV Percent Paced: 99.34 %
Date Time Interrogation Session: 20260121221342
HighPow Impedance: 68 Ohm
Implantable Lead Connection Status: 753985
Implantable Lead Connection Status: 753985
Implantable Lead Connection Status: 753985
Implantable Lead Implant Date: 20100505
Implantable Lead Implant Date: 20250801
Implantable Lead Implant Date: 20250801
Implantable Lead Location: 753858
Implantable Lead Location: 753859
Implantable Lead Location: 753860
Implantable Lead Model: 4598
Implantable Lead Model: 5076
Implantable Pulse Generator Implant Date: 20250801
Lead Channel Impedance Value: 304 Ohm
Lead Channel Impedance Value: 361 Ohm
Lead Channel Impedance Value: 380 Ohm
Lead Channel Impedance Value: 418 Ohm
Lead Channel Impedance Value: 418 Ohm
Lead Channel Impedance Value: 437 Ohm
Lead Channel Impedance Value: 494 Ohm
Lead Channel Impedance Value: 627 Ohm
Lead Channel Impedance Value: 760 Ohm
Lead Channel Impedance Value: 779 Ohm
Lead Channel Impedance Value: 817 Ohm
Lead Channel Impedance Value: 836 Ohm
Lead Channel Impedance Value: 874 Ohm
Lead Channel Pacing Threshold Amplitude: 0.625 V
Lead Channel Pacing Threshold Amplitude: 0.625 V
Lead Channel Pacing Threshold Amplitude: 1.875 V
Lead Channel Pacing Threshold Pulse Width: 0.4 ms
Lead Channel Pacing Threshold Pulse Width: 0.4 ms
Lead Channel Pacing Threshold Pulse Width: 0.8 ms
Lead Channel Sensing Intrinsic Amplitude: 14.1 mV
Lead Channel Sensing Intrinsic Amplitude: 3 mV
Lead Channel Setting Pacing Amplitude: 1.75 V
Lead Channel Setting Pacing Amplitude: 2 V
Lead Channel Setting Pacing Amplitude: 2.5 V
Lead Channel Setting Pacing Pulse Width: 0.4 ms
Lead Channel Setting Pacing Pulse Width: 0.8 ms
Lead Channel Setting Sensing Sensitivity: 0.3 mV
Zone Setting Status: 755011
Zone Setting Status: 755011
Zone Setting Status: 755011

## 2024-04-26 NOTE — Progress Notes (Signed)
 Remote PPM Transmission

## 2024-04-27 ENCOUNTER — Ambulatory Visit: Payer: Self-pay | Admitting: Cardiovascular Disease

## 2024-04-29 ENCOUNTER — Ambulatory Visit

## 2024-04-29 ENCOUNTER — Ambulatory Visit: Attending: Physician Assistant | Admitting: Physician Assistant

## 2024-04-29 DIAGNOSIS — Z01810 Encounter for preprocedural cardiovascular examination: Secondary | ICD-10-CM

## 2024-04-29 NOTE — Telephone Encounter (Signed)
 S/w the pt and we moved her tele preop appt to 04/29/24 as the office opened late today due to inclement weather, we were closed 04/28/24 which left appt being scheduled 05/01/24.   Pt procedure is 05/07/24.

## 2024-04-29 NOTE — Progress Notes (Signed)
"  ° °  Virtual Visit via Telephone Note   Because of Brandy [name] co-morbid illnesses, she is at least at moderate risk for complications without adequate follow up.  This format is felt to be most appropriate for this patient at this time.  Due to technical limitations with video connection (technology), today's appointment will be conducted as an audio only telehealth visit, and Brandy [name] verbally agreed to proceed in this manner.   All issues noted in this document were discussed and addressed.  No physical exam could be performed with this format.  Evaluation Performed:  Preoperative cardiovascular risk assessment _____________   Date:  04/29/2024   Patient ID:  Brandy [name], DOB [birth year], MRN [id number]  Patient Location:  Provider location:  Home Office   Primary Care Provider:  Teresa Channel, MD Primary Cardiologist:  Jerel Balding, MD  Patient Profile  Heart failure with mildly reduced ejection fraction (EF 42% by MRI in June 2025, improved to low normal LVF with EF 50-55% by echocardiogram December 2025) Cardiac MRI (09/2023): Ejection fraction 42%, mild mitral regurgitation FDG PET scan (09/27/2023): Negative for cardiac sarcoidosis Echocardiogram (04/01/2024): Ejection fraction 50-55%, mild left ventricular hypertrophy, mildly elevated pulmonary artery systolic pressure, severe left atrial enlargement, moderate mitral regurgitation Non-ischemic cardiomyopathy Cardiac catheterization (2023-09-04): No coronary artery disease Paroxysmal atrial fibrillation Complete heart block status post pacemaker - s/p upgrade to CRT-D 2025 Ventricular tachycardia (Amiodarone  Rx) Moderate mitral regurgitation Orthostatic hypotension Hyperlipidemia  History of Present Illness    Brandy [name] is a 69 y.o. female who presents via audio/video conferencing for a telehealth visit today.   Pt was last seen in cardiology clinic on 02/20/24 by Dr. Cindie (08/01/23 by Dr.  Balding).  At that time Brandy [name] was doing well.    The patient is now pending colonoscopy on 05/07/24 under conscious sedation.  Since her last visit, she has done well w/o chest pain, shortness of breath, syncope, orthopnea, weight gain or edema. No ICD discharges noted.    Physical Exam  Vital Signs:  Brandy [name] does not have vital signs available for review today. Given telephonic nature of communication, physical exam is limited. AAOx3. NAD. Normal affect.  Speech and respirations are unlabored.  Labs Creatinine (10/29/2023): 1.24   Assessment & Plan    Assessment & Plan Preoperative cardiovascular examination Brandy [name]'s perioperative risk of a major cardiac event is 0.9% according to the Revised Cardiac Risk Index (RCRI).  Therefore, she is at low risk for perioperative complications.   Her functional capacity is good at 4.31 METs according to the Duke Activity Status Index (DASI). Recommendations: According to ACC/AHA guidelines, no further cardiovascular testing needed.  The patient may proceed to surgery at acceptable risk.   Antiplatelet and/or Anticoagulation Recommendations: Xarelto  (Rivaroxaban ) can be held for 2 days prior to surgery.  Please resume post op when felt to be safe.     A copy of this note will be routed to requesting surgeon.  Time:   Today, I have spent 6 minutes with the patient with telehealth technology discussing medical history, symptoms, and management plan.     Brandy Ferrier, PA-C 04/29/2024, 3:08 PM  "

## 2024-04-29 NOTE — Telephone Encounter (Signed)
 Notes faxed to surgeon.  Glendia Ferrier, PA-C  04/29/2024 3:09 PM

## 2024-04-30 ENCOUNTER — Emergency Department (HOSPITAL_COMMUNITY)

## 2024-04-30 ENCOUNTER — Telehealth: Payer: Self-pay | Admitting: Home Health

## 2024-04-30 ENCOUNTER — Emergency Department (HOSPITAL_COMMUNITY)
Admission: EM | Admit: 2024-04-30 | Discharge: 2024-05-01 | Disposition: A | Discharge status: Home / Self Care | Attending: Emergency Medicine | Admitting: Emergency Medicine

## 2024-04-30 ENCOUNTER — Other Ambulatory Visit: Payer: Self-pay

## 2024-04-30 DIAGNOSIS — S2222XA Fracture of body of sternum, initial encounter for closed fracture: Secondary | ICD-10-CM | POA: Insufficient documentation

## 2024-04-30 DIAGNOSIS — Y9301 Activity, walking, marching and hiking: Secondary | ICD-10-CM | POA: Insufficient documentation

## 2024-04-30 DIAGNOSIS — W009XXA Unspecified fall due to ice and snow, initial encounter: Secondary | ICD-10-CM | POA: Insufficient documentation

## 2024-04-30 LAB — CBC WITH DIFFERENTIAL/PLATELET
Abs Immature Granulocytes: 0.04 10*3/uL (ref 0.00–0.07)
Basophils Absolute: 0 10*3/uL (ref 0.0–0.1)
Basophils Relative: 0 %
Eosinophils Absolute: 0.1 10*3/uL (ref 0.0–0.5)
Eosinophils Relative: 1 %
HCT: 37.9 % (ref 36.0–46.0)
Hemoglobin: 12.2 g/dL (ref 12.0–15.0)
Immature Granulocytes: 0 %
Lymphocytes Relative: 18 %
Lymphs Abs: 1.8 10*3/uL (ref 0.7–4.0)
MCH: 31.3 pg (ref 26.0–34.0)
MCHC: 32.2 g/dL (ref 30.0–36.0)
MCV: 97.2 fL (ref 80.0–100.0)
Monocytes Absolute: 0.7 10*3/uL (ref 0.1–1.0)
Monocytes Relative: 7 %
Neutro Abs: 7.3 10*3/uL (ref 1.7–7.7)
Neutrophils Relative %: 74 %
Platelets: 186 10*3/uL (ref 150–400)
RBC: 3.9 MIL/uL (ref 3.87–5.11)
RDW: 12.6 % (ref 11.5–15.5)
WBC: 10 10*3/uL (ref 4.0–10.5)
nRBC: 0 % (ref 0.0–0.2)

## 2024-04-30 NOTE — ED Provider Triage Note (Signed)
 Emergency Medicine Provider Triage Evaluation Note  Brandy [name] , a 69 y.o. female  was evaluated in triage.  Pt complains of fall.  Patient reportedly had mechanical fall around 2 PM at facility on ice and landed directly on her back.  She endorses pain to her sternum since this fall and some pain with deep and elation.  No reported head injury.  She is on blood thinners for history of A-fib and cardiac pacemaker.  Review of Systems  Positive: As above Negative: As above  Physical Exam  BP (!) 142/57 (BP Location: Left Arm)   Pulse 71   Temp 97.9 F (36.6 C)   Resp 18   Ht 5' 7 (1.702 m)   Wt 64 kg   SpO2 96%   BMI 22.10 kg/m  Gen:   Awake, no distress   Resp:  Normal effort. No wheezing, rales, or rhonchi on auscultation of lungs. MSK:   Moves extremities without difficulty.  Central chest wall tenderness to palpation along the sternum.  No palpable deformity.   Other:    Medical Decision Making  Medically screening exam initiated at 8:29 PM.  Appropriate orders placed.  Brandy [name] Brandy [name] was informed that the remainder of the evaluation will be completed by another provider, this initial triage assessment does not replace that evaluation, and the importance of remaining in the ED until their evaluation is complete.     Brandy [name] A, PA-C 04/30/24 2030

## 2024-04-30 NOTE — ED Triage Notes (Signed)
 The pt fell on the ice approx 1400 today  she is c/o chest pain and she fell on her back  did not strike her head breathing is painful  no head injury  she is on blood thinners

## 2024-04-30 NOTE — Telephone Encounter (Signed)
 Patient called after hour line, states she fell today on ice, landed on her back, her back is not hurting, but her mid-sternum chest is hurting significantly, tylenol  not helping,. Advised she go to ER now. She agreed.

## 2024-04-30 NOTE — ED Notes (Signed)
 Pt does have a Medtronic defib and would like to make sure that nothing was wrong with it after the fall due to her having chest pain.

## 2024-05-01 ENCOUNTER — Encounter (HOSPITAL_COMMUNITY): Payer: Self-pay

## 2024-05-01 ENCOUNTER — Emergency Department (HOSPITAL_COMMUNITY)

## 2024-05-01 ENCOUNTER — Ambulatory Visit

## 2024-05-01 LAB — BASIC METABOLIC PANEL WITH GFR
Anion gap: 13 (ref 5–15)
BUN: 21 mg/dL (ref 8–23)
CO2: 26 mmol/L (ref 22–32)
Calcium: 9.4 mg/dL (ref 8.9–10.3)
Chloride: 103 mmol/L (ref 98–111)
Creatinine, Ser: 1.21 mg/dL — ABNORMAL HIGH (ref 0.44–1.00)
GFR, Estimated: 48 mL/min — ABNORMAL LOW
Glucose, Bld: 127 mg/dL — ABNORMAL HIGH (ref 70–99)
Potassium: 4.1 mmol/L (ref 3.5–5.1)
Sodium: 141 mmol/L (ref 135–145)

## 2024-05-01 LAB — TROPONIN T, HIGH SENSITIVITY
Troponin T High Sensitivity: 11 ng/L (ref 0–19)
Troponin T High Sensitivity: 12 ng/L (ref 0–19)

## 2024-05-01 MED ORDER — ONDANSETRON HCL 4 MG/2ML IJ SOLN
4.0000 mg | Freq: Once | INTRAMUSCULAR | Status: AC
  Administered 2024-05-01: 4 mg via INTRAVENOUS
  Filled 2024-05-01: qty 2

## 2024-05-01 MED ORDER — DICLOFENAC SODIUM 1 % EX GEL: 4.0000 g | Freq: Four times a day (QID) | CUTANEOUS | 0 refills | Status: AC

## 2024-05-01 MED ORDER — MORPHINE SULFATE (PF) 4 MG/ML IV SOLN
4.0000 mg | Freq: Once | INTRAVENOUS | Status: AC
  Administered 2024-05-01: 4 mg via INTRAVENOUS
  Filled 2024-05-01: qty 1

## 2024-05-01 MED ORDER — KETOROLAC TROMETHAMINE 15 MG/ML IJ SOLN
15.0000 mg | Freq: Once | INTRAMUSCULAR | Status: AC
  Administered 2024-05-01: 15 mg via INTRAVENOUS
  Filled 2024-05-01: qty 1

## 2024-05-01 MED ORDER — MORPHINE SULFATE 15 MG PO TABS: 7.5000 mg | ORAL_TABLET | ORAL | 0 refills | Status: AC | PRN

## 2024-05-01 NOTE — Discharge Instructions (Signed)
 Use the incentive spirometer 10 minutes out of every hour that you are awake at least for the next couple days.  Continue to use your pillow as a splint to help you take deep breaths cough or sneeze.  Please return for difficulty breathing if you develop a fever or your pain is uncontrolled.  Please try to follow-up with your family doctor in clinic.  Call them today to try and set up an appointment.  Use the gel as prescribed. Also take tylenol  1000mg (2 extra strength) four times a day.   Then take the pain medicine if you feel like you need it. Narcotics do not help with the pain, they only make you care about it less.  You can become addicted to this, people may break into your house to steal it.  It will constipate you.  If you drive under the influence of this medicine you can get a DUI.

## 2024-05-01 NOTE — ED Notes (Signed)
 Patient used incentive spirometer and was able to get to 1250.   Also RN interrogated pacemaker per pt and family request.

## 2024-05-01 NOTE — ED Notes (Signed)
Patient ambulated to restroom and back to room with standby assist.

## 2024-05-01 NOTE — ED Provider Notes (Signed)
 " [place] EMERGENCY DEPARTMENT AT [facility] Provider Note   CSN: [id number] Arrival date & time: 04/30/24  1948     Patient presents with: Brandy [name] is a 69 y.o. female.   69 yo F with a chief complaints of chest pain.  Patient was walking outside and she slipped on the ice and fell on her bottom and then fell back and struck her back.  She complaining mostly of pain to her sternum.  She denies head injury denies loss conscious denies neck pain.  Has been taking Tylenol  while waiting and has not had much improvement.   Fall       Prior to Admission medications  Medication Sig Start Date End Date Taking? Authorizing Provider  ALPRAZolam  (XANAX ) 0.25 MG tablet Take 0.125 mg by mouth daily as needed for sleep.   Yes [provider]  amiodarone  (PACERONE ) 100 MG tablet Take 1 tablet (100 mg total) by mouth daily. Patient taking differently: Take 50 mg by mouth daily. 02/20/24  Yes Cindie Ole DASEN, MD  atorvastatin  (LIPITOR) 20 MG tablet TAKE 1 TABLET BY MOUTH DAILY 03/07/24  Yes Croitoru, Mihai, MD  Calcium  Carbonate-Vitamin D  500-125 MG-UNIT TABS Take 2 tablets by mouth every evening.   Yes [provider]  cyanocobalamin  (VITAMIN B12) 1000 MCG tablet Take 1,000 mcg by mouth daily. 05/11/23  Yes [provider]  diclofenac  Sodium (VOLTAREN ) 1 % GEL Apply 4 g topically 4 (four) times daily. 05/01/24  Yes Emil Share, DO  diphenhydrAMINE  (BENADRYL ) 25 MG tablet Take 25 mg by mouth daily as needed for allergies.   Yes [provider]  levothyroxine (SYNTHROID) 75 MCG tablet Take 75 mcg by mouth daily before breakfast.   Yes [provider]  metoprolol  succinate (TOPROL -XL) 25 MG 24 hr tablet Take 3 tablets (75 mg total) by mouth 2 (two) times daily. Take with or immediately following a meal. 04/01/24  Yes Croitoru, Mihai, MD  morphine  (MSIR) 15 MG tablet Take 0.5 tablets (7.5 mg total) by mouth every 4 (four) hours as  needed. 05/01/24  Yes Emil Share, DO  Rivaroxaban  (XARELTO ) 15 MG TABS tablet TAKE 1 TABLET BY MOUTH DAILY WITH SUPPER 03/07/24  Yes Croitoru, Mihai, MD  sertraline  (ZOLOFT ) 50 MG tablet Take 50 mg by mouth daily. 12/07/20  Yes [provider]  valACYclovir (VALTREX) 1000 MG tablet Take 1 tablet by mouth daily as needed (cold sores). 06/16/16  Yes [provider]  levothyroxine (SYNTHROID) 50 MCG tablet Take 50 mcg by mouth daily before breakfast. Patient not taking: Reported on 05/01/2024 01/18/24   [provider]    Allergies: Erythromycin and Sulfa  antibiotics    Review of Systems  Updated Vital Signs BP (!) 148/64   Pulse 70   Temp (!) 97.4 F (36.3 C) (Oral)   Resp 13   Ht 5' 7 (1.702 m)   Wt 64 kg   SpO2 96%   BMI 22.10 kg/m   Physical Exam Vitals and nursing note reviewed.  Constitutional:      General: She is not in acute distress.    Appearance: She is well-developed. She is not diaphoretic.  HENT:     Head: Normocephalic and atraumatic.  Eyes:     Pupils: Pupils are equal, round, and reactive to light.  Cardiovascular:     Rate and Rhythm: Normal rate and regular rhythm.     Heart sounds: No murmur heard.    No friction rub.  No gallop.  Pulmonary:     Effort: Pulmonary effort is normal.     Breath sounds: No wheezing or rales.  Abdominal:     General: There is no distension.     Palpations: Abdomen is soft.     Tenderness: There is no abdominal tenderness.  Musculoskeletal:        General: Tenderness present.     Cervical back: Normal range of motion and neck supple.     Comments: Tenderness overlying the sternum  Skin:    General: Skin is warm and dry.  Neurological:     Mental Status: She is alert and oriented to person, place, and time.  Psychiatric:        Behavior: Behavior normal.     (all labs ordered are listed, but only abnormal results are displayed) Labs Reviewed  BASIC METABOLIC PANEL WITH GFR - Abnormal; Notable  for the following components:      Result Value   Glucose, Bld 127 (*)    Creatinine, Ser 1.21 (*)    GFR, Estimated 48 (*)    All other components within normal limits  CBC WITH DIFFERENTIAL/PLATELET  TROPONIN T, HIGH SENSITIVITY  TROPONIN T, HIGH SENSITIVITY    EKG: EKG Interpretation Date/Time:  Wednesday April 30 2024 20:20:55 EST Ventricular Rate:  71 PR Interval:  122 QRS Duration:  132 QT Interval:  452 QTC Calculation: 491 R Axis:   266  Text Interpretation: AV dual-paced rhythm Biventricular pacemaker detected Abnormal ECG When compared with ECG of 20-Feb-2024 13:48, No significant change was found Confirmed by Raford Lenis ([id number]) on 04/30/2024 11:38:33 PM  Radiology: [facility] Pelvis 1-2 Views Result Date: 05/01/2024 CLINICAL DATA:  Pain. EXAM: PELVIS - 1-2 VIEW COMPARISON:  05/22/2023 FINDINGS: Single view of the pelvis was obtained. Pelvic bony ring is intact. No gross abnormality to either hip. Nonobstructive bowel gas pattern in the lower abdomen and pelvis. IMPRESSION: No acute abnormality. Electronically Signed   By: Juliene Balder M.D.   On: 05/01/2024 13:44   CT Chest Wo Contrast Result Date: 04/30/2024 EXAM: CT CHEST WITHOUT CONTRAST 04/30/2024 10:52:13 PM TECHNIQUE: CT of the chest was performed without the administration of intravenous contrast. Multiplanar reformatted images are provided for review. Automated exposure control, iterative reconstruction, and/or weight based adjustment of the mA/kV was utilized to reduce the radiation dose to as low as reasonably achievable. COMPARISON: PET CT examination of 09/27/2023. CLINICAL HISTORY: Fall, slipped on ice, chest pain, blunt chest trauma. FINDINGS: MEDIASTINUM: Mild cardiomegaly. 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 No abnormal mass effect upon the adjacent right atrium. The central pulmonary arteries are enlarged in keeping with  changes of pulmonary arterial hypertension. Mild atherosclerotic calcification within the thoracic aorta. The central airways are clear. LYMPH NODES: No mediastinal, hilar or axillary lymphadenopathy. LUNGS AND PLEURA: Biapical pleural parenchymal scarring. Bibasilar fibrotic change and band-like left basilar parenchymal scarring. No focal consolidation or pulmonary edema. No pleural effusion or pneumothorax. SOFT TISSUES/BONES: Angulated transverse fracture of the superior body of the sternum, apex posterior, which is new from prior examination. No associated mediastinal hematoma. UPPER ABDOMEN: Limited images of the upper abdomen demonstrates no acute abnormality. IMPRESSION: 1. Angulated transverse fracture of the superior body of the sternum, apex posterior, new from prior examination. No associated mediastinal hematoma or abnormal mass effect upon the adjacent right atrium. 2. Mild cardiomegaly and left subclavian 3 lead pacemaker in place. 3. Central  pulmonary arteries are enlarged, in keeping with pulmonary arterial hypertension. Electronically signed by: Dorethia Molt MD 04/30/2024 11:14 PM EST RP Workstation: [id number]   DG Chest 2 View Result Date: 04/30/2024 EXAM: 2 VIEW(S) XRAY OF THE CHEST 04/30/2024 08:43:00 PM COMPARISON: 11/03/2023 CLINICAL HISTORY: Sternum pain, fall. FINDINGS: LINES, TUBES AND DEVICES: Left-sided pacing device in place. LUNGS AND PLEURA: Retrocardiac airspace opacity. Stable biapical pleural thickening/scarring. No focal pulmonary opacity. No pleural effusion. No pneumothorax. HEART AND MEDIASTINUM: Cardiomegaly. Aortic atherosclerosis. BONES AND SOFT TISSUES: Acute nondisplaced fracture of the proximal sternum on lateral view - limited evaluation due to overlapping osseous structures and overlying soft tissues. IMPRESSION: 1. Question acute nondisplaced fracture of the proximal sternum on lateral view, with limited evaluation due to overlapping osseous structures and overlying  soft tissues. 2. Retrocardiac airspace opacity. Electronically signed by: Morgane Naveau MD 04/30/2024 08:52 PM EST RP Workstation: [id number]     Procedures   Medications Ordered in the ED  morphine  (PF) 4 MG/ML injection 4 mg (4 mg Intravenous Given 05/01/24 1301)  ondansetron  (ZOFRAN ) injection 4 mg (4 mg Intravenous Given 05/01/24 1257)  ketorolac  (TORADOL ) 15 MG/ML injection 15 mg (15 mg Intravenous Given 05/01/24 1259)                                    Medical Decision Making Amount and/or Complexity of Data Reviewed Radiology: ordered.  Risk Prescription drug management.   69 yo F with a chief complaints of chest pain after a fall.  Patient seen in the MSE process.  CT of the chest was ordered and is positive for sternal fracture.  Patient's pain is not well-controlled currently.  Will attempt to control discomfort.  Patient without any obvious imaging of the back, patient on assessment denies obvious midline back pain.  She says she landed on her buttock and it sounds like she has pain to the ischium bilaterally.  Will obtain a plain film of the pelvis.  Plain film of the pelvis my independent interpretation without obvious fracture.  Patient was able to get up and walk here.  Doing well with incentive spirometer.  She will like to try and go home.  PCP follow-up.  3:32 PM:  I have discussed the diagnosis/risks/treatment options with the patient.  Evaluation and diagnostic testing in the emergency department does not suggest an emergent condition requiring admission or immediate intervention beyond what has been performed at this time.  They will follow up with PCP. We also discussed returning to the ED immediately if new or worsening sx occur. We discussed the sx which are most concerning (e.g., sudden worsening pain, fever, inability to tolerate by mouth) that necessitate immediate return. Medications administered to the patient during their visit and any new prescriptions  provided to the patient are listed below.  Medications given during this visit Medications  morphine  (PF) 4 MG/ML injection 4 mg (4 mg Intravenous Given 05/01/24 1301)  ondansetron  (ZOFRAN ) injection 4 mg (4 mg Intravenous Given 05/01/24 1257)  ketorolac  (TORADOL ) 15 MG/ML injection 15 mg (15 mg Intravenous Given 05/01/24 1259)     The patient appears reasonably screen and/or stabilized for discharge and I doubt any other medical condition or other [facility] requiring further screening, evaluation, or treatment in the ED at this time prior to discharge.        Final diagnoses:  Closed fracture of body of sternum, initial encounter    ED  Discharge Orders          Ordered    morphine  (MSIR) 15 MG tablet  Every 4 hours PRN        05/01/24 1357    diclofenac  Sodium (VOLTAREN ) 1 % GEL  4 times daily        05/01/24 1357               Emil Share, DO 05/01/24 1532  "

## 2024-07-24 ENCOUNTER — Ambulatory Visit

## 2024-10-23 ENCOUNTER — Ambulatory Visit

## 2025-01-22 ENCOUNTER — Ambulatory Visit

## 2025-04-23 ENCOUNTER — Ambulatory Visit

## 2025-07-23 ENCOUNTER — Ambulatory Visit

## 2025-10-22 ENCOUNTER — Ambulatory Visit

## 2026-01-21 ENCOUNTER — Ambulatory Visit

## 2026-04-22 ENCOUNTER — Ambulatory Visit
